# Patient Record
Sex: Female | Born: 1950 | Race: Black or African American | Hispanic: No | Smoking: Former smoker
Health system: Southern US, Community
[De-identification: ages and names within clinical notes are randomized; demographics above are authoritative.]

## PROBLEM LIST (undated history)

## (undated) DIAGNOSIS — E042 Nontoxic multinodular goiter: Secondary | ICD-10-CM

## (undated) DIAGNOSIS — M1990 Unspecified osteoarthritis, unspecified site: Secondary | ICD-10-CM

## (undated) DIAGNOSIS — K5790 Diverticulosis of intestine, part unspecified, without perforation or abscess without bleeding: Secondary | ICD-10-CM

## (undated) DIAGNOSIS — J449 Chronic obstructive pulmonary disease, unspecified: Secondary | ICD-10-CM

## (undated) DIAGNOSIS — F419 Anxiety disorder, unspecified: Secondary | ICD-10-CM

## (undated) DIAGNOSIS — F32A Depression, unspecified: Secondary | ICD-10-CM

## (undated) DIAGNOSIS — R600 Localized edema: Secondary | ICD-10-CM

## (undated) DIAGNOSIS — F329 Major depressive disorder, single episode, unspecified: Secondary | ICD-10-CM

## (undated) DIAGNOSIS — I10 Essential (primary) hypertension: Secondary | ICD-10-CM

## (undated) DIAGNOSIS — K635 Polyp of colon: Secondary | ICD-10-CM

## (undated) DIAGNOSIS — E785 Hyperlipidemia, unspecified: Secondary | ICD-10-CM

## (undated) HISTORY — DX: Polyp of colon: K63.5

## (undated) HISTORY — DX: Unspecified osteoarthritis, unspecified site: M19.90

## (undated) HISTORY — DX: Hyperlipidemia, unspecified: E78.5

## (undated) HISTORY — DX: Nontoxic multinodular goiter: E04.2

## (undated) HISTORY — DX: Chronic obstructive pulmonary disease, unspecified: J44.9

## (undated) HISTORY — PX: PARTIAL HYSTERECTOMY: SHX80

## (undated) HISTORY — DX: Depression, unspecified: F32.A

## (undated) HISTORY — PX: COLONOSCOPY: SHX174

## (undated) HISTORY — DX: Major depressive disorder, single episode, unspecified: F32.9

## (undated) HISTORY — DX: Anxiety disorder, unspecified: F41.9

## (undated) HISTORY — DX: Diverticulosis of intestine, part unspecified, without perforation or abscess without bleeding: K57.90

## (undated) HISTORY — DX: Localized edema: R60.0

---

## 1999-11-27 ENCOUNTER — Encounter: Payer: Self-pay | Admitting: Emergency Medicine

## 1999-11-27 ENCOUNTER — Emergency Department (HOSPITAL_COMMUNITY): Admission: EM | Admit: 1999-11-27 | Discharge: 1999-11-27 | Payer: Self-pay

## 2000-01-10 ENCOUNTER — Other Ambulatory Visit: Admission: RE | Admit: 2000-01-10 | Discharge: 2000-01-10 | Payer: Self-pay | Admitting: Obstetrics and Gynecology

## 2003-06-07 ENCOUNTER — Emergency Department (HOSPITAL_COMMUNITY): Admission: EM | Admit: 2003-06-07 | Discharge: 2003-06-07 | Payer: Self-pay | Admitting: Emergency Medicine

## 2003-06-18 ENCOUNTER — Emergency Department (HOSPITAL_COMMUNITY): Admission: EM | Admit: 2003-06-18 | Discharge: 2003-06-18 | Payer: Self-pay | Admitting: Emergency Medicine

## 2004-03-24 ENCOUNTER — Other Ambulatory Visit: Admission: RE | Admit: 2004-03-24 | Discharge: 2004-03-24 | Payer: Self-pay | Admitting: Gynecology

## 2004-04-06 ENCOUNTER — Ambulatory Visit (HOSPITAL_COMMUNITY): Admission: RE | Admit: 2004-04-06 | Discharge: 2004-04-06 | Payer: Self-pay | Admitting: Gynecology

## 2004-04-10 ENCOUNTER — Inpatient Hospital Stay (HOSPITAL_COMMUNITY): Admission: EM | Admit: 2004-04-10 | Discharge: 2004-04-11 | Payer: Self-pay | Admitting: Psychiatry

## 2004-04-11 ENCOUNTER — Ambulatory Visit: Payer: Self-pay | Admitting: Psychiatry

## 2005-01-10 ENCOUNTER — Ambulatory Visit: Payer: Self-pay | Admitting: Internal Medicine

## 2005-01-19 ENCOUNTER — Ambulatory Visit: Payer: Self-pay | Admitting: *Deleted

## 2005-03-28 ENCOUNTER — Ambulatory Visit: Payer: Self-pay | Admitting: Internal Medicine

## 2005-04-09 ENCOUNTER — Ambulatory Visit (HOSPITAL_COMMUNITY): Admission: RE | Admit: 2005-04-09 | Discharge: 2005-04-09 | Payer: Self-pay | Admitting: Internal Medicine

## 2005-04-24 ENCOUNTER — Ambulatory Visit: Payer: Self-pay | Admitting: Internal Medicine

## 2005-05-31 ENCOUNTER — Encounter: Admission: RE | Admit: 2005-05-31 | Discharge: 2005-05-31 | Payer: Self-pay | Admitting: Internal Medicine

## 2005-07-19 ENCOUNTER — Ambulatory Visit: Payer: Self-pay | Admitting: Family Medicine

## 2005-09-13 ENCOUNTER — Ambulatory Visit: Payer: Self-pay | Admitting: Family Medicine

## 2005-09-13 ENCOUNTER — Encounter: Payer: Self-pay | Admitting: Family Medicine

## 2005-09-20 ENCOUNTER — Ambulatory Visit: Payer: Self-pay | Admitting: Family Medicine

## 2005-12-10 ENCOUNTER — Ambulatory Visit: Payer: Self-pay | Admitting: Family Medicine

## 2005-12-11 ENCOUNTER — Ambulatory Visit (HOSPITAL_COMMUNITY): Admission: RE | Admit: 2005-12-11 | Discharge: 2005-12-11 | Payer: Self-pay | Admitting: Family Medicine

## 2005-12-20 ENCOUNTER — Ambulatory Visit: Payer: Self-pay | Admitting: Internal Medicine

## 2006-02-25 ENCOUNTER — Ambulatory Visit: Payer: Self-pay | Admitting: Internal Medicine

## 2006-04-22 ENCOUNTER — Ambulatory Visit: Payer: Self-pay | Admitting: Family Medicine

## 2006-09-25 ENCOUNTER — Encounter (INDEPENDENT_AMBULATORY_CARE_PROVIDER_SITE_OTHER): Payer: Self-pay | Admitting: *Deleted

## 2006-09-27 ENCOUNTER — Emergency Department (HOSPITAL_COMMUNITY): Admission: EM | Admit: 2006-09-27 | Discharge: 2006-09-27 | Payer: Self-pay | Admitting: Emergency Medicine

## 2006-09-30 ENCOUNTER — Telehealth (INDEPENDENT_AMBULATORY_CARE_PROVIDER_SITE_OTHER): Payer: Self-pay | Admitting: *Deleted

## 2006-10-27 ENCOUNTER — Inpatient Hospital Stay (HOSPITAL_COMMUNITY): Admission: EM | Admit: 2006-10-27 | Discharge: 2006-10-31 | Payer: Self-pay | Admitting: Emergency Medicine

## 2006-11-01 ENCOUNTER — Telehealth (INDEPENDENT_AMBULATORY_CARE_PROVIDER_SITE_OTHER): Payer: Self-pay | Admitting: *Deleted

## 2006-11-04 ENCOUNTER — Encounter (INDEPENDENT_AMBULATORY_CARE_PROVIDER_SITE_OTHER): Payer: Self-pay | Admitting: Nurse Practitioner

## 2006-11-07 DIAGNOSIS — K0520 Aggressive periodontitis, unspecified: Secondary | ICD-10-CM | POA: Insufficient documentation

## 2006-11-07 DIAGNOSIS — E785 Hyperlipidemia, unspecified: Secondary | ICD-10-CM | POA: Insufficient documentation

## 2006-11-08 ENCOUNTER — Ambulatory Visit: Payer: Self-pay | Admitting: Nurse Practitioner

## 2006-11-08 DIAGNOSIS — J40 Bronchitis, not specified as acute or chronic: Secondary | ICD-10-CM | POA: Insufficient documentation

## 2006-11-08 DIAGNOSIS — J45909 Unspecified asthma, uncomplicated: Secondary | ICD-10-CM | POA: Insufficient documentation

## 2007-03-06 ENCOUNTER — Ambulatory Visit: Payer: Self-pay | Admitting: Internal Medicine

## 2007-03-06 DIAGNOSIS — J441 Chronic obstructive pulmonary disease with (acute) exacerbation: Secondary | ICD-10-CM | POA: Insufficient documentation

## 2007-03-11 ENCOUNTER — Ambulatory Visit: Payer: Self-pay | Admitting: Internal Medicine

## 2007-04-04 ENCOUNTER — Encounter (INDEPENDENT_AMBULATORY_CARE_PROVIDER_SITE_OTHER): Payer: Self-pay | Admitting: Family Medicine

## 2007-07-04 ENCOUNTER — Other Ambulatory Visit: Admission: RE | Admit: 2007-07-04 | Discharge: 2007-07-04 | Payer: Self-pay | Admitting: Family Medicine

## 2007-07-18 ENCOUNTER — Ambulatory Visit (HOSPITAL_COMMUNITY): Admission: RE | Admit: 2007-07-18 | Discharge: 2007-07-18 | Payer: Self-pay | Admitting: Family Medicine

## 2008-10-29 ENCOUNTER — Encounter: Admission: RE | Admit: 2008-10-29 | Discharge: 2008-10-29 | Payer: Self-pay | Admitting: Family Medicine

## 2009-05-19 ENCOUNTER — Other Ambulatory Visit: Admission: RE | Admit: 2009-05-19 | Discharge: 2009-05-19 | Payer: Self-pay | Admitting: Family Medicine

## 2009-10-13 LAB — HM COLONOSCOPY

## 2010-01-29 ENCOUNTER — Encounter: Payer: Self-pay | Admitting: Internal Medicine

## 2010-05-23 NOTE — H&P (Signed)
NAMEGAYNELLE, PASTRANA            ACCOUNT NO.:  [id number]   MEDICAL RECORD NO.:  [id number]          PATIENT TYPE:  INP   LOCATION:  1224                         FACILITY:  [facility]   PHYSICIAN:  Beckey Rutter, MD  DATE OF BIRTH:  12/13/1950   DATE OF ADMISSION:  10/27/2006  DATE OF DISCHARGE:                              HISTORY & PHYSICAL   CHIEF COMPLAINT ON PRESENTATION:  Shortness of breath.   PRIMARY CARE PHYSICIAN:  [facility].   HISTORY OF PRESENT ILLNESS:  This is a 60 year old smoker who presented  today with shortness of breath started since morning associated with  cough, slightly productive.  The patient was in her usual state of  health yesterday but since morning, the shortness of breath started and  progressively worsened.  The patient denied fever, headache, nausea,  vomiting, or similar condition.  The patient stated that she has been a  smoker for the most of her adult life.   PAST MEDICAL HISTORY:  As per history, not known to have significant  medical problems.   FAMILY HISTORY:  Noncontributory.   SOCIAL HISTORY:  The patient is a current smoker, and she has been a  smoker for more than 25 years.  She denied drug abuse or ethanol abuse.   MEDICATION:  She is not taking any medications.   ALLERGIES:  NO KNOWN DRUG ALLERGIES.   REVIEW OF SYSTEMS:  Admitted to have mild headache after she started  coughing.  Denied abdominal pain, nausea, or significant loss of weight.   PHYSICAL EXAMINATION:  VITAL SIGNS:  Blood pressure 133/105, heart rate  is 111, respiratory rate 28, temperature is 97.1.  HEENT:  Atraumatic, normocephalic.  Eyes:  PERRL.  Mouth moist.  No  ulcer.  NECK:  Supple.  No JVD.  LUNGS:  Bilateral expiratory and inspiratory wheezes and some  crepitation.  Precordium:  First second heart sounds audible.  No added  sounds.  ABDOMEN:  Soft, nontender.  Bowel sounds present.  EXTREMITIES:  No lower extremity edema.  NEUROLOGIC:  The  patient is alert, oriented, and giving history, moving  all of her extremities spontaneously.   LABORATORY DATA:  Urinalysis showing clear urine, negative nitrates or  leukocyte esterase.  CBG and BMET pending.   Chest x-ray done today, preliminary report reading COPD, no active  cardiopulmonary disease.   ASSESSMENT AND PLAN:  This is 60 year old smoker who comes in today with  severe shortness of breath with evidence of wheezes and crepitation on  examination, likely chronic obstructive pulmonary disease picture.   PLAN:  1. The patient will be admitted for further assessment and management.  2. Will start the patient on Solu-Medrol IV.  3. Will continue the nebulizer treatment.  4. We will start the patient on Avelox.  5. Will consider Lovenox for DVT prophylaxis.  6. Smoking cessation.  The patient is already counseled.  Will      consider in-hospital and a smoke cessation package.      Beckey Rutter, MD  Electronically Signed     EME/MEDQ  D:  10/27/2006  T:  10/28/2006  Job:  [id number] 
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
## 2010-05-26 NOTE — Discharge Summary (Signed)
NAMEOCEAN, KEARLEY            ACCOUNT NO.:  [id number]   MEDICAL RECORD NO.:  [id number]          PATIENT TYPE:  INP   LOCATION:  1343                         FACILITY:  [facility]   PHYSICIAN:  Lonia Blood, M.D.      DATE OF BIRTH:  [birth year]   DATE OF ADMISSION:  10/27/2006  DATE OF DISCHARGE:  10/31/2006                               DISCHARGE SUMMARY   PRIMARY CARE PHYSICIAN:  [facility].   DISCHARGE DIAGNOSES:  1. Acute chronic obstructive pulmonary disease exacerbation.  2. Tobacco abuse.  3. Anxiety disorder.   DISCHARGE MEDICATIONS:  1. Steroid taper with prednisone.  2. Spiriva inhaler one capsule daily.  3. Albuterol MDI as needed.  4. Levaquin 250 mg daily x3 days.   FOLLOW UP:  The patient was to follow up at [facility]  within a week after discharge.  [facility]   PROCEDURE:  Chest x-ray on September 19, that showed no acute  cardiopulmonary disease.   CONSULTATIONS:  None.   HISTORY OF PRESENT ILLNESS:  Please refer to dictated history and  physical on admission by Dr. Tamsen Roers.  In short, however, this is a 60-  year-old female with known tobacco history that presented with severe  shortness of breath.  The patient was using extra muscles for  respiration on admission.  She was found to be slightly hypoxic.  Chest  x-ray was negative for any pneumonia, but the patient was wheezing.  She  was subsequently admitted with a diagnosis of COPD exacerbation.   HOSPITAL COURSE:  Problem 1.  ACUTE CHRONIC OBSTRUCTIVE PULMONARY  DISEASE EXACERBATION:  The patient was admitted, placed on nebulizers,  IV steroids and IV antibiotics.  She seemed to have responded to the  treatment adequately.  By the second day, her wheezing was gone.  She  was counseled also due to some anxiety.  The patient was sent home on  nebulizers steroids and antibiotics.  She was follow up at [facility]  for further management.   Problem 2.  TOBACCO ABUSE:  The patient was counseled given some  nicotine patch in the hospital and she understands she needs to quit for  the sake of her lungs.   Problem 3.  ANXIETY DISORDER:  Again, she was counseled.  She is not on  any specific medications and she seemed to be doing fine.  Further  treatment will be at [facility].      Lonia Blood, M.D.  Electronically Signed     LG/MEDQ  D:  12/01/2006  T:  12/02/2006  Job:  [id number]
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
## 2010-10-18 LAB — LIPID PANEL
Cholesterol: 167
Total CHOL/HDL Ratio: 2.9
VLDL: 5

## 2010-10-18 LAB — URINALYSIS, ROUTINE W REFLEX MICROSCOPIC
Bilirubin Urine: NEGATIVE
Ketones, ur: NEGATIVE
Protein, ur: NEGATIVE
Urobilinogen, UA: 0.2
pH: 5

## 2010-10-18 LAB — BLOOD GAS, ARTERIAL
Acid-base deficit: 4.7 — ABNORMAL HIGH
Delivery systems: POSITIVE
O2 Saturation: 97.4
Patient temperature: 97.6
Patient temperature: 98.6
TCO2: 19.6
TCO2: 23.5
pH, Arterial: 7.288 — ABNORMAL LOW
pO2, Arterial: 97.7

## 2010-10-18 LAB — PROTIME-INR
INR: 1
INR: 1
Prothrombin Time: 13.7
Prothrombin Time: 13.8

## 2010-10-18 LAB — BASIC METABOLIC PANEL
BUN: 9
CO2: 28
Calcium: 9.2
Calcium: 9.4
Creatinine, Ser: 1.02
GFR calc Af Amer: >60
GFR calc non Af Amer: 56 — ABNORMAL LOW
Glucose, Bld: 111 — ABNORMAL HIGH
Potassium: 3.3 — ABNORMAL LOW
Potassium: 4.4

## 2010-10-18 LAB — CBC
Hemoglobin: 11.9 — ABNORMAL LOW
Hemoglobin: 12
Hemoglobin: 13.4
MCHC: 33.8
MCV: 92.5
Platelets: 190
Platelets: 208
Platelets: 214
RBC: 4.29
RDW: 15 — ABNORMAL HIGH

## 2010-10-18 LAB — CULTURE, BLOOD (ROUTINE X 2): Culture: NO GROWTH

## 2010-10-18 LAB — MAGNESIUM: Magnesium: 1.8

## 2014-04-22 ENCOUNTER — Other Ambulatory Visit: Payer: Self-pay | Admitting: Family Medicine

## 2014-04-22 DIAGNOSIS — E049 Nontoxic goiter, unspecified: Secondary | ICD-10-CM | POA: Insufficient documentation

## 2014-04-22 DIAGNOSIS — E010 Iodine-deficiency related diffuse (endemic) goiter: Secondary | ICD-10-CM

## 2014-04-30 ENCOUNTER — Ambulatory Visit
Admission: RE | Admit: 2014-04-30 | Discharge: 2014-04-30 | Disposition: A | Discharge status: Home / Self Care | Payer: Self-pay | Source: Ambulatory Visit | Attending: Family Medicine | Admitting: Family Medicine

## 2014-04-30 DIAGNOSIS — E010 Iodine-deficiency related diffuse (endemic) goiter: Secondary | ICD-10-CM

## 2014-05-21 ENCOUNTER — Ambulatory Visit (INDEPENDENT_AMBULATORY_CARE_PROVIDER_SITE_OTHER): Payer: BLUE CROSS/BLUE SHIELD | Admitting: Internal Medicine

## 2014-05-21 ENCOUNTER — Encounter: Payer: Self-pay | Admitting: Internal Medicine

## 2014-05-21 VITALS — BP 108/68 | HR 72 | Temp 98.2°F | Resp 12 | Ht 67.0 in | Wt 169.2 lb

## 2014-05-21 DIAGNOSIS — E042 Nontoxic multinodular goiter: Secondary | ICD-10-CM
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
## 2014-05-21 NOTE — Patient Instructions (Signed)
Please schedule the thyroid biopsies in [facility].  Please return in 1 year.  Thyroid Biopsy The thyroid gland is a butterfly-shaped gland situated in the front of the neck. It produces hormones which affect metabolism, growth and development, and body temperature. A thyroid biopsy is a procedure in which small samples of tissue or fluid are removed from the thyroid gland or mass and examined under a microscope. This test is done to determine the cause of thyroid problems, such as infection, cancer, or other thyroid problems. There are 2 ways to obtain samples: 1. Fine needle biopsy. Samples are removed using a thin needle inserted through the skin and into the thyroid gland or mass. 2. Open biopsy. Samples are removed after a cut (incision) is made through the skin. LET YOUR CAREGIVER KNOW ABOUT:   Allergies.  Medications taken including herbs, eye drops, over-the-counter medications, and creams.  Use of steroids (by mouth or creams).  Previous problems with anesthetics or numbing medicine.  Possibility of pregnancy, if this applies.  History of blood clots (thrombophlebitis).  History of bleeding or blood problems.  Previous surgery.  Other health problems. RISKS AND COMPLICATIONS  Bleeding from the site. The risk of bleeding is higher if you have a bleeding disorder or are taking any blood thinning medications (anticoagulants).  Infection.  Injury to structures near the thyroid gland. BEFORE THE PROCEDURE  This is a procedure that can be done as an outpatient. Confirm the time that you need to arrive for your procedure. Confirm whether there is a need to fast or withhold any medications. A blood sample may be done to determine your blood clotting time. Medicine may be given to help you relax (sedative). PROCEDURE Fine needle biopsy. You will be awake during the procedure. You may be asked to lie on your back with your head tipped backward to extend your neck. Let  your caregiver know if you cannot tolerate the positioning. An area on your neck will be cleansed. A needle is inserted through the skin of your neck. You may feel a mild discomfort during this procedure. You may be asked to avoid coughing, talking, swallowing, or making sounds during some portions of the procedure. The needle is withdrawn once tissue or fluid samples have been removed. Pressure may be applied to the neck to reduce swelling and ensure that bleeding has stopped. The samples will be sent for examination.  Open biopsy. You will be given general anesthesia. You will be asleep during the procedure. An incision is made in your neck. A sample of thyroid tissue or the mass is removed. The tissue sample or mass will be sent for examination. The sample or mass may be examined during the biopsy. If the sample or mass contains cancer cells, some or all of the thyroid gland may be removed. The incision is closed with stitches. AFTER THE PROCEDURE  Your recovery will be assessed and monitored. If there are no problems, as an outpatient, you should be able to go home shortly after the procedure. If you had a fine needle biopsy:  You may have soreness at the biopsy site for 1 to 2 days. If you had an open biopsy:   You may have soreness at the biopsy site for 3 to 4 days.  You may have a hoarse voice or sore throat for 1 to 2 days. Obtaining the Test Results It is your responsibility to obtain your test results. Do not assume everything is normal if you have not heard  from your caregiver or the medical facility. It is important for you to follow up on all of your test results. HOME CARE INSTRUCTIONS   Keeping your head raised on a pillow when you are lying down may ease biopsy site discomfort.  Supporting the back of your head and neck with both hands as you sit up from a lying position may ease biopsy site discomfort.  Only take over-the-counter or prescription medicines for pain, discomfort,  or fever as directed by your caregiver.  Throat lozenges or gargling with warm salt water may help to soothe a sore throat. SEEK IMMEDIATE MEDICAL CARE IF:   You have severe bleeding from the biopsy site.  You have difficulty swallowing.  You have a fever.  You have increased pain, swelling, redness, or warmth at the biopsy site.  You notice pus coming from the biopsy site.  You have swollen glands (lymph nodes) in your neck. Document Released: 10/22/2006 Document Revised: 04/21/2012 Document Reviewed: 03/19/2013 [facility] Patient Information 2015 [place], [place]. This information is not intended to replace advice given to you by your health care provider. Make sure you discuss any questions you have with your health care provider.

## 2014-05-24 ENCOUNTER — Telehealth: Payer: Self-pay | Admitting: Internal Medicine

## 2014-05-24 NOTE — Telephone Encounter (Signed)
Please read message below and advise.  

## 2014-05-24 NOTE — Telephone Encounter (Signed)
She has thyroid Bxs at the end of the mo >> I will d/w her after results are back

## 2014-05-24 NOTE — Telephone Encounter (Signed)
Called pt and advised her per Dr Gherghe's message below. Pt voiced understanding.  

## 2014-05-24 NOTE — Telephone Encounter (Signed)
Pt is questioning enlarged thyroid biopsy in one year what is she supposed to do about the enlarged thyroid. What are the next steps over the next year?

## 2014-06-08 ENCOUNTER — Other Ambulatory Visit (HOSPITAL_COMMUNITY)
Admission: RE | Admit: 2014-06-08 | Discharge: 2014-06-08 | Disposition: A | Discharge status: Home / Self Care | Payer: BLUE CROSS/BLUE SHIELD | Source: Ambulatory Visit | Attending: Interventional Radiology | Admitting: Interventional Radiology

## 2014-06-08 ENCOUNTER — Ambulatory Visit
Admission: RE | Admit: 2014-06-08 | Discharge: 2014-06-08 | Disposition: A | Discharge status: Home / Self Care | Payer: BLUE CROSS/BLUE SHIELD | Source: Ambulatory Visit | Attending: Internal Medicine | Admitting: Internal Medicine

## 2014-06-08 DIAGNOSIS — E041 Nontoxic single thyroid nodule: Secondary | ICD-10-CM | POA: Insufficient documentation

## 2014-06-08 DIAGNOSIS — E042 Nontoxic multinodular goiter: Secondary | ICD-10-CM

## 2014-06-09 NOTE — Addendum Note (Signed)
Addended by: Philemon Kingdom on: 06/09/2014 07:26 PM   Modules accepted: Level of Service

## 2014-06-10 ENCOUNTER — Telehealth: Payer: Self-pay | Admitting: *Deleted

## 2014-06-10 NOTE — Telephone Encounter (Signed)
Pt called back after hearing her voice message about her bx results (benign). Pt stated that her thyroid nodule is enlarging, she questioned when should she be concerned and what should she do at this point? Please advise.

## 2014-06-10 NOTE — Telephone Encounter (Signed)
For a thyroid nodule that is not cancerous, we can consider surgery, but this is a subjective decision, as there is no medical indication to take it out now. If she believes that the nodule is impairing her swallowing or breathing, we can definitely refer her to surgery.

## 2014-06-10 NOTE — Telephone Encounter (Signed)
Called pt and advised her per Dr Arman Filter message. Pt voiced that she would wait until she felt that it was an issue. Be advised.

## 2015-04-25 DIAGNOSIS — J309 Allergic rhinitis, unspecified: Secondary | ICD-10-CM | POA: Diagnosis not present

## 2015-04-25 DIAGNOSIS — E7800 Pure hypercholesterolemia, unspecified: Secondary | ICD-10-CM | POA: Diagnosis not present

## 2015-04-25 DIAGNOSIS — J449 Chronic obstructive pulmonary disease, unspecified: Secondary | ICD-10-CM | POA: Diagnosis not present

## 2015-04-25 DIAGNOSIS — Z23 Encounter for immunization: Secondary | ICD-10-CM | POA: Diagnosis not present

## 2015-04-25 DIAGNOSIS — Z0000 Encounter for general adult medical examination without abnormal findings: Secondary | ICD-10-CM | POA: Diagnosis not present

## 2015-04-25 DIAGNOSIS — E042 Nontoxic multinodular goiter: Secondary | ICD-10-CM | POA: Diagnosis not present

## 2015-05-09 ENCOUNTER — Other Ambulatory Visit: Payer: Self-pay

## 2015-05-10 DIAGNOSIS — Z1231 Encounter for screening mammogram for malignant neoplasm of breast: Secondary | ICD-10-CM | POA: Diagnosis not present

## 2015-05-23 ENCOUNTER — Ambulatory Visit: Payer: Self-pay | Admitting: Internal Medicine

## 2015-05-23 DIAGNOSIS — Z0289 Encounter for other administrative examinations: Secondary | ICD-10-CM

## 2015-08-08 DIAGNOSIS — H5711 Ocular pain, right eye: Secondary | ICD-10-CM | POA: Diagnosis not present

## 2015-08-08 DIAGNOSIS — M79629 Pain in unspecified upper arm: Secondary | ICD-10-CM | POA: Diagnosis not present

## 2015-09-26 DIAGNOSIS — H524 Presbyopia: Secondary | ICD-10-CM | POA: Diagnosis not present

## 2015-09-26 DIAGNOSIS — H2513 Age-related nuclear cataract, bilateral: Secondary | ICD-10-CM | POA: Diagnosis not present

## 2015-10-14 DIAGNOSIS — Z23 Encounter for immunization: Secondary | ICD-10-CM | POA: Diagnosis not present

## 2016-02-06 ENCOUNTER — Ambulatory Visit
Admission: RE | Admit: 2016-02-06 | Discharge: 2016-02-06 | Disposition: A | Discharge status: Home / Self Care | Payer: Medicare Other | Source: Ambulatory Visit | Attending: Family Medicine | Admitting: Family Medicine

## 2016-02-06 ENCOUNTER — Other Ambulatory Visit: Payer: Self-pay | Admitting: Family Medicine

## 2016-02-06 DIAGNOSIS — R7611 Nonspecific reaction to tuberculin skin test without active tuberculosis: Secondary | ICD-10-CM

## 2016-05-22 DIAGNOSIS — H1131 Conjunctival hemorrhage, right eye: Secondary | ICD-10-CM | POA: Diagnosis not present

## 2016-05-22 DIAGNOSIS — J3089 Other allergic rhinitis: Secondary | ICD-10-CM | POA: Diagnosis not present

## 2016-08-07 DIAGNOSIS — E7800 Pure hypercholesterolemia, unspecified: Secondary | ICD-10-CM | POA: Diagnosis not present

## 2016-08-07 DIAGNOSIS — E042 Nontoxic multinodular goiter: Secondary | ICD-10-CM | POA: Diagnosis not present

## 2016-08-07 DIAGNOSIS — Z0000 Encounter for general adult medical examination without abnormal findings: Secondary | ICD-10-CM | POA: Diagnosis not present

## 2016-08-07 DIAGNOSIS — J449 Chronic obstructive pulmonary disease, unspecified: Secondary | ICD-10-CM | POA: Diagnosis not present

## 2016-08-07 DIAGNOSIS — Z1159 Encounter for screening for other viral diseases: Secondary | ICD-10-CM | POA: Diagnosis not present

## 2016-08-07 DIAGNOSIS — J309 Allergic rhinitis, unspecified: Secondary | ICD-10-CM | POA: Diagnosis not present

## 2016-08-07 DIAGNOSIS — Z1389 Encounter for screening for other disorder: Secondary | ICD-10-CM | POA: Diagnosis not present

## 2016-10-16 DIAGNOSIS — Z23 Encounter for immunization: Secondary | ICD-10-CM | POA: Diagnosis not present

## 2017-01-19 DIAGNOSIS — F331 Major depressive disorder, recurrent, moderate: Secondary | ICD-10-CM | POA: Diagnosis not present

## 2017-01-19 DIAGNOSIS — F4312 Post-traumatic stress disorder, chronic: Secondary | ICD-10-CM | POA: Diagnosis not present

## 2017-01-24 DIAGNOSIS — F331 Major depressive disorder, recurrent, moderate: Secondary | ICD-10-CM | POA: Diagnosis not present

## 2017-01-24 DIAGNOSIS — F4312 Post-traumatic stress disorder, chronic: Secondary | ICD-10-CM | POA: Diagnosis not present

## 2017-01-26 DIAGNOSIS — F331 Major depressive disorder, recurrent, moderate: Secondary | ICD-10-CM | POA: Diagnosis not present

## 2017-01-26 DIAGNOSIS — F4312 Post-traumatic stress disorder, chronic: Secondary | ICD-10-CM | POA: Diagnosis not present

## 2017-01-31 DIAGNOSIS — F4312 Post-traumatic stress disorder, chronic: Secondary | ICD-10-CM | POA: Diagnosis not present

## 2017-01-31 DIAGNOSIS — F331 Major depressive disorder, recurrent, moderate: Secondary | ICD-10-CM | POA: Diagnosis not present

## 2017-02-02 DIAGNOSIS — F4312 Post-traumatic stress disorder, chronic: Secondary | ICD-10-CM | POA: Diagnosis not present

## 2017-02-02 DIAGNOSIS — F331 Major depressive disorder, recurrent, moderate: Secondary | ICD-10-CM | POA: Diagnosis not present

## 2017-02-07 DIAGNOSIS — F331 Major depressive disorder, recurrent, moderate: Secondary | ICD-10-CM | POA: Diagnosis not present

## 2017-02-07 DIAGNOSIS — F4312 Post-traumatic stress disorder, chronic: Secondary | ICD-10-CM | POA: Diagnosis not present

## 2017-02-09 DIAGNOSIS — F4312 Post-traumatic stress disorder, chronic: Secondary | ICD-10-CM | POA: Diagnosis not present

## 2017-02-09 DIAGNOSIS — F331 Major depressive disorder, recurrent, moderate: Secondary | ICD-10-CM | POA: Diagnosis not present

## 2017-02-13 DIAGNOSIS — R413 Other amnesia: Secondary | ICD-10-CM | POA: Diagnosis not present

## 2017-02-13 DIAGNOSIS — Z131 Encounter for screening for diabetes mellitus: Secondary | ICD-10-CM | POA: Diagnosis not present

## 2017-02-14 DIAGNOSIS — F4312 Post-traumatic stress disorder, chronic: Secondary | ICD-10-CM | POA: Diagnosis not present

## 2017-02-14 DIAGNOSIS — F331 Major depressive disorder, recurrent, moderate: Secondary | ICD-10-CM | POA: Diagnosis not present

## 2017-02-19 DIAGNOSIS — F4312 Post-traumatic stress disorder, chronic: Secondary | ICD-10-CM | POA: Diagnosis not present

## 2017-02-19 DIAGNOSIS — F331 Major depressive disorder, recurrent, moderate: Secondary | ICD-10-CM | POA: Diagnosis not present

## 2017-02-20 DIAGNOSIS — J069 Acute upper respiratory infection, unspecified: Secondary | ICD-10-CM | POA: Diagnosis not present

## 2017-02-20 DIAGNOSIS — R040 Epistaxis: Secondary | ICD-10-CM | POA: Diagnosis not present

## 2017-02-23 DIAGNOSIS — F331 Major depressive disorder, recurrent, moderate: Secondary | ICD-10-CM | POA: Diagnosis not present

## 2017-02-23 DIAGNOSIS — F4312 Post-traumatic stress disorder, chronic: Secondary | ICD-10-CM | POA: Diagnosis not present

## 2017-02-28 DIAGNOSIS — F331 Major depressive disorder, recurrent, moderate: Secondary | ICD-10-CM | POA: Diagnosis not present

## 2017-02-28 DIAGNOSIS — F4312 Post-traumatic stress disorder, chronic: Secondary | ICD-10-CM | POA: Diagnosis not present

## 2017-03-02 DIAGNOSIS — F4312 Post-traumatic stress disorder, chronic: Secondary | ICD-10-CM | POA: Diagnosis not present

## 2017-03-02 DIAGNOSIS — F331 Major depressive disorder, recurrent, moderate: Secondary | ICD-10-CM | POA: Diagnosis not present

## 2017-03-08 DIAGNOSIS — F331 Major depressive disorder, recurrent, moderate: Secondary | ICD-10-CM | POA: Diagnosis not present

## 2017-03-08 DIAGNOSIS — F4312 Post-traumatic stress disorder, chronic: Secondary | ICD-10-CM | POA: Diagnosis not present

## 2017-03-09 DIAGNOSIS — F331 Major depressive disorder, recurrent, moderate: Secondary | ICD-10-CM | POA: Diagnosis not present

## 2017-03-09 DIAGNOSIS — F4312 Post-traumatic stress disorder, chronic: Secondary | ICD-10-CM | POA: Diagnosis not present

## 2017-03-16 DIAGNOSIS — F4312 Post-traumatic stress disorder, chronic: Secondary | ICD-10-CM | POA: Diagnosis not present

## 2017-03-16 DIAGNOSIS — F331 Major depressive disorder, recurrent, moderate: Secondary | ICD-10-CM | POA: Diagnosis not present

## 2017-03-22 DIAGNOSIS — F331 Major depressive disorder, recurrent, moderate: Secondary | ICD-10-CM | POA: Diagnosis not present

## 2017-03-22 DIAGNOSIS — F4312 Post-traumatic stress disorder, chronic: Secondary | ICD-10-CM | POA: Diagnosis not present

## 2017-03-23 DIAGNOSIS — F331 Major depressive disorder, recurrent, moderate: Secondary | ICD-10-CM | POA: Diagnosis not present

## 2017-03-23 DIAGNOSIS — F4312 Post-traumatic stress disorder, chronic: Secondary | ICD-10-CM | POA: Diagnosis not present

## 2017-03-26 DIAGNOSIS — F331 Major depressive disorder, recurrent, moderate: Secondary | ICD-10-CM | POA: Diagnosis not present

## 2017-03-26 DIAGNOSIS — F4312 Post-traumatic stress disorder, chronic: Secondary | ICD-10-CM | POA: Diagnosis not present

## 2017-03-30 DIAGNOSIS — F4312 Post-traumatic stress disorder, chronic: Secondary | ICD-10-CM | POA: Diagnosis not present

## 2017-03-30 DIAGNOSIS — F331 Major depressive disorder, recurrent, moderate: Secondary | ICD-10-CM | POA: Diagnosis not present

## 2017-04-04 DIAGNOSIS — F4312 Post-traumatic stress disorder, chronic: Secondary | ICD-10-CM | POA: Diagnosis not present

## 2017-04-04 DIAGNOSIS — F331 Major depressive disorder, recurrent, moderate: Secondary | ICD-10-CM | POA: Diagnosis not present

## 2017-04-10 ENCOUNTER — Encounter: Payer: Self-pay | Admitting: *Deleted

## 2017-04-10 ENCOUNTER — Encounter: Payer: Self-pay | Admitting: Neurology

## 2017-04-10 ENCOUNTER — Telehealth: Payer: Self-pay | Admitting: Neurology

## 2017-04-10 ENCOUNTER — Encounter (INDEPENDENT_AMBULATORY_CARE_PROVIDER_SITE_OTHER): Payer: Self-pay

## 2017-04-10 ENCOUNTER — Ambulatory Visit (INDEPENDENT_AMBULATORY_CARE_PROVIDER_SITE_OTHER): Payer: Medicare Other | Admitting: Neurology

## 2017-04-10 VITALS — BP 121/78 | HR 69 | Ht 66.75 in | Wt 174.0 lb

## 2017-04-10 DIAGNOSIS — R413 Other amnesia: Secondary | ICD-10-CM

## 2017-04-10 NOTE — Patient Instructions (Signed)
MRI brain

## 2017-04-10 NOTE — Progress Notes (Signed)
[facility]    Provider:  Dr Jaynee Eagles Referring Provider: Aretta Nip, MD Primary Care Physician:  Kelton Pillar, MD  CC:  Memory loss  HPI:  Pamela Roth is a 67 y.o. female here as a referral from Dr. Radene Ou for memory loss.  Past medical history depression, anxiety, high cholesterol, COPD, hyperlipidemia, multinodular goiter. She feels she has normal forgetfulness, walks into room and forgets what she need to do. 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 Difficult job. She wasn't able to grasp it quickly and was let go after 6 weeks. She didn't have good training at her job. She lives alone, she is widowed. She doesn't have family in the area, she has a church family. No significant other. She feels bored in her home, she has some depression and is seeing a therapist started a month ago just to talk out some of her issues. Friends and family haven't noticed anything. She pays her bills on time never miss, takes medication on time no loss of medication, once 9-10 months ago she left a pot of boiling water on the stove but she remembered and ran back home and turned it off in 40 minutes no damage, no falls. She can cook, no los of cooking skills, driving is fine no accidents, she got her appointment mixed up last week thought is was friday but it was Thursday this is the first time it happened. She graduated HS and has some college. Her mom has paranoid schizophrenia and dementia at 33, she didn't know her biological father. No other FHx known of dementia.  She doesn't snore, no significant fatigue during the day. She doesn't sleep well. No other focal neurologic deficits, associated symptoms, inciting events or modifiable factors.   Reviewed notes, labs and imaging from outside physicians, which showed:  Reviewed referring physician notes.  Patient complains of difficulties with her  memory.  She recently took a new job and was unable to remember the intricacies of the job and would like a within a month.  She has been noticing her focus and concentration are off, she makes a lot of notes and lists sometimes cannot remember where she put the lists.  She had one episode where she left a kettle on the stove and had to go home and get it off when she remembered.  She lives alone and does not have family nearby has friends at church.  None of her friends have noticed any problems, daughter has not noticed it.  No headache, visual changes, neurologic deficits.  Symptoms ongoing for about 6 months.  Vitamin U54, TSH and folic acid were checked B12 598, glucose normal, TSH normal 0.45.  Review of Systems: Patient complains of symptoms per HPI as well as the following symptoms: Eyelid fluttering, memory loss, insomnia, depression, anxiety, not enough sleep, decreased energy. Pertinent negatives and positives per HPI. All others negative.   Social History   Socioeconomic History  . Marital status: Widowed    Spouse name: Not on file  . Number of children: 1  . Years of education: Not on file  . Highest education level: Some college, no degree  Occupational History  . Not on file  Social Needs  . Financial resource strain: Not on file  . Food insecurity:    Worry: Not on file    Inability: Not on file  . Transportation needs:  Medical: Not on file    Non-medical: Not on file  Tobacco Use  . Smoking status: Former Smoker    Last attempt to quit: 01/09/2008    Years since quitting: 9.2  . Smokeless tobacco: Never Used  Substance and Sexual Activity  . Alcohol use: No    Alcohol/week: 0.0 oz  . Drug use: No  . Sexual activity: Not on file  Lifestyle  . Physical activity:    Days per week: Not on file    Minutes per session: Not on file  . Stress: Not on file  Relationships  . Social connections:    Talks on phone: Not on file    Gets together: Not on file    Attends  religious service: Not on file    Active member of club or organization: Not on file    Attends meetings of clubs or organizations: Not on file    Relationship status: Not on file  . Intimate partner violence:    Fear of current or ex partner: Not on file    Emotionally abused: Not on file    Physically abused: Not on file    Forced sexual activity: Not on file  Other Topics Concern  . Not on file  Social History Narrative   Lives at home alone   Right handed   Caffeine: drinks 3 cups daily    Family History  Problem Relation Age of Onset  . Schizophrenia Mother     Past Medical History:  Diagnosis Date  . Anxiety   . COPD (chronic obstructive pulmonary disease) ([place])   . Depression   . Hyperlipidemia   . Multinodular goiter     Past Surgical History:  Procedure Laterality Date  . PARTIAL HYSTERECTOMY  1980s    Current Outpatient Medications  Medication Sig Dispense Refill  . SYMBICORT 80-4.5 MCG/ACT inhaler Inhale 2 puffs into the lungs as needed.   12  . simvastatin (ZOCOR) 20 MG tablet Take 20 mg by mouth daily at 6 PM.   5   No current facility-administered medications for this visit.     Allergies as of 04/10/2017  . (No Known Allergies)    Vitals: BP 121/78 (BP Location: Right Arm, Patient Position: Sitting)   Pulse 69   Ht 5' 6.75" (1.695 m)   Wt 174 lb (78.9 kg)   BMI 27.46 kg/m  Last Weight:  Wt Readings from Last 1 Encounters:  04/10/17 174 lb (78.9 kg)   Last Height:   Ht Readings from Last 1 Encounters:  04/10/17 5' 6.75" (1.695 m)   Physical exam: Exam: Gen: NAD, conversant, well nourised, well groomed                     CV: RRR, no MRG. No Carotid Bruits. No peripheral edema, warm, nontender Eyes: Conjunctivae clear without exudates or hemorrhage  Neuro: Detailed Neurologic Exam  Speech:    Speech is normal; fluent and spontaneous with normal comprehension.  Cognition:  MMSE - Mini Mental State Exam 04/10/2017  Orientation to  time 5  Orientation to Place 5  Registration 3  Attention/ Calculation 5  Recall 2  Language- name 2 objects 2  Language- repeat 1  Language- follow 3 step command 3  Language- read & follow direction 1  Write a sentence 1  Copy design 0  Total score 28      The patient is oriented to person, place, and time;     recent and  remote memory intact;     language fluent;     normal attention, concentration,     fund of knowledge Cranial Nerves:    The pupils are equal, round, and reactive to light. The fundi are normal and spontaneous venous pulsations are present. Visual fields are full to finger confrontation. Extraocular movements are intact. Trigeminal sensation is intact and the muscles of mastication are normal. The face is symmetric. The palate elevates in the midline. Hearing intact. Voice is normal. Shoulder shrug is normal. The tongue has normal motion without fasciculations.   Coordination:    Normal finger to nose and heel to shin. Normal rapid alternating movements.   Gait:    Heel-toe and tandem gait are normal.   Motor Observation:    No asymmetry, no atrophy, and no involuntary movements noted. Tone:    Normal muscle tone.    Posture:    Posture is normal. normal erect    Strength:    Strength is V/V in the upper and lower limbs.      Sensation: intact to LT     Reflex Exam:  DTR's:    Deep tendon reflexes in the upper and lower extremities are normal bilaterally.   Toes:    The toes are downgoing bilaterally.   Clonus:    Clonus is absent.       Assessment/Plan:  67 year old here for concerns over memory. This is likely normal cognitive aging complicated by poor sleeping and depression. She was let go from her job after 6 weeks but it was a very difficult and complicated role, medical coding and billing, and appears they were unreasonable in expectations and poor training.   - However will continue to follow patient and if she progresses will order  formal neurocognitive testing.  - MRI brain due to cognitive deficits, eval for dementia or reversible causes - If she continues to progress will order Formal neurocognitive testing and consider FDG PET Scan - follow up 6 months  Orders Placed This Encounter  Procedures  . MR BRAIN WO CONTRAST   Cc: Dr. Jenell Milliner, MD  [facility] [address] [place], [place] [address]  Phone [phone] Fax [phone]

## 2017-04-10 NOTE — Telephone Encounter (Signed)
Medicare/[employer] auth: NPR Ref # Pamela Roth order sent to [facility] they will contact the pt to schedule.

## 2017-04-11 DIAGNOSIS — F4312 Post-traumatic stress disorder, chronic: Secondary | ICD-10-CM | POA: Diagnosis not present

## 2017-04-11 DIAGNOSIS — F331 Major depressive disorder, recurrent, moderate: Secondary | ICD-10-CM | POA: Diagnosis not present

## 2017-04-13 ENCOUNTER — Other Ambulatory Visit: Payer: Self-pay

## 2017-04-13 DIAGNOSIS — F4312 Post-traumatic stress disorder, chronic: Secondary | ICD-10-CM | POA: Diagnosis not present

## 2017-04-13 DIAGNOSIS — F331 Major depressive disorder, recurrent, moderate: Secondary | ICD-10-CM | POA: Diagnosis not present

## 2017-04-14 IMAGING — US US SOFT TISSUE HEAD/NECK
1 series · 14 of 25 positions shown · non-contrast
Comparison: None.

CLINICAL DATA: Thyromegaly.

EXAM:
THYROID ULTRASOUND
TECHNIQUE: Ultrasound examination of the thyroid gland and adjacent soft
tissues was performed.

[Series 1: us soft tissue head/neck · 0.08mm/px · 14 of 87 slices shown]
[im 1/87]
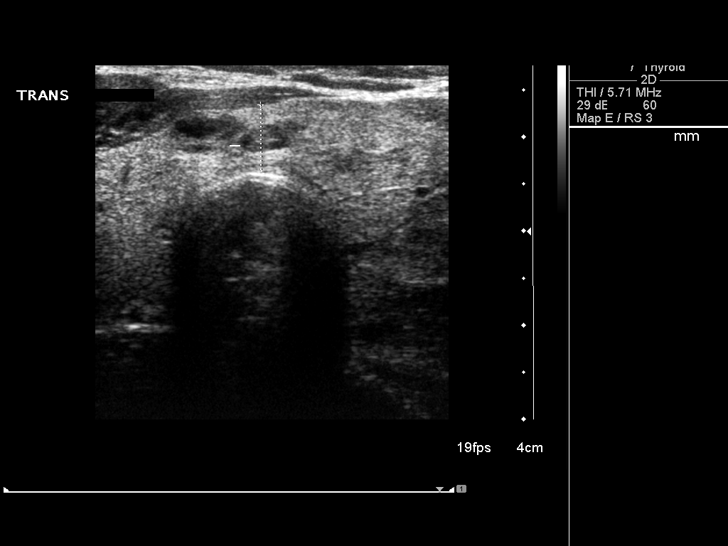
[im 8/87]
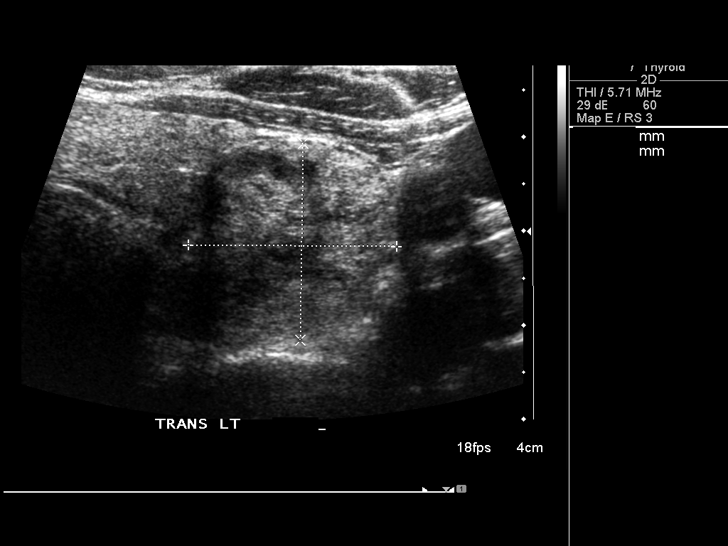
[im 15/87]
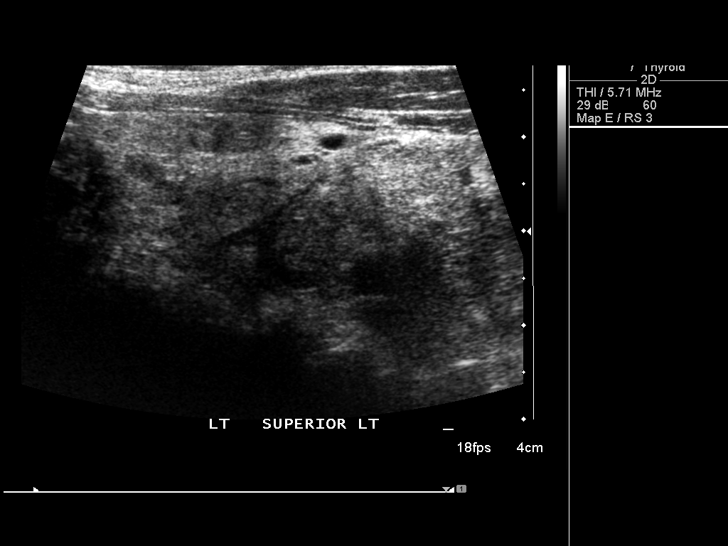
[im 22/87]
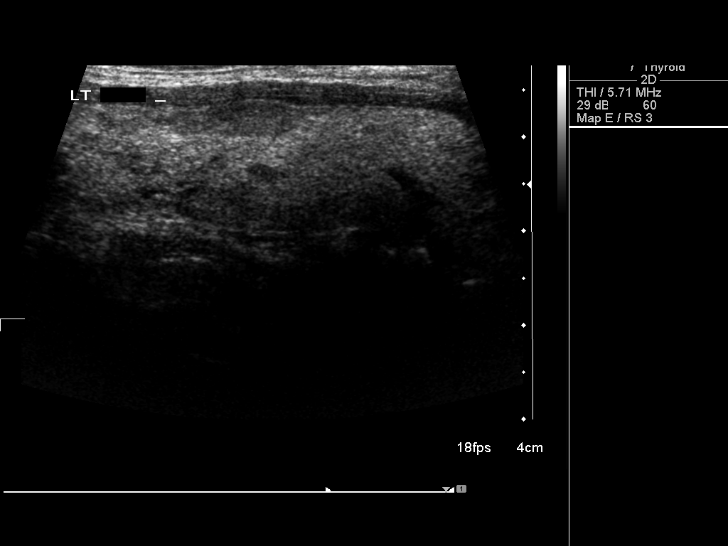
[im 29/87]
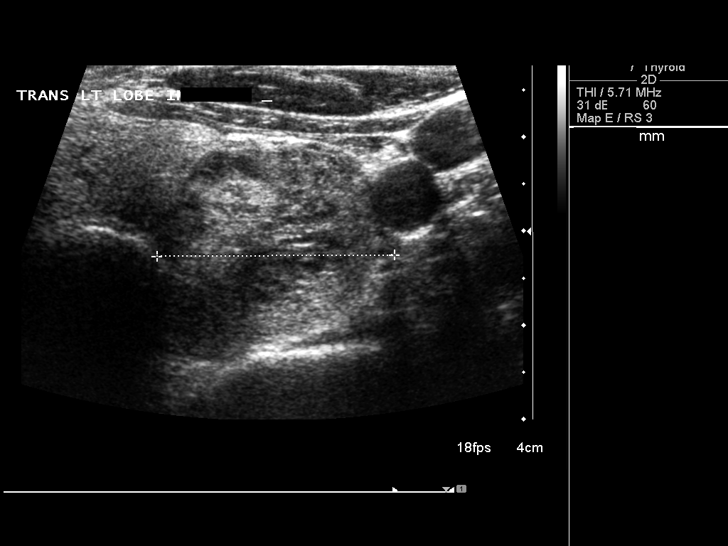
[im 33/87]
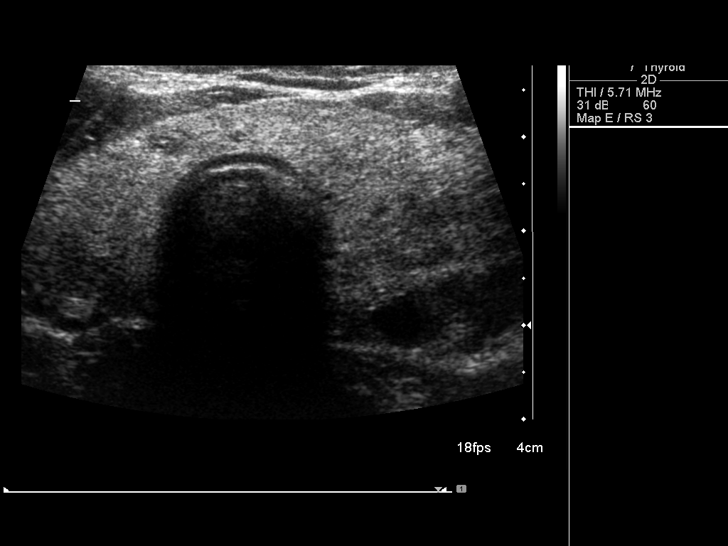
[im 40/87]
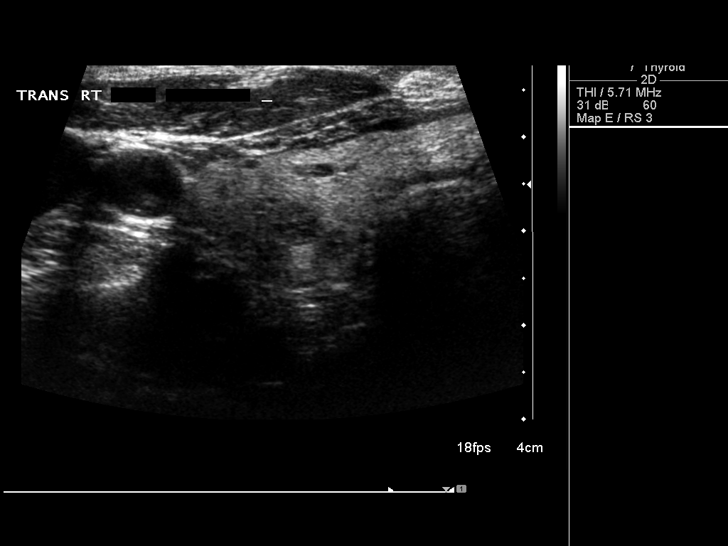
[im 47/87]
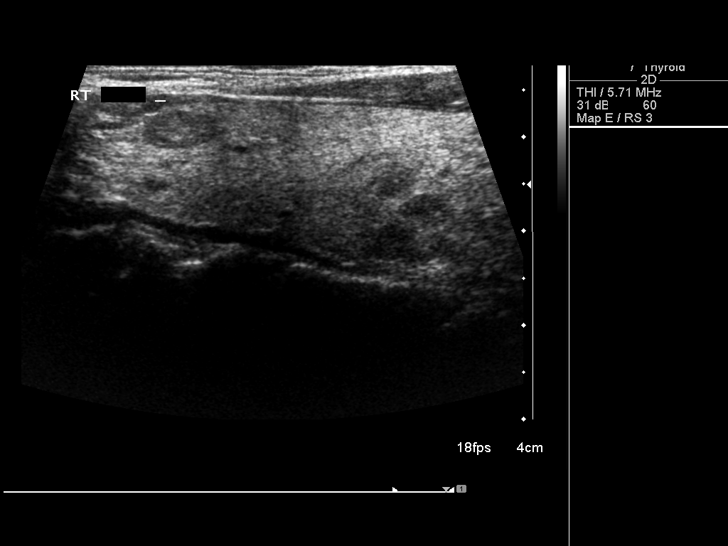
[im 54/87]
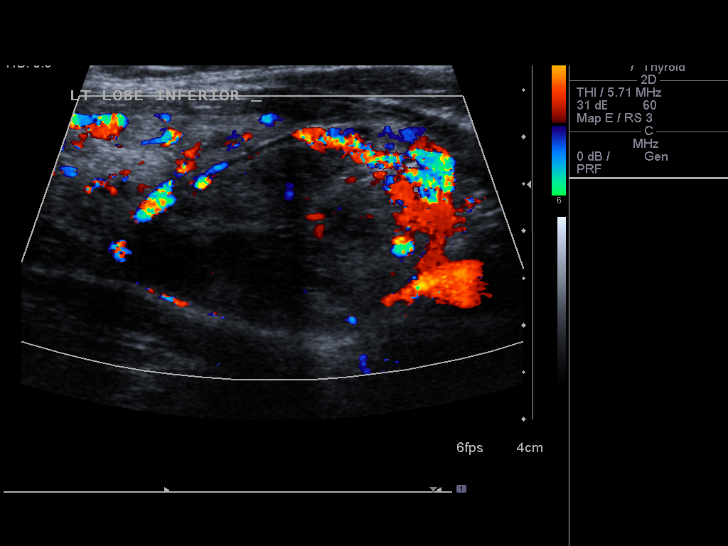
[im 58/87]
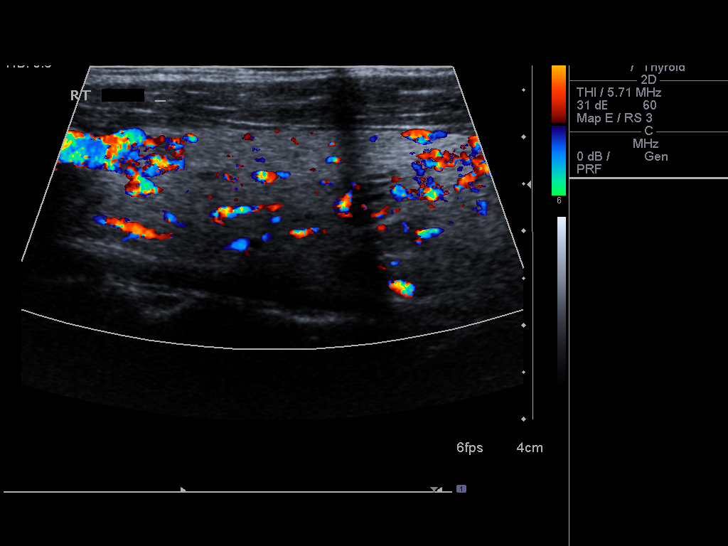
[im 65/87]
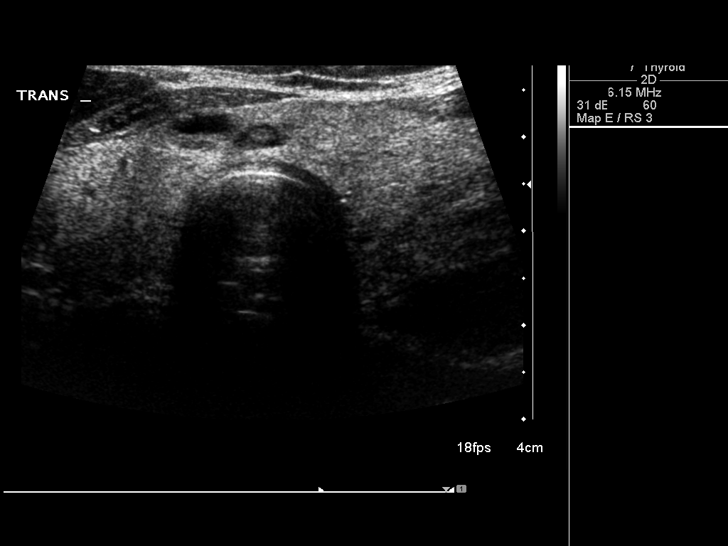
[im 72/87]
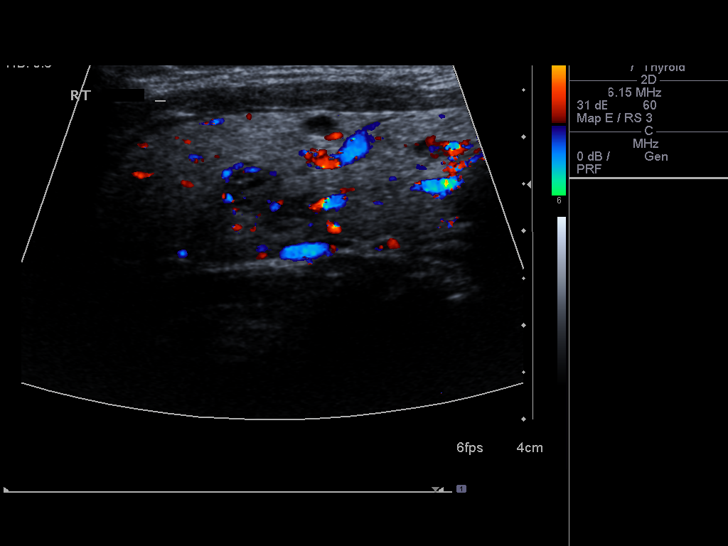
[im 79/87]
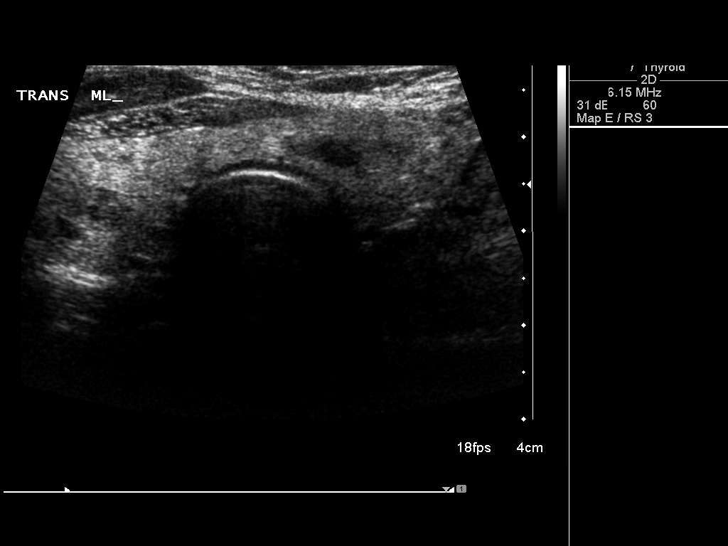
[im 87/87]
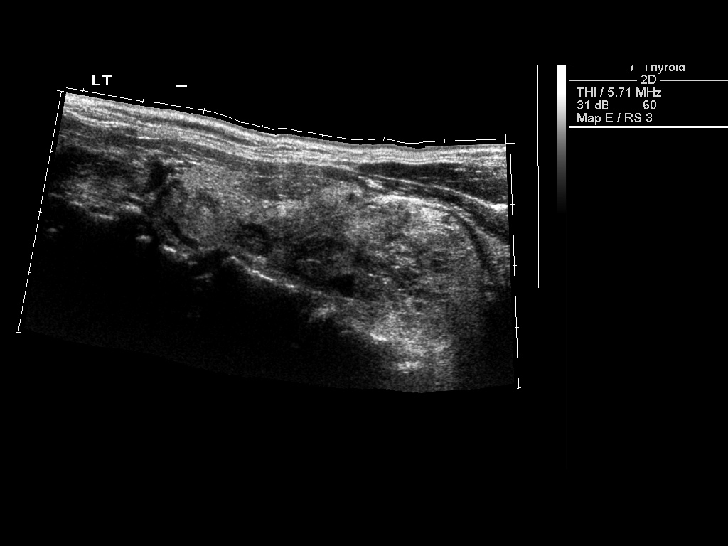

[14 of 25 positions shown; findings below may reference images not displayed]

FINDINGS: Right thyroid lobe

Measurements: 6.3 x 1.9 x 2.1 cm. Scattered hypoechoic foci in the
right thyroid lobe consistent with small nodules and heterogeneity.
There is a discrete nodule in the mid right thyroid lobe measuring
1.7 x 1.4 x 1.2 cm.

Left thyroid lobe

Measurements: 6.2 x 3.1 x 2.6 cm. Solid heterogeneous nodule along
the inferior left thyroid lobe. This nodule measures 3.3 x 2.3 x
cm. Additional small hypoechoic foci throughout the left thyroid
lobe.

Isthmus

Thickness: 0.7 cm. Small hypoechoic nodules in the isthmus. Largest
isthmus nodule measures up to 0.7 cm.

Lymphadenopathy

None visualized.
IMPRESSION: Multi nodular goiter. There is a dominant nodule on both sides of
the thyroid. Both of these dominant nodules meet criteria for
ultrasound-guided biopsy. This recommendation follows the consensus
statement: Management of Thyroid Nodules Detected at US: Society of
Radiologists in Ultrasound Consensus Conference Statement. Radiology

## 2017-04-18 ENCOUNTER — Ambulatory Visit
Admission: RE | Admit: 2017-04-18 | Discharge: 2017-04-18 | Disposition: A | Discharge status: Home / Self Care | Payer: Medicare Other | Source: Ambulatory Visit | Attending: Neurology | Admitting: Neurology

## 2017-04-18 DIAGNOSIS — R413 Other amnesia: Secondary | ICD-10-CM | POA: Diagnosis not present

## 2017-04-18 DIAGNOSIS — F331 Major depressive disorder, recurrent, moderate: Secondary | ICD-10-CM | POA: Diagnosis not present

## 2017-04-18 DIAGNOSIS — F4312 Post-traumatic stress disorder, chronic: Secondary | ICD-10-CM | POA: Diagnosis not present

## 2017-04-20 DIAGNOSIS — F331 Major depressive disorder, recurrent, moderate: Secondary | ICD-10-CM | POA: Diagnosis not present

## 2017-04-20 DIAGNOSIS — F4312 Post-traumatic stress disorder, chronic: Secondary | ICD-10-CM | POA: Diagnosis not present

## 2017-04-23 ENCOUNTER — Telehealth: Payer: Self-pay | Admitting: *Deleted

## 2017-04-23 NOTE — Telephone Encounter (Addendum)
Spoke with patient. Discussed that her MRI of the brain is unremarkable and that she does have some white matter changes. However that can be seen with normal aging but also diabetes, cholesterol, HTN, and smoking. RN encouraged pt to maintain healthy lifestyle. Pt verbalized understanding and appreciation.   ----- Message from Melvenia Beam, MD sent at 04/23/2017  8:39 AM EDT ----- MRi of the brain unremarkable. She has white matter changes that can be seen with normal aging but also diabetes, cholesterol, HTN, smoking.

## 2017-04-25 DIAGNOSIS — F331 Major depressive disorder, recurrent, moderate: Secondary | ICD-10-CM | POA: Diagnosis not present

## 2017-04-25 DIAGNOSIS — F4312 Post-traumatic stress disorder, chronic: Secondary | ICD-10-CM | POA: Diagnosis not present

## 2017-04-27 DIAGNOSIS — F331 Major depressive disorder, recurrent, moderate: Secondary | ICD-10-CM | POA: Diagnosis not present

## 2017-04-27 DIAGNOSIS — F4312 Post-traumatic stress disorder, chronic: Secondary | ICD-10-CM | POA: Diagnosis not present

## 2017-05-02 DIAGNOSIS — F331 Major depressive disorder, recurrent, moderate: Secondary | ICD-10-CM | POA: Diagnosis not present

## 2017-05-02 DIAGNOSIS — F4312 Post-traumatic stress disorder, chronic: Secondary | ICD-10-CM | POA: Diagnosis not present

## 2017-05-04 DIAGNOSIS — F331 Major depressive disorder, recurrent, moderate: Secondary | ICD-10-CM | POA: Diagnosis not present

## 2017-05-04 DIAGNOSIS — F4312 Post-traumatic stress disorder, chronic: Secondary | ICD-10-CM | POA: Diagnosis not present

## 2017-05-09 DIAGNOSIS — F4312 Post-traumatic stress disorder, chronic: Secondary | ICD-10-CM | POA: Diagnosis not present

## 2017-05-09 DIAGNOSIS — F331 Major depressive disorder, recurrent, moderate: Secondary | ICD-10-CM | POA: Diagnosis not present

## 2017-05-11 DIAGNOSIS — F4312 Post-traumatic stress disorder, chronic: Secondary | ICD-10-CM | POA: Diagnosis not present

## 2017-05-11 DIAGNOSIS — F331 Major depressive disorder, recurrent, moderate: Secondary | ICD-10-CM | POA: Diagnosis not present

## 2017-05-16 DIAGNOSIS — F4312 Post-traumatic stress disorder, chronic: Secondary | ICD-10-CM | POA: Diagnosis not present

## 2017-05-16 DIAGNOSIS — F331 Major depressive disorder, recurrent, moderate: Secondary | ICD-10-CM | POA: Diagnosis not present

## 2017-05-18 DIAGNOSIS — F331 Major depressive disorder, recurrent, moderate: Secondary | ICD-10-CM | POA: Diagnosis not present

## 2017-05-18 DIAGNOSIS — F4312 Post-traumatic stress disorder, chronic: Secondary | ICD-10-CM | POA: Diagnosis not present

## 2017-05-23 DIAGNOSIS — F331 Major depressive disorder, recurrent, moderate: Secondary | ICD-10-CM | POA: Diagnosis not present

## 2017-05-23 DIAGNOSIS — F4312 Post-traumatic stress disorder, chronic: Secondary | ICD-10-CM | POA: Diagnosis not present

## 2017-05-30 DIAGNOSIS — F331 Major depressive disorder, recurrent, moderate: Secondary | ICD-10-CM | POA: Diagnosis not present

## 2017-05-30 DIAGNOSIS — F4312 Post-traumatic stress disorder, chronic: Secondary | ICD-10-CM | POA: Diagnosis not present

## 2017-06-01 DIAGNOSIS — F4312 Post-traumatic stress disorder, chronic: Secondary | ICD-10-CM | POA: Diagnosis not present

## 2017-06-01 DIAGNOSIS — F331 Major depressive disorder, recurrent, moderate: Secondary | ICD-10-CM | POA: Diagnosis not present

## 2017-06-06 DIAGNOSIS — F331 Major depressive disorder, recurrent, moderate: Secondary | ICD-10-CM | POA: Diagnosis not present

## 2017-06-06 DIAGNOSIS — F4312 Post-traumatic stress disorder, chronic: Secondary | ICD-10-CM | POA: Diagnosis not present

## 2017-06-08 DIAGNOSIS — F331 Major depressive disorder, recurrent, moderate: Secondary | ICD-10-CM | POA: Diagnosis not present

## 2017-06-08 DIAGNOSIS — F4312 Post-traumatic stress disorder, chronic: Secondary | ICD-10-CM | POA: Diagnosis not present

## 2017-06-15 DIAGNOSIS — F4312 Post-traumatic stress disorder, chronic: Secondary | ICD-10-CM | POA: Diagnosis not present

## 2017-06-15 DIAGNOSIS — F331 Major depressive disorder, recurrent, moderate: Secondary | ICD-10-CM | POA: Diagnosis not present

## 2017-06-20 DIAGNOSIS — F4312 Post-traumatic stress disorder, chronic: Secondary | ICD-10-CM | POA: Diagnosis not present

## 2017-06-20 DIAGNOSIS — F331 Major depressive disorder, recurrent, moderate: Secondary | ICD-10-CM | POA: Diagnosis not present

## 2017-06-22 DIAGNOSIS — F4312 Post-traumatic stress disorder, chronic: Secondary | ICD-10-CM | POA: Diagnosis not present

## 2017-06-22 DIAGNOSIS — F331 Major depressive disorder, recurrent, moderate: Secondary | ICD-10-CM | POA: Diagnosis not present

## 2017-06-29 DIAGNOSIS — F331 Major depressive disorder, recurrent, moderate: Secondary | ICD-10-CM | POA: Diagnosis not present

## 2017-06-29 DIAGNOSIS — F4312 Post-traumatic stress disorder, chronic: Secondary | ICD-10-CM | POA: Diagnosis not present

## 2017-07-04 DIAGNOSIS — F4312 Post-traumatic stress disorder, chronic: Secondary | ICD-10-CM | POA: Diagnosis not present

## 2017-07-04 DIAGNOSIS — F331 Major depressive disorder, recurrent, moderate: Secondary | ICD-10-CM | POA: Diagnosis not present

## 2017-07-06 DIAGNOSIS — F4312 Post-traumatic stress disorder, chronic: Secondary | ICD-10-CM | POA: Diagnosis not present

## 2017-07-06 DIAGNOSIS — F331 Major depressive disorder, recurrent, moderate: Secondary | ICD-10-CM | POA: Diagnosis not present

## 2017-07-20 DIAGNOSIS — F332 Major depressive disorder, recurrent severe without psychotic features: Secondary | ICD-10-CM | POA: Diagnosis not present

## 2017-07-25 DIAGNOSIS — F332 Major depressive disorder, recurrent severe without psychotic features: Secondary | ICD-10-CM | POA: Diagnosis not present

## 2017-08-01 DIAGNOSIS — F332 Major depressive disorder, recurrent severe without psychotic features: Secondary | ICD-10-CM | POA: Diagnosis not present

## 2017-08-03 DIAGNOSIS — F332 Major depressive disorder, recurrent severe without psychotic features: Secondary | ICD-10-CM | POA: Diagnosis not present

## 2017-08-15 DIAGNOSIS — F332 Major depressive disorder, recurrent severe without psychotic features: Secondary | ICD-10-CM | POA: Diagnosis not present

## 2017-08-17 DIAGNOSIS — F332 Major depressive disorder, recurrent severe without psychotic features: Secondary | ICD-10-CM | POA: Diagnosis not present

## 2017-08-22 DIAGNOSIS — F332 Major depressive disorder, recurrent severe without psychotic features: Secondary | ICD-10-CM | POA: Diagnosis not present

## 2017-08-24 DIAGNOSIS — F332 Major depressive disorder, recurrent severe without psychotic features: Secondary | ICD-10-CM | POA: Diagnosis not present

## 2017-08-29 DIAGNOSIS — R159 Full incontinence of feces: Secondary | ICD-10-CM | POA: Diagnosis not present

## 2017-08-29 DIAGNOSIS — J449 Chronic obstructive pulmonary disease, unspecified: Secondary | ICD-10-CM | POA: Diagnosis not present

## 2017-08-29 DIAGNOSIS — F332 Major depressive disorder, recurrent severe without psychotic features: Secondary | ICD-10-CM | POA: Diagnosis not present

## 2017-08-29 DIAGNOSIS — E042 Nontoxic multinodular goiter: Secondary | ICD-10-CM | POA: Diagnosis not present

## 2017-08-29 DIAGNOSIS — E7800 Pure hypercholesterolemia, unspecified: Secondary | ICD-10-CM | POA: Diagnosis not present

## 2017-08-29 DIAGNOSIS — F329 Major depressive disorder, single episode, unspecified: Secondary | ICD-10-CM | POA: Diagnosis not present

## 2017-08-29 DIAGNOSIS — Z0000 Encounter for general adult medical examination without abnormal findings: Secondary | ICD-10-CM | POA: Diagnosis not present

## 2017-08-29 DIAGNOSIS — J309 Allergic rhinitis, unspecified: Secondary | ICD-10-CM | POA: Diagnosis not present

## 2017-08-31 DIAGNOSIS — F332 Major depressive disorder, recurrent severe without psychotic features: Secondary | ICD-10-CM | POA: Diagnosis not present

## 2017-09-05 DIAGNOSIS — F332 Major depressive disorder, recurrent severe without psychotic features: Secondary | ICD-10-CM | POA: Diagnosis not present

## 2017-09-07 DIAGNOSIS — F332 Major depressive disorder, recurrent severe without psychotic features: Secondary | ICD-10-CM | POA: Diagnosis not present

## 2017-09-21 DIAGNOSIS — F332 Major depressive disorder, recurrent severe without psychotic features: Secondary | ICD-10-CM | POA: Diagnosis not present

## 2017-09-26 DIAGNOSIS — K5790 Diverticulosis of intestine, part unspecified, without perforation or abscess without bleeding: Secondary | ICD-10-CM | POA: Diagnosis not present

## 2017-09-26 DIAGNOSIS — F332 Major depressive disorder, recurrent severe without psychotic features: Secondary | ICD-10-CM | POA: Diagnosis not present

## 2017-09-26 DIAGNOSIS — R195 Other fecal abnormalities: Secondary | ICD-10-CM | POA: Diagnosis not present

## 2017-09-28 DIAGNOSIS — F332 Major depressive disorder, recurrent severe without psychotic features: Secondary | ICD-10-CM | POA: Diagnosis not present

## 2017-10-03 DIAGNOSIS — F332 Major depressive disorder, recurrent severe without psychotic features: Secondary | ICD-10-CM | POA: Diagnosis not present

## 2017-10-09 DIAGNOSIS — F329 Major depressive disorder, single episode, unspecified: Secondary | ICD-10-CM | POA: Diagnosis not present

## 2017-10-09 DIAGNOSIS — Z23 Encounter for immunization: Secondary | ICD-10-CM | POA: Diagnosis not present

## 2017-10-10 DIAGNOSIS — F332 Major depressive disorder, recurrent severe without psychotic features: Secondary | ICD-10-CM | POA: Diagnosis not present

## 2017-10-11 DIAGNOSIS — Z1231 Encounter for screening mammogram for malignant neoplasm of breast: Secondary | ICD-10-CM | POA: Diagnosis not present

## 2017-10-12 DIAGNOSIS — F332 Major depressive disorder, recurrent severe without psychotic features: Secondary | ICD-10-CM | POA: Diagnosis not present

## 2017-10-15 ENCOUNTER — Ambulatory Visit (INDEPENDENT_AMBULATORY_CARE_PROVIDER_SITE_OTHER): Payer: Medicare Other | Admitting: Adult Health

## 2017-10-15 ENCOUNTER — Encounter: Payer: Self-pay | Admitting: Adult Health

## 2017-10-15 ENCOUNTER — Ambulatory Visit: Payer: Medicare Other | Admitting: Adult Health

## 2017-10-15 ENCOUNTER — Telehealth: Payer: Self-pay | Admitting: Adult Health

## 2017-10-15 VITALS — BP 118/79 | HR 71 | Ht 66.75 in | Wt 171.0 lb

## 2017-10-15 DIAGNOSIS — R413 Other amnesia: Secondary | ICD-10-CM | POA: Diagnosis not present

## 2017-10-15 NOTE — Progress Notes (Signed)
Made any corrections needed, and agree with history, physical, neuro exam,assessment and plan as stated above.     Quanta Robertshaw, MD [facility] 

## 2017-10-15 NOTE — Progress Notes (Signed)
PATIENT: Pamela Roth DOB: Nov 24, 1950  REASON FOR VISIT: follow up HISTORY FROM: patient  HISTORY OF PRESENT ILLNESS: Today 10/15/17:  Pamela Roth is a 67 year old female with a history of memory disturbance.  She returns today for follow-up.  She continues to live at home alone.  She is able to complete all ADLs independently.  She denies any trouble with her finances.  Denies any trouble preparing meals.  No change in mood or behavior.  He states that she does not sleep well.  She is currently on Zoloft but her PCP is weaning her off of this.  She states that overall she feels that things have remained stable.  She states that she has had 2 incidents where she got out of her car and left it running while she went into a store but did not realize that she did this.  She denies any significant amount of stress or anxiety.  She returns today for evaluation.  HISTORY Pamela Roth is a 68 y.o. female here as a referral from Dr. Radene Ou for memory loss.  Past medical history depression, anxiety, high cholesterol, COPD, hyperlipidemia, multinodular goiter. She feels she has normal forgetfulness, walks into room and forgets what she need to do. 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 Difficult job. She wasn't able to grasp it quickly and was let go after 6 weeks. She didn't have good training at her job. She lives alone, she is widowed. She doesn't have family in the area, she has a church family. No significant other. She feels bored in her home, she has some depression and is seeing a therapist started a month ago just to talk out some of her issues. Friends and family haven't noticed anything. She pays her bills on time never miss, takes medication on time no loss of medication, once 9-10 months ago she left a pot of boiling water on the stove but she remembered and ran back home and turned it off in 40  minutes no damage, no falls. She can cook, no los of cooking skills, driving is fine no accidents, she got her appointment mixed up last week thought is was friday but it was Thursday this is the first time it happened. She graduated HS and has some college. Her mom has paranoid schizophrenia and dementia at 41, she didn't know her biological father. No other FHx known of dementia.  She doesn't snore, no significant fatigue during the day. She doesn't sleep well. No other focal neurologic deficits, associated symptoms, inciting events or modifiable factors.   REVIEW OF SYSTEMS: Out of a complete 14 system review of symptoms, the patient complains only of the following symptoms, and all other reviewed systems are negative.  Insomnia  ALLERGIES: No Known Allergies  HOME MEDICATIONS: Outpatient Medications Prior to Visit  Medication Sig Dispense Refill  . sertraline (ZOLOFT) 50 MG tablet Take 50 mg by mouth daily. 10/15/17 Weaning off, taking 1/2 tablet  12  . simvastatin (ZOCOR) 20 MG tablet Take 20 mg by mouth daily at 6 PM.   5  . SYMBICORT 80-4.5 MCG/ACT inhaler Inhale 2 puffs into the lungs as needed.   12   No facility-administered medications prior to visit.     PAST MEDICAL HISTORY: Past Medical History:  Diagnosis Date  . Anxiety   . COPD (chronic obstructive pulmonary disease) ([place])   . Depression   .  Hyperlipidemia   . Multinodular goiter     PAST SURGICAL HISTORY: Past Surgical History:  Procedure Laterality Date  . PARTIAL HYSTERECTOMY  1980s    FAMILY HISTORY: Family History  Problem Relation Age of Onset  . Schizophrenia Mother   . Dementia Mother     SOCIAL HISTORY: Social History   Socioeconomic History  . Marital status: Widowed    Spouse name: Not on file  . Number of children: 1  . Years of education: some college  . Highest education level: Some college, no degree  Occupational History  . Not on file  Social Needs  . Financial resource strain:  Not on file  . Food insecurity:    Worry: Not on file    Inability: Not on file  . Transportation needs:    Medical: Not on file    Non-medical: Not on file  Tobacco Use  . Smoking status: Former Smoker    Last attempt to quit: 01/09/2008    Years since quitting: 9.7  . Smokeless tobacco: Never Used  Substance and Sexual Activity  . Alcohol use: No    Alcohol/week: 0.0 standard drinks  . Drug use: No  . Sexual activity: Not on file  Lifestyle  . Physical activity:    Days per week: Not on file    Minutes per session: Not on file  . Stress: Not on file  Relationships  . Social connections:    Talks on phone: Not on file    Gets together: Not on file    Attends religious service: Not on file    Active member of club or organization: Not on file    Attends meetings of clubs or organizations: Not on file    Relationship status: Not on file  . Intimate partner violence:    Fear of current or ex partner: Not on file    Emotionally abused: Not on file    Physically abused: Not on file    Forced sexual activity: Not on file  Other Topics Concern  . Not on file  Social History Narrative   Lives at home alone   Right handed   Caffeine: drinks 3 cups daily      PHYSICAL EXAM  Vitals:   10/15/17 0711  BP: 118/79  Pulse: 71  Weight: 171 lb (77.6 kg)  Height: 5' 6.75" (1.695 m)   Body mass index is 26.98 kg/m.   MMSE - Mini Mental State Exam 10/15/2017 04/10/2017  Orientation to time 5 5  Orientation to Place 5 5  Registration 3 3  Attention/ Calculation 3 5  Recall 3 2  Language- name 2 objects 2 2  Language- repeat 1 1  Language- follow 3 step command 3 3  Language- read & follow direction 1 1  Write a sentence 1 1  Copy design 1 0  Total score 28 28     Generalized: Well developed, in no acute distress   Neurological examination  Mentation: Alert oriented to time, place, history taking. Follows all commands speech and language fluent Cranial nerve II-XII:  Pupils were equal round reactive to light. Extraocular movements were full, visual field were full on confrontational test. Facial sensation and strength were normal. Uvula tongue midline. Head turning and shoulder shrug  were normal and symmetric. Motor: The motor testing reveals 5 over 5 strength of all 4 extremities. Good symmetric motor tone is noted throughout.  Sensory: Sensory testing is intact to soft touch on all 4 extremities. No  evidence of extinction is noted.  Coordination: Cerebellar testing reveals good finger-nose-finger and heel-to-shin bilaterally.  Gait and station: Gait is normal.  Reflexes: Deep tendon reflexes are symmetric and normal bilaterally.   DIAGNOSTIC DATA (LABS, IMAGING, TESTING) - I reviewed patient records, labs, notes, testing and imaging myself where available.  Lab Results  Component Value Date   WBC 11.4 (H) 10/31/2006   HGB 11.9 (L) 10/31/2006   HCT 35.1 (L) 10/31/2006   MCV 93.0 10/31/2006   PLT 214 10/31/2006      Component Value Date/Time   NA 139 10/29/2006 0422   K 4.4 DELTA CHECK NOTED 10/29/2006 0422   CL 108 10/29/2006 0422   CO2 28 10/29/2006 0422   GLUCOSE 111 (H) 10/29/2006 0422   BUN 18 10/29/2006 0422   CREATININE 0.88 10/29/2006 0422   CALCIUM 9.2 10/29/2006 0422   GFRNONAA >60 10/29/2006 0422   GFRAA  10/29/2006 0422    >60        The eGFR has been calculated using the MDRD equation. This calculation has not been validated in all clinical   Lab Results  Component Value Date   CHOL  10/28/2006    167        ATP III CLASSIFICATION:  <200     mg/dL   Desirable  200-239  mg/dL   Borderline High  >=240    mg/dL   High   HDL 57 10/28/2006   LDLCALC (H) 10/28/2006    105        Total Cholesterol/HDL:CHD Risk Coronary Heart Disease Risk Table                     Men   Women  1/2 Average Risk   3.4   3.3   TRIG 25 10/28/2006   CHOLHDL 2.9 10/28/2006       ASSESSMENT AND PLAN 67 y.o. year old female  has a past  medical history of Anxiety, COPD (chronic obstructive pulmonary disease) ([place]), Depression, Hyperlipidemia, and Multinodular goiter. here with :  1.  Memory disturbance  The patient's memory score has remained stable.  We discussed doing neuropsychological testing and the patient is amenable to this plan.  I will put a referral in.  We will continue to monitor the memory.  If her symptoms worsen or she develops new symptoms she should let us know.  She will follow-up in 6 months or sooner as needed.   I spent 15 minutes with the patient. 50% of this time was spent discussing her memory score   Ward Givens, MSN, NP-C 10/15/2017, 7:41 AM [facility] [address], [place], [place] [address] [phone]

## 2017-10-15 NOTE — Patient Instructions (Signed)
Your Plan:  Continue to monitor memory Memory score is stable Referral for neuropsychological testing for memory If your symptoms worsen or you develop new symptoms please let us know.    Thank you for coming to see [place] at [facility]. I hope we have been able to provide you high quality care today.  You may receive a patient satisfaction survey over the next few weeks. We would appreciate your feedback and comments so that we may continue to improve ourselves and the health of our patients.

## 2017-10-15 NOTE — Telephone Encounter (Signed)
Called patient and left her a message stating that [facility] will be calling her to schedule an apt . Relayed her apt most likely  will be booking at lease 5 months out to not be alarmed buy that . Please call me with any questions. Thanks Hinton Dyer. Neuropsychology

## 2017-10-17 ENCOUNTER — Encounter: Payer: Self-pay | Admitting: Psychology

## 2017-10-17 DIAGNOSIS — F332 Major depressive disorder, recurrent severe without psychotic features: Secondary | ICD-10-CM | POA: Diagnosis not present

## 2017-10-19 DIAGNOSIS — F332 Major depressive disorder, recurrent severe without psychotic features: Secondary | ICD-10-CM | POA: Diagnosis not present

## 2017-10-26 DIAGNOSIS — F332 Major depressive disorder, recurrent severe without psychotic features: Secondary | ICD-10-CM | POA: Diagnosis not present

## 2017-11-08 DIAGNOSIS — F332 Major depressive disorder, recurrent severe without psychotic features: Secondary | ICD-10-CM | POA: Diagnosis not present

## 2017-11-16 DIAGNOSIS — F332 Major depressive disorder, recurrent severe without psychotic features: Secondary | ICD-10-CM | POA: Diagnosis not present

## 2017-11-23 DIAGNOSIS — F332 Major depressive disorder, recurrent severe without psychotic features: Secondary | ICD-10-CM | POA: Diagnosis not present

## 2017-12-04 DIAGNOSIS — F332 Major depressive disorder, recurrent severe without psychotic features: Secondary | ICD-10-CM | POA: Diagnosis not present

## 2017-12-12 DIAGNOSIS — F332 Major depressive disorder, recurrent severe without psychotic features: Secondary | ICD-10-CM | POA: Diagnosis not present

## 2017-12-19 DIAGNOSIS — F332 Major depressive disorder, recurrent severe without psychotic features: Secondary | ICD-10-CM | POA: Diagnosis not present

## 2017-12-26 DIAGNOSIS — R1310 Dysphagia, unspecified: Secondary | ICD-10-CM | POA: Diagnosis not present

## 2017-12-28 DIAGNOSIS — F332 Major depressive disorder, recurrent severe without psychotic features: Secondary | ICD-10-CM | POA: Diagnosis not present

## 2018-01-04 DIAGNOSIS — F332 Major depressive disorder, recurrent severe without psychotic features: Secondary | ICD-10-CM | POA: Diagnosis not present

## 2018-01-07 DIAGNOSIS — I10 Essential (primary) hypertension: Secondary | ICD-10-CM | POA: Diagnosis not present

## 2018-01-15 DIAGNOSIS — F331 Major depressive disorder, recurrent, moderate: Secondary | ICD-10-CM | POA: Diagnosis not present

## 2018-01-28 DIAGNOSIS — K5790 Diverticulosis of intestine, part unspecified, without perforation or abscess without bleeding: Secondary | ICD-10-CM | POA: Diagnosis not present

## 2018-01-29 DIAGNOSIS — F331 Major depressive disorder, recurrent, moderate: Secondary | ICD-10-CM | POA: Diagnosis not present

## 2018-02-05 DIAGNOSIS — I10 Essential (primary) hypertension: Secondary | ICD-10-CM | POA: Diagnosis not present

## 2018-02-06 DIAGNOSIS — F331 Major depressive disorder, recurrent, moderate: Secondary | ICD-10-CM | POA: Diagnosis not present

## 2018-02-11 DIAGNOSIS — F331 Major depressive disorder, recurrent, moderate: Secondary | ICD-10-CM | POA: Diagnosis not present

## 2018-02-19 DIAGNOSIS — F331 Major depressive disorder, recurrent, moderate: Secondary | ICD-10-CM | POA: Diagnosis not present

## 2018-02-26 DIAGNOSIS — F331 Major depressive disorder, recurrent, moderate: Secondary | ICD-10-CM | POA: Diagnosis not present

## 2018-03-19 DIAGNOSIS — F331 Major depressive disorder, recurrent, moderate: Secondary | ICD-10-CM | POA: Diagnosis not present

## 2018-04-16 ENCOUNTER — Ambulatory Visit: Payer: Medicare Other | Admitting: Neurology

## 2018-04-16 DIAGNOSIS — F331 Major depressive disorder, recurrent, moderate: Secondary | ICD-10-CM | POA: Diagnosis not present

## 2018-04-21 ENCOUNTER — Encounter: Payer: Medicare Other | Admitting: Psychology

## 2018-04-29 ENCOUNTER — Telehealth: Payer: Self-pay | Admitting: Neurology

## 2018-04-29 NOTE — Telephone Encounter (Signed)
I called the patient several times and left VM's asking for a return call. Today I called for the 4th time and left a VM (ok per DPR) notifying her that we were cancelling her apt and asking her to call back if she'd like to rs. DW

## 2018-04-29 NOTE — Telephone Encounter (Signed)
Noted thank you

## 2018-04-30 ENCOUNTER — Ambulatory Visit: Payer: Medicare Other | Admitting: Neurology

## 2018-04-30 DIAGNOSIS — F331 Major depressive disorder, recurrent, moderate: Secondary | ICD-10-CM | POA: Diagnosis not present

## 2018-05-29 DIAGNOSIS — F331 Major depressive disorder, recurrent, moderate: Secondary | ICD-10-CM | POA: Diagnosis not present

## 2018-06-12 DIAGNOSIS — F331 Major depressive disorder, recurrent, moderate: Secondary | ICD-10-CM | POA: Diagnosis not present

## 2018-07-09 DIAGNOSIS — F331 Major depressive disorder, recurrent, moderate: Secondary | ICD-10-CM | POA: Diagnosis not present

## 2018-07-24 DIAGNOSIS — F331 Major depressive disorder, recurrent, moderate: Secondary | ICD-10-CM | POA: Diagnosis not present

## 2018-07-25 DIAGNOSIS — J449 Chronic obstructive pulmonary disease, unspecified: Secondary | ICD-10-CM | POA: Diagnosis not present

## 2018-07-25 DIAGNOSIS — M25551 Pain in right hip: Secondary | ICD-10-CM | POA: Diagnosis not present

## 2018-08-05 DIAGNOSIS — M1611 Unilateral primary osteoarthritis, right hip: Secondary | ICD-10-CM | POA: Diagnosis not present

## 2018-08-18 DIAGNOSIS — M1611 Unilateral primary osteoarthritis, right hip: Secondary | ICD-10-CM | POA: Diagnosis not present

## 2018-08-21 DIAGNOSIS — F331 Major depressive disorder, recurrent, moderate: Secondary | ICD-10-CM | POA: Diagnosis not present

## 2018-09-04 DIAGNOSIS — M1611 Unilateral primary osteoarthritis, right hip: Secondary | ICD-10-CM | POA: Diagnosis not present

## 2018-09-04 DIAGNOSIS — F331 Major depressive disorder, recurrent, moderate: Secondary | ICD-10-CM | POA: Diagnosis not present

## 2018-09-18 DIAGNOSIS — F331 Major depressive disorder, recurrent, moderate: Secondary | ICD-10-CM | POA: Diagnosis not present

## 2018-09-30 DIAGNOSIS — I10 Essential (primary) hypertension: Secondary | ICD-10-CM | POA: Diagnosis not present

## 2018-09-30 DIAGNOSIS — Z23 Encounter for immunization: Secondary | ICD-10-CM | POA: Diagnosis not present

## 2018-09-30 DIAGNOSIS — J449 Chronic obstructive pulmonary disease, unspecified: Secondary | ICD-10-CM | POA: Diagnosis not present

## 2018-09-30 DIAGNOSIS — J309 Allergic rhinitis, unspecified: Secondary | ICD-10-CM | POA: Diagnosis not present

## 2018-09-30 DIAGNOSIS — E042 Nontoxic multinodular goiter: Secondary | ICD-10-CM | POA: Diagnosis not present

## 2018-09-30 DIAGNOSIS — Z1389 Encounter for screening for other disorder: Secondary | ICD-10-CM | POA: Diagnosis not present

## 2018-09-30 DIAGNOSIS — F329 Major depressive disorder, single episode, unspecified: Secondary | ICD-10-CM | POA: Diagnosis not present

## 2018-09-30 DIAGNOSIS — E7800 Pure hypercholesterolemia, unspecified: Secondary | ICD-10-CM | POA: Diagnosis not present

## 2018-09-30 DIAGNOSIS — R413 Other amnesia: Secondary | ICD-10-CM | POA: Diagnosis not present

## 2018-09-30 DIAGNOSIS — Z0000 Encounter for general adult medical examination without abnormal findings: Secondary | ICD-10-CM | POA: Diagnosis not present

## 2018-10-02 DIAGNOSIS — F331 Major depressive disorder, recurrent, moderate: Secondary | ICD-10-CM | POA: Diagnosis not present

## 2018-10-14 DIAGNOSIS — M1611 Unilateral primary osteoarthritis, right hip: Secondary | ICD-10-CM | POA: Diagnosis not present

## 2018-10-17 DIAGNOSIS — F331 Major depressive disorder, recurrent, moderate: Secondary | ICD-10-CM | POA: Diagnosis not present

## 2018-10-24 DIAGNOSIS — Z1231 Encounter for screening mammogram for malignant neoplasm of breast: Secondary | ICD-10-CM | POA: Diagnosis not present

## 2018-10-28 DIAGNOSIS — M1611 Unilateral primary osteoarthritis, right hip: Secondary | ICD-10-CM | POA: Diagnosis not present

## 2018-10-29 DIAGNOSIS — F329 Major depressive disorder, single episode, unspecified: Secondary | ICD-10-CM | POA: Diagnosis not present

## 2018-11-13 DIAGNOSIS — F331 Major depressive disorder, recurrent, moderate: Secondary | ICD-10-CM | POA: Diagnosis not present

## 2018-12-11 DIAGNOSIS — F331 Major depressive disorder, recurrent, moderate: Secondary | ICD-10-CM | POA: Diagnosis not present

## 2018-12-26 ENCOUNTER — Emergency Department (HOSPITAL_COMMUNITY)
Admission: EM | Admit: 2018-12-26 | Discharge: 2018-12-26 | Disposition: A | Discharge status: Home / Self Care | Payer: Medicare Other | Attending: Emergency Medicine | Admitting: Emergency Medicine

## 2018-12-26 ENCOUNTER — Emergency Department (HOSPITAL_COMMUNITY): Payer: Medicare Other

## 2018-12-26 ENCOUNTER — Encounter (HOSPITAL_COMMUNITY): Payer: Self-pay | Admitting: Emergency Medicine

## 2018-12-26 DIAGNOSIS — Z79899 Other long term (current) drug therapy: Secondary | ICD-10-CM | POA: Insufficient documentation

## 2018-12-26 DIAGNOSIS — Y999 Unspecified external cause status: Secondary | ICD-10-CM | POA: Diagnosis not present

## 2018-12-26 DIAGNOSIS — Y92410 Unspecified street and highway as the place of occurrence of the external cause: Secondary | ICD-10-CM | POA: Diagnosis not present

## 2018-12-26 DIAGNOSIS — Z87891 Personal history of nicotine dependence: Secondary | ICD-10-CM | POA: Diagnosis not present

## 2018-12-26 DIAGNOSIS — M542 Cervicalgia: Secondary | ICD-10-CM | POA: Diagnosis not present

## 2018-12-26 DIAGNOSIS — M25531 Pain in right wrist: Secondary | ICD-10-CM | POA: Insufficient documentation

## 2018-12-26 DIAGNOSIS — R0789 Other chest pain: Secondary | ICD-10-CM | POA: Insufficient documentation

## 2018-12-26 DIAGNOSIS — J449 Chronic obstructive pulmonary disease, unspecified: Secondary | ICD-10-CM | POA: Insufficient documentation

## 2018-12-26 DIAGNOSIS — Y93I9 Activity, other involving external motion: Secondary | ICD-10-CM | POA: Diagnosis not present

## 2018-12-26 MED ORDER — METHOCARBAMOL 500 MG PO TABS: 500.0000 mg | ORAL_TABLET | Freq: Two times a day (BID) | ORAL | 0 refills | Status: AC

## 2018-12-26 NOTE — ED Notes (Signed)
Patient transported to CT 

## 2018-12-26 NOTE — Discharge Instructions (Addendum)
I have prescribed muscle relaxers for your pain, please do not drink or drive while taking this medications as they can make you drowsy.  Please follow-up with PCP in 1 week for reevaluation of your symptoms.  You experience any bowel or bladder incontinence, fever, worsening in your symptoms please return to the ED. ° °
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
## 2018-12-26 NOTE — ED Notes (Signed)
Patient refused d/c vital signs.
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
## 2019-02-05 DIAGNOSIS — F331 Major depressive disorder, recurrent, moderate: Secondary | ICD-10-CM | POA: Diagnosis not present

## 2019-02-09 DIAGNOSIS — M545 Low back pain: Secondary | ICD-10-CM | POA: Diagnosis not present

## 2019-04-02 ENCOUNTER — Other Ambulatory Visit: Payer: Self-pay | Admitting: Orthopedic Surgery

## 2019-04-02 DIAGNOSIS — M545 Low back pain: Secondary | ICD-10-CM | POA: Diagnosis not present

## 2019-04-02 DIAGNOSIS — M1611 Unilateral primary osteoarthritis, right hip: Secondary | ICD-10-CM | POA: Diagnosis not present

## 2019-04-03 DIAGNOSIS — E042 Nontoxic multinodular goiter: Secondary | ICD-10-CM | POA: Diagnosis not present

## 2019-04-03 DIAGNOSIS — J449 Chronic obstructive pulmonary disease, unspecified: Secondary | ICD-10-CM | POA: Diagnosis not present

## 2019-04-03 DIAGNOSIS — I10 Essential (primary) hypertension: Secondary | ICD-10-CM | POA: Diagnosis not present

## 2019-04-03 DIAGNOSIS — E7800 Pure hypercholesterolemia, unspecified: Secondary | ICD-10-CM | POA: Diagnosis not present

## 2019-04-03 DIAGNOSIS — Z7189 Other specified counseling: Secondary | ICD-10-CM | POA: Diagnosis not present

## 2019-04-03 DIAGNOSIS — M1990 Unspecified osteoarthritis, unspecified site: Secondary | ICD-10-CM | POA: Diagnosis not present

## 2019-04-13 DIAGNOSIS — S335XXD Sprain of ligaments of lumbar spine, subsequent encounter: Secondary | ICD-10-CM | POA: Diagnosis not present

## 2019-04-15 DIAGNOSIS — S335XXD Sprain of ligaments of lumbar spine, subsequent encounter: Secondary | ICD-10-CM | POA: Diagnosis not present

## 2019-04-20 DIAGNOSIS — S335XXD Sprain of ligaments of lumbar spine, subsequent encounter: Secondary | ICD-10-CM | POA: Diagnosis not present

## 2019-04-22 DIAGNOSIS — S335XXD Sprain of ligaments of lumbar spine, subsequent encounter: Secondary | ICD-10-CM | POA: Diagnosis not present

## 2019-04-24 NOTE — Patient Instructions (Addendum)
DUE TO COVID-19 ONLY ONE VISITOR IS ALLOWED TO COME WITH YOU AND STAY IN THE WAITING ROOM ONLY DURING PRE OP AND PROCEDURE DAY OF SURGERY. THE 1 VISITOR MAY VISIT WITH YOU AFTER SURGERY IN YOUR PRIVATE ROOM DURING VISITING HOURS ONLY!  YOU NEED TO HAVE A COVID 19 TEST ON: 04/30/19 @ 8:30 am, THIS TEST MUST BE DONE BEFORE SURGERY, COME  [address], [address] [place] , [id number].  ([place]) ONCE YOUR COVID TEST IS COMPLETED, PLEASE BEGIN THE QUARANTINE INSTRUCTIONS AS OUTLINED IN YOUR HANDOUT.                Pamela Roth     Your procedure is scheduled on: 05/04/19   Report to [facility] Main  Entrance   Report to admitting at: 7:30 AM     Call this number if you have problems the morning of surgery [phone]    Remember:    [place], NO [place].     Take these medicines the morning of surgery with A SIP OF WATER: N/A.    Use Inhalers and eye drops as usual.                                 You may not have any metal on your body including hair pins and              piercings  Do not wear jewelry, make-up, lotions, powders or perfumes, deodorant             Do not wear nail polish on your fingernails.  Do not shave  48 hours prior to surgery.                Do not bring valuables to the hospital. [place].  Contacts, dentures or bridgework may not be worn into surgery.  Leave suitcase in the car. After surgery it may be brought to your room.     Patients discharged the day of surgery will not be allowed to drive home. IF YOU ARE HAVING SURGERY AND GOING HOME THE SAME DAY, YOU MUST HAVE AN ADULT TO DRIVE YOU HOME AND BE WITH YOU FOR 24 HOURS. YOU MAY GO HOME BY TAXI OR UBER OR ORTHERWISE, BUT AN ADULT MUST ACCOMPANY YOU HOME AND STAY WITH YOU FOR 24 HOURS.  Name and phone number of your driver:  Special Instructions: N/A            Please read over the following fact sheets you were given: _____________________________________________________________________             NO SOLID FOOD AFTER MIDNIGHT THE NIGHT PRIOR TO SURGERY. NOTHING BY MOUTH EXCEPT CLEAR LIQUIDS UNTIL: 7:00 am . PLEASE FINISH ENSURE DRINK PER SURGEON ORDER  WHICH NEEDS TO BE COMPLETED AT: 7:00 am .   CLEAR LIQUID DIET   Foods Allowed  Foods Excluded  Coffee and tea, regular and decaf                             liquids that you cannot  Plain Jell-O any favor except red or purple                                           see through such as: Fruit ices (not with fruit pulp)                                     milk, soups, orange juice  Iced Popsicles                                    All solid food Carbonated beverages, regular and diet                                    Cranberry, grape and apple juices Sports drinks like Gatorade Lightly seasoned clear broth or consume(fat free) Sugar, honey syrup  Sample Menu Breakfast                                Lunch                                     Supper Cranberry juice                    Beef broth                            Chicken broth Jell-O                                     Grape juice                           Apple juice Coffee or tea                        Jell-O                                      Popsicle                                                Coffee or tea                        Coffee or tea  _____________________________________________________________________  [facility] - Preparing for Surgery Before surgery, you can play an important role.  Because skin is not sterile, your skin  needs to be as free of germs as possible.  You can reduce the number of germs on your skin by washing with CHG (chlorahexidine gluconate) soap before surgery.  CHG is an antiseptic cleaner which kills germs and bonds  with the skin to continue killing germs even after washing. Please DO NOT use if you have an allergy to CHG or antibacterial soaps.  If your skin becomes reddened/irritated stop using the CHG and inform your nurse when you arrive at Short Stay. Do not shave (including legs and underarms) for at least 48 hours prior to the first CHG shower.  You may shave your face/neck. Please follow these instructions carefully:  1.  Shower with CHG Soap the night before surgery and the  morning of Surgery.  2.  If you choose to wash your hair, wash your hair first as usual with your  normal  shampoo.  3.  After you shampoo, rinse your hair and body thoroughly to remove the  shampoo.                           4.  Use CHG as you would any other liquid soap.  You can apply chg directly  to the skin and wash                       Gently with a scrungie or clean washcloth.  5.  Apply the CHG Soap to your body ONLY FROM THE NECK DOWN.   Do not use on face/ open                           Wound or open sores. Avoid contact with eyes, ears mouth and genitals (private parts).                       Wash face,  Genitals (private parts) with your normal soap.             6.  Wash thoroughly, paying special attention to the area where your surgery  will be performed.  7.  Thoroughly rinse your body with warm water from the neck down.  8.  DO NOT shower/wash with your normal soap after using and rinsing off  the CHG Soap.                9.  Pat yourself dry with a clean towel.            10.  Wear clean pajamas.            11.  Place clean sheets on your bed the night of your first shower and do not  sleep with pets. Day of Surgery : Do not apply any lotions/deodorants the morning of surgery.  Please wear clean clothes to the hospital/surgery center.  FAILURE TO FOLLOW THESE INSTRUCTIONS MAY RESULT IN THE CANCELLATION OF YOUR SURGERY PATIENT SIGNATURE_________________________________  NURSE  SIGNATURE__________________________________  ________________________________________________________________________   Pamela Roth  An incentive spirometer is a tool that can help keep your lungs clear and active. This tool measures how well you are filling your lungs with each breath. Taking long deep breaths may help reverse or decrease the chance of developing breathing (pulmonary) problems (especially infection) following:  A long period of time when you are unable to move or be active. BEFORE THE PROCEDURE   If the spirometer includes an indicator to  show your best effort, your nurse or respiratory therapist will set it to a desired goal.  If possible, sit up straight or lean slightly forward. Try not to slouch.  Hold the incentive spirometer in an upright position. INSTRUCTIONS FOR USE  1. Sit on the edge of your bed if possible, or sit up as far as you can in bed or on a chair. 2. Hold the incentive spirometer in an upright position. 3. Breathe out normally. 4. Place the mouthpiece in your mouth and seal your lips tightly around it. 5. Breathe in slowly and as deeply as possible, raising the piston or the ball toward the top of the column. 6. Hold your breath for 3-5 seconds or for as long as possible. Allow the piston or ball to fall to the bottom of the column. 7. Remove the mouthpiece from your mouth and breathe out normally. 8. Rest for a few seconds and repeat Steps 1 through 7 at least 10 times every 1-2 hours when you are awake. Take your time and take a few normal breaths between deep breaths. 9. The spirometer may include an indicator to show your best effort. Use the indicator as a goal to work toward during each repetition. 10. After each set of 10 deep breaths, practice coughing to be sure your lungs are clear. If you have an incision (the cut made at the time of surgery), support your incision when coughing by placing a pillow or rolled up towels firmly  against it. Once you are able to get out of bed, walk around indoors and cough well. You may stop using the incentive spirometer when instructed by your caregiver.  RISKS AND COMPLICATIONS  Take your time so you do not get dizzy or light-headed.  If you are in pain, you may need to take or ask for pain medication before doing incentive spirometry. It is harder to take a deep breath if you are having pain. AFTER USE  Rest and breathe slowly and easily.  It can be helpful to keep track of a log of your progress. Your caregiver can provide you with a simple table to help with this. If you are using the spirometer at home, follow these instructions: [place] IF:   You are having difficultly using the spirometer.  You have trouble using the spirometer as often as instructed.  Your pain medication is not giving enough relief while using the spirometer.  You develop fever of 100.5 F (38.1 C) or higher. SEEK IMMEDIATE MEDICAL CARE IF:   You cough up bloody sputum that had not been present before.  You develop fever of 102 F (38.9 C) or greater.  You develop worsening pain at or near the incision site. MAKE SURE YOU:   Understand these instructions.  Will watch your condition.  Will get help right away if you are not doing well or get worse. Document Released: 05/07/2006 Document Revised: 03/19/2011 Document Reviewed: 07/08/2006 [facility] Patient Information 2014 [place], [place].   ________________________________________________________________________

## 2019-04-27 ENCOUNTER — Encounter (HOSPITAL_COMMUNITY): Payer: Self-pay

## 2019-04-27 ENCOUNTER — Ambulatory Visit (HOSPITAL_COMMUNITY)
Admission: RE | Admit: 2019-04-27 | Discharge: 2019-04-27 | Disposition: A | Discharge status: Home / Self Care | Payer: Medicare Other | Source: Ambulatory Visit | Attending: Orthopedic Surgery | Admitting: Orthopedic Surgery

## 2019-04-27 ENCOUNTER — Other Ambulatory Visit: Payer: Self-pay

## 2019-04-27 ENCOUNTER — Encounter (HOSPITAL_COMMUNITY)
Admission: RE | Admit: 2019-04-27 | Discharge: 2019-04-27 | Disposition: A | Discharge status: Home / Self Care | Payer: Medicare Other | Source: Ambulatory Visit | Attending: Orthopedic Surgery | Admitting: Orthopedic Surgery

## 2019-04-27 DIAGNOSIS — S335XXD Sprain of ligaments of lumbar spine, subsequent encounter: Secondary | ICD-10-CM | POA: Diagnosis not present

## 2019-04-27 DIAGNOSIS — Z01818 Encounter for other preprocedural examination: Secondary | ICD-10-CM | POA: Insufficient documentation

## 2019-04-27 DIAGNOSIS — I10 Essential (primary) hypertension: Secondary | ICD-10-CM | POA: Diagnosis not present

## 2019-04-27 HISTORY — DX: Essential (primary) hypertension: I10

## 2019-04-27 LAB — CBC WITH DIFFERENTIAL/PLATELET
Abs Immature Granulocytes: 0.01 10*3/uL (ref 0.00–0.07)
Basophils Absolute: 0 10*3/uL (ref 0.0–0.1)
Basophils Relative: 1 %
Eosinophils Absolute: 0.3 10*3/uL (ref 0.0–0.5)
Eosinophils Relative: 6 %
HCT: 43.2 % (ref 36.0–46.0)
Hemoglobin: 14 g/dL (ref 12.0–15.0)
Immature Granulocytes: 0 %
Lymphocytes Relative: 45 %
Lymphs Abs: 2.2 10*3/uL (ref 0.7–4.0)
MCH: 30 pg (ref 26.0–34.0)
MCHC: 32.4 g/dL (ref 30.0–36.0)
MCV: 92.7 fL (ref 80.0–100.0)
Monocytes Absolute: 0.3 10*3/uL (ref 0.1–1.0)
Monocytes Relative: 6 %
Neutro Abs: 2 10*3/uL (ref 1.7–7.7)
Neutrophils Relative %: 42 %
Platelets: 214 10*3/uL (ref 150–400)
RBC: 4.66 MIL/uL (ref 3.87–5.11)
RDW: 14.9 % (ref 11.5–15.5)
WBC: 4.9 10*3/uL (ref 4.0–10.5)
nRBC: 0 % (ref 0.0–0.2)

## 2019-04-27 LAB — URINALYSIS, ROUTINE W REFLEX MICROSCOPIC
Bacteria, UA: NONE SEEN
Bilirubin Urine: NEGATIVE
Glucose, UA: NEGATIVE mg/dL
Hgb urine dipstick: NEGATIVE
Ketones, ur: NEGATIVE mg/dL
Nitrite: NEGATIVE
Protein, ur: NEGATIVE mg/dL
Specific Gravity, Urine: 1.02 (ref 1.005–1.030)
pH: 5 (ref 5.0–8.0)

## 2019-04-27 LAB — APTT: aPTT: 33 seconds (ref 24–36)

## 2019-04-27 LAB — PROTIME-INR
INR: 1.1 (ref 0.8–1.2)
Prothrombin Time: 13.8 seconds (ref 11.4–15.2)

## 2019-04-27 LAB — BASIC METABOLIC PANEL
Anion gap: 8 (ref 5–15)
BUN: 16 mg/dL (ref 8–23)
CO2: 27 mmol/L (ref 22–32)
Calcium: 9.4 mg/dL (ref 8.9–10.3)
Chloride: 104 mmol/L (ref 98–111)
Creatinine, Ser: 1.04 mg/dL — ABNORMAL HIGH (ref 0.44–1.00)
GFR calc Af Amer: >60 mL/min (ref >60)
GFR calc non Af Amer: 54 mL/min — ABNORMAL LOW (ref >60)
Glucose, Bld: 101 mg/dL — ABNORMAL HIGH (ref 70–99)
Potassium: 4.3 mmol/L (ref 3.5–5.1)
Sodium: 139 mmol/L (ref 135–145)

## 2019-04-27 LAB — ABO/RH: ABO/RH(D): O POS

## 2019-04-27 LAB — SURGICAL PCR SCREEN
MRSA, PCR: NEGATIVE
Staphylococcus aureus: NEGATIVE

## 2019-04-27 NOTE — Care Plan (Signed)
Ortho Bundle Case Management Note  Patient Details  Name: Pamela Roth MRN: [id number] Date of Birth: 06/03/1950    Spoke with patient prior to surgery. She lives alone and plans to discharge to home with a friend. [place], [place], [address] [address]. Patient can be reached on her cell phone. She has equipment. HHPT referral to [facility]. OPPT set up at [facility].  Patient and MD in agreement with plan. Choice offered.                  DME Arranged:    DME Agency:     HH Arranged:  PT HH Agency:  [facility] ([facility])  Additional Comments: Please contact me with any questions of if this plan should need to change.  Ladell Heads,  [place] [facility]ist  [phone] 04/27/2019, 5:06 PM

## 2019-04-27 NOTE — Progress Notes (Signed)
PCP - Dr. Lady Deutscher. LOV: 02/09/19 Cardiologist -   Chest x-ray -  EKG -  Stress Test -  ECHO -  Cardiac Cath -   Sleep Study -  CPAP -   Fasting Blood Sugar -  Checks Blood Sugar _____ times a day  Blood Thinner Instructions: Aspirin Instructions: Last Dose:  Anesthesia review:   Patient denies shortness of breath, fever, cough and chest pain at PAT appointment   Patient verbalized understanding of instructions that were given to them at the PAT appointment. Patient was also instructed that they will need to review over the PAT instructions again at home before surgery.

## 2019-04-28 DIAGNOSIS — S335XXD Sprain of ligaments of lumbar spine, subsequent encounter: Secondary | ICD-10-CM | POA: Diagnosis not present

## 2019-04-29 DIAGNOSIS — S335XXD Sprain of ligaments of lumbar spine, subsequent encounter: Secondary | ICD-10-CM | POA: Diagnosis not present

## 2019-04-30 ENCOUNTER — Inpatient Hospital Stay (HOSPITAL_COMMUNITY): Admission: RE | Admit: 2019-04-30 | Payer: PRIVATE HEALTH INSURANCE | Source: Ambulatory Visit

## 2019-05-01 ENCOUNTER — Other Ambulatory Visit (HOSPITAL_COMMUNITY)
Admission: RE | Admit: 2019-05-01 | Discharge: 2019-05-01 | Disposition: A | Discharge status: Home / Self Care | Payer: Medicare Other | Source: Ambulatory Visit | Attending: Orthopedic Surgery | Admitting: Orthopedic Surgery

## 2019-05-01 DIAGNOSIS — Z20822 Contact with and (suspected) exposure to covid-19: Secondary | ICD-10-CM | POA: Insufficient documentation

## 2019-05-01 DIAGNOSIS — M1611 Unilateral primary osteoarthritis, right hip: Secondary | ICD-10-CM | POA: Diagnosis present

## 2019-05-01 DIAGNOSIS — Z01812 Encounter for preprocedural laboratory examination: Secondary | ICD-10-CM | POA: Insufficient documentation

## 2019-05-01 LAB — SARS CORONAVIRUS 2 (TAT 6-24 HRS): SARS Coronavirus 2: NEGATIVE

## 2019-05-01 NOTE — H&P (Signed)
TOTAL HIP ADMISSION H&P  Patient is admitted for right total hip arthroplasty.  Subjective:  Chief Complaint: right hip pain  HPI: Pamela Roth, 69 y.o. female, has a history of pain and functional disability in the right hip(s) due to arthritis and patient has failed non-surgical conservative treatments for greater than 12 weeks to include NSAID's and/or analgesics, corticosteriod injections and activity modification.  Onset of symptoms was gradual starting 2 years ago with gradually worsening course since that time.The patient noted no past surgery on the right hip(s).  Patient currently rates pain in the right hip at 10 out of 10 with activity. Patient has night pain, worsening of pain with activity and weight bearing, trendelenberg gait, pain that interfers with activities of daily living and pain with passive range of motion. Patient has evidence of joint space narrowing by imaging studies. This condition presents safety issues increasing the risk of falls.  There is no current active infection.  Patient Active Problem List   Diagnosis Date Noted  . CHRONIC OBSTRUCTIVE PULMONARY DISEASE, ACUTE EXACERBATION 03/06/2007  . BRONCHITIS 11/08/2006  . REACTIVE AIRWAY DISEASE 11/08/2006  . DYSLIPIDEMIA 11/07/2006  . PERIODONTITIS, ACUTE 11/07/2006  . POSITIVE PPD 11/07/2006   Past Medical History:  Diagnosis Date  . Anxiety   . COPD (chronic obstructive pulmonary disease) ([place])   . Depression   . Hyperlipidemia   . Hypertension   . Multinodular goiter     Past Surgical History:  Procedure Laterality Date  . PARTIAL HYSTERECTOMY  1980s    No current facility-administered medications for this encounter.   Current Outpatient Medications  Medication Sig Dispense Refill Last Dose  . Camphor-Menthol-Methyl Sal (SALONPAS EX) Place 1 patch onto the skin daily as needed (hip pain.).     Marland Kitchen gabapentin (NEURONTIN) 100 MG capsule Take 100 mg by mouth at bedtime.     Marland Kitchen losartan (COZAAR)  100 MG tablet Take 100 mg by mouth at bedtime.     . meloxicam (MOBIC) 15 MG tablet Take 15 mg by mouth daily.     . naphazoline-glycerin (CLEAR EYES REDNESS) 0.012-0.2 % SOLN Place 1 drop into both eyes 4 (four) times daily as needed for eye irritation.     . simvastatin (ZOCOR) 20 MG tablet Take 20 mg by mouth at bedtime.   5   . SYMBICORT 80-4.5 MCG/ACT inhaler Inhale 2 puffs into the lungs in the morning and at bedtime.   12   . VENTOLIN HFA 108 (90 Base) MCG/ACT inhaler Inhale 1 puff into the lungs every 4 (four) hours as needed for wheezing or shortness of breath.      No Known Allergies  Social History   Tobacco Use  . Smoking status: Former Smoker    Quit date: 01/09/2008    Years since quitting: 11.3  . Smokeless tobacco: Never Used  Substance Use Topics  . Alcohol use: No    Alcohol/week: 0.0 standard drinks    Family History  Problem Relation Age of Onset  . Schizophrenia Mother   . Dementia Mother      Review of Systems  Constitutional: Negative.   HENT: Negative.   Eyes: Negative.   Respiratory: Positive for shortness of breath.   Cardiovascular:       HTN  Gastrointestinal: Negative.   Endocrine: Negative.   Genitourinary: Positive for frequency.  Musculoskeletal: Positive for arthralgias.  Skin: Negative.   Allergic/Immunologic: Negative.   Neurological: Negative.   Hematological: Negative.   Psychiatric/Behavioral: Negative.  Objective:  Physical Exam  Constitutional: She is oriented to person, place, and time. She appears well-developed and well-nourished.  HENT:  Head: Normocephalic and atraumatic.  Eyes: Pupils are equal, round, and reactive to light.  Cardiovascular: Intact distal pulses.  Respiratory: Effort normal.  Musculoskeletal:        General: Tenderness present.     Cervical back: Normal range of motion and neck supple.     Comments: Patient is right hip is irritable to internal rotation at 5 external rotation at 30 foot tap is  negative she does walk with a moderate limp.  Skin is intact neurovascular intact.    Neurological: She is alert and oriented to person, place, and time.  Skin: Skin is warm and dry.  Psychiatric: She has a normal mood and affect. Her behavior is normal. Judgment and thought content normal.    Vital signs in last 24 hours:    Labs:   Estimated body mass index is 27.14 kg/m as calculated from the following:   Height as of 04/27/19: 5\' 6"  (1.676 m).   Weight as of 04/27/19: 76.3 kg.   Imaging Review Plain radiographs demonstrate near bone-on-bone arthritic changes from loss of cartilage in the right hip.       Assessment/Plan:  End stage arthritis, right hip(s)  The patient history, physical examination, clinical judgement of the provider and imaging studies are consistent with end stage degenerative joint disease of the right hip(s) and total hip arthroplasty is deemed medically necessary. The treatment options including medical management, injection therapy, arthroscopy and arthroplasty were discussed at length. The risks and benefits of total hip arthroplasty were presented and reviewed. The risks due to aseptic loosening, infection, stiffness, dislocation/subluxation,  thromboembolic complications and other imponderables were discussed.  The patient acknowledged the explanation, agreed to proceed with the plan and consent was signed. Patient is being admitted for inpatient treatment for surgery, pain control, PT, OT, prophylactic antibiotics, VTE prophylaxis, progressive ambulation and ADL's and discharge planning.The patient is planning to be discharged home with home health services    Patient's anticipated LOS is less than 2 midnights, meeting these requirements: - Younger than 28 - Lives within 1 hour of care - Has a competent adult at home to recover with post-op recover - NO history of  - Chronic pain requiring opiods  - Diabetes  - Coronary Artery Disease  - Heart  failure  - Heart attack  - Stroke  - DVT/VTE  - Cardiac arrhythmia  - Respiratory Failure/COPD  - Renal failure  - Anemia  - Advanced Liver disease

## 2019-05-03 MED ORDER — BUPIVACAINE LIPOSOME 1.3 % IJ SUSP
10.0000 mL | Freq: Once | INTRAMUSCULAR | Status: DC
  Filled 2019-05-03: qty 10

## 2019-05-03 MED ORDER — TRANEXAMIC ACID 1000 MG/10ML IV SOLN
2000.0000 mg | INTRAVENOUS | Status: DC
  Filled 2019-05-03: qty 20

## 2019-05-04 ENCOUNTER — Ambulatory Visit (HOSPITAL_COMMUNITY): Payer: Medicare Other | Admitting: Certified Registered Nurse Anesthetist

## 2019-05-04 ENCOUNTER — Inpatient Hospital Stay (HOSPITAL_COMMUNITY)
Admission: RE | Admit: 2019-05-04 | Discharge: 2019-05-06 | DRG: 470 | Disposition: A | Discharge status: Home Health | Payer: Medicare Other | Attending: Orthopedic Surgery | Admitting: Orthopedic Surgery

## 2019-05-04 ENCOUNTER — Encounter (HOSPITAL_COMMUNITY): Payer: Self-pay | Admitting: Orthopedic Surgery

## 2019-05-04 ENCOUNTER — Ambulatory Visit (HOSPITAL_COMMUNITY): Payer: Medicare Other

## 2019-05-04 ENCOUNTER — Encounter (HOSPITAL_COMMUNITY)
Admission: RE | Disposition: A | Discharge status: Home Health | Payer: Self-pay | Source: Home / Self Care | Attending: Orthopedic Surgery

## 2019-05-04 DIAGNOSIS — Z791 Long term (current) use of non-steroidal anti-inflammatories (NSAID): Secondary | ICD-10-CM | POA: Diagnosis not present

## 2019-05-04 DIAGNOSIS — I10 Essential (primary) hypertension: Secondary | ICD-10-CM | POA: Diagnosis present

## 2019-05-04 DIAGNOSIS — Z96641 Presence of right artificial hip joint: Secondary | ICD-10-CM

## 2019-05-04 DIAGNOSIS — R7611 Nonspecific reaction to tuberculin skin test without active tuberculosis: Secondary | ICD-10-CM | POA: Diagnosis not present

## 2019-05-04 DIAGNOSIS — J449 Chronic obstructive pulmonary disease, unspecified: Secondary | ICD-10-CM | POA: Diagnosis present

## 2019-05-04 DIAGNOSIS — M1611 Unilateral primary osteoarthritis, right hip: Secondary | ICD-10-CM | POA: Diagnosis not present

## 2019-05-04 DIAGNOSIS — Z87891 Personal history of nicotine dependence: Secondary | ICD-10-CM

## 2019-05-04 DIAGNOSIS — F418 Other specified anxiety disorders: Secondary | ICD-10-CM | POA: Diagnosis not present

## 2019-05-04 DIAGNOSIS — D62 Acute posthemorrhagic anemia: Secondary | ICD-10-CM | POA: Diagnosis not present

## 2019-05-04 DIAGNOSIS — Z419 Encounter for procedure for purposes other than remedying health state, unspecified: Secondary | ICD-10-CM

## 2019-05-04 DIAGNOSIS — Z79899 Other long term (current) drug therapy: Secondary | ICD-10-CM | POA: Diagnosis not present

## 2019-05-04 DIAGNOSIS — Z9181 History of falling: Secondary | ICD-10-CM | POA: Diagnosis not present

## 2019-05-04 DIAGNOSIS — Z7951 Long term (current) use of inhaled steroids: Secondary | ICD-10-CM

## 2019-05-04 DIAGNOSIS — Z471 Aftercare following joint replacement surgery: Secondary | ICD-10-CM | POA: Diagnosis not present

## 2019-05-04 DIAGNOSIS — Z972 Presence of dental prosthetic device (complete) (partial): Secondary | ICD-10-CM | POA: Diagnosis not present

## 2019-05-04 HISTORY — PX: TOTAL HIP ARTHROPLASTY: SHX124

## 2019-05-04 LAB — TYPE AND SCREEN
ABO/RH(D): O POS
Antibody Screen: NEGATIVE

## 2019-05-04 SURGERY — ARTHROPLASTY, HIP, TOTAL, ANTERIOR APPROACH
Anesthesia: Spinal | Site: Hip | Laterality: Right

## 2019-05-04 MED ORDER — TRANEXAMIC ACID 1000 MG/10ML IV SOLN
INTRAVENOUS | Status: DC | PRN
  Administered 2019-05-04: 2000 mg via TOPICAL

## 2019-05-04 MED ORDER — PANTOPRAZOLE SODIUM 40 MG PO TBEC
40.0000 mg | DELAYED_RELEASE_TABLET | Freq: Every day | ORAL | Status: DC
  Administered 2019-05-04 – 2019-05-06 (×3): 40 mg via ORAL
  Filled 2019-05-04 (×2): qty 1

## 2019-05-04 MED ORDER — TRANEXAMIC ACID-NACL 1000-0.7 MG/100ML-% IV SOLN
1000.0000 mg | Freq: Once | INTRAVENOUS | Status: AC
  Administered 2019-05-04: 1000 mg via INTRAVENOUS
  Filled 2019-05-04: qty 100

## 2019-05-04 MED ORDER — PROPOFOL 500 MG/50ML IV EMUL
INTRAVENOUS | Status: AC
  Filled 2019-05-04: qty 50

## 2019-05-04 MED ORDER — FLEET ENEMA 7-19 GM/118ML RE ENEM: 1.0000 | ENEMA | Freq: Once | RECTAL | Status: DC | PRN

## 2019-05-04 MED ORDER — TRANEXAMIC ACID-NACL 1000-0.7 MG/100ML-% IV SOLN
1000.0000 mg | INTRAVENOUS | Status: AC
  Administered 2019-05-04: 1000 mg via INTRAVENOUS
  Filled 2019-05-04: qty 100

## 2019-05-04 MED ORDER — BUPIVACAINE IN DEXTROSE 0.75-8.25 % IT SOLN
INTRATHECAL | Status: DC | PRN
  Administered 2019-05-04: 1.9 mL via INTRATHECAL

## 2019-05-04 MED ORDER — OXYCODONE HCL 5 MG PO TABS
5.0000 mg | ORAL_TABLET | ORAL | Status: DC | PRN
  Administered 2019-05-04 – 2019-05-05 (×4): 10 mg via ORAL
  Administered 2019-05-05 – 2019-05-06 (×2): 5 mg via ORAL
  Filled 2019-05-04: qty 2
  Filled 2019-05-04 (×2): qty 1
  Filled 2019-05-04 (×3): qty 2

## 2019-05-04 MED ORDER — CELECOXIB 200 MG PO CAPS
200.0000 mg | ORAL_CAPSULE | Freq: Two times a day (BID) | ORAL | Status: DC
  Administered 2019-05-04 – 2019-05-06 (×4): 200 mg via ORAL
  Filled 2019-05-04 (×4): qty 1

## 2019-05-04 MED ORDER — PHENOL 1.4 % MT LIQD: 1.0000 | OROMUCOSAL | Status: DC | PRN

## 2019-05-04 MED ORDER — GABAPENTIN 100 MG PO CAPS
100.0000 mg | ORAL_CAPSULE | Freq: Three times a day (TID) | ORAL | Status: DC
  Administered 2019-05-04 – 2019-05-06 (×5): 100 mg via ORAL
  Filled 2019-05-04 (×5): qty 1

## 2019-05-04 MED ORDER — OXYCODONE-ACETAMINOPHEN 5-325 MG PO TABS: 1.0000 | ORAL_TABLET | ORAL | 0 refills | Status: DC | PRN

## 2019-05-04 MED ORDER — DEXAMETHASONE SODIUM PHOSPHATE 4 MG/ML IJ SOLN
INTRAMUSCULAR | Status: DC | PRN
  Administered 2019-05-04: 4 mg via INTRAVENOUS

## 2019-05-04 MED ORDER — STERILE WATER FOR IRRIGATION IR SOLN
Status: DC | PRN
  Administered 2019-05-04: 2000 mL

## 2019-05-04 MED ORDER — ALBUTEROL SULFATE (2.5 MG/3ML) 0.083% IN NEBU: 3.0000 mL | INHALATION_SOLUTION | RESPIRATORY_TRACT | Status: DC | PRN

## 2019-05-04 MED ORDER — PROPOFOL 10 MG/ML IV BOLUS
INTRAVENOUS | Status: DC | PRN
Start: 2019-05-04 — End: 2019-05-04
  Administered 2019-05-04: 20 mg via INTRAVENOUS

## 2019-05-04 MED ORDER — SIMVASTATIN 20 MG PO TABS
20.0000 mg | ORAL_TABLET | Freq: Every day | ORAL | Status: DC
  Administered 2019-05-04 – 2019-05-05 (×2): 20 mg via ORAL
  Filled 2019-05-04 (×2): qty 1

## 2019-05-04 MED ORDER — PHENYLEPHRINE HCL (PRESSORS) 10 MG/ML IV SOLN
INTRAVENOUS | Status: DC | PRN
Start: 2019-05-04 — End: 2019-05-04
  Administered 2019-05-04: 40 ug via INTRAVENOUS
  Administered 2019-05-04: 100 ug via INTRAVENOUS
  Administered 2019-05-04: 120 ug via INTRAVENOUS
  Administered 2019-05-04: 80 ug via INTRAVENOUS

## 2019-05-04 MED ORDER — ONDANSETRON HCL 4 MG/2ML IJ SOLN
INTRAMUSCULAR | Status: DC | PRN
  Administered 2019-05-04: 4 mg via INTRAVENOUS

## 2019-05-04 MED ORDER — ONDANSETRON HCL 4 MG PO TABS: 4.0000 mg | ORAL_TABLET | Freq: Four times a day (QID) | ORAL | Status: DC | PRN

## 2019-05-04 MED ORDER — BISACODYL 5 MG PO TBEC: 5.0000 mg | DELAYED_RELEASE_TABLET | Freq: Every day | ORAL | Status: DC | PRN

## 2019-05-04 MED ORDER — MIDAZOLAM HCL 5 MG/5ML IJ SOLN
INTRAMUSCULAR | Status: DC | PRN
  Administered 2019-05-04 (×2): .5 mg via INTRAVENOUS
  Administered 2019-05-04: 1 mg via INTRAVENOUS

## 2019-05-04 MED ORDER — ALUM & MAG HYDROXIDE-SIMETH 200-200-20 MG/5ML PO SUSP: 30.0000 mL | ORAL | Status: DC | PRN

## 2019-05-04 MED ORDER — LOSARTAN POTASSIUM 50 MG PO TABS
100.0000 mg | ORAL_TABLET | Freq: Every day | ORAL | Status: DC
  Administered 2019-05-04 – 2019-05-05 (×2): 100 mg via ORAL
  Filled 2019-05-04 (×2): qty 2

## 2019-05-04 MED ORDER — FENTANYL CITRATE (PF) 100 MCG/2ML IJ SOLN
INTRAMUSCULAR | Status: AC
  Filled 2019-05-04: qty 2

## 2019-05-04 MED ORDER — TIZANIDINE HCL 2 MG PO TABS
2.0000 mg | ORAL_TABLET | Freq: Four times a day (QID) | ORAL | 0 refills | Status: DC | PRN
Start: 2019-05-04 — End: 2019-11-02

## 2019-05-04 MED ORDER — LIDOCAINE HCL (CARDIAC) PF 100 MG/5ML IV SOSY
PREFILLED_SYRINGE | INTRAVENOUS | Status: DC | PRN
  Administered 2019-05-04: 50 mg via INTRAVENOUS

## 2019-05-04 MED ORDER — CEFAZOLIN SODIUM-DEXTROSE 2-4 GM/100ML-% IV SOLN
2.0000 g | INTRAVENOUS | Status: AC
  Administered 2019-05-04: 2 g via INTRAVENOUS
  Filled 2019-05-04: qty 100

## 2019-05-04 MED ORDER — KCL IN DEXTROSE-NACL 20-5-0.45 MEQ/L-%-% IV SOLN
INTRAVENOUS | Status: DC
  Filled 2019-05-04 (×3): qty 1000

## 2019-05-04 MED ORDER — BUPIVACAINE LIPOSOME 1.3 % IJ SUSP
INTRAMUSCULAR | Status: DC | PRN
  Administered 2019-05-04: 10 mL

## 2019-05-04 MED ORDER — FENTANYL CITRATE (PF) 100 MCG/2ML IJ SOLN
25.0000 ug | INTRAMUSCULAR | Status: DC | PRN
  Administered 2019-05-04 (×2): 50 ug via INTRAVENOUS

## 2019-05-04 MED ORDER — METOCLOPRAMIDE HCL 5 MG PO TABS: 5.0000 mg | ORAL_TABLET | Freq: Three times a day (TID) | ORAL | Status: DC | PRN

## 2019-05-04 MED ORDER — METOCLOPRAMIDE HCL 5 MG/ML IJ SOLN: 5.0000 mg | Freq: Three times a day (TID) | INTRAMUSCULAR | Status: DC | PRN

## 2019-05-04 MED ORDER — ACETAMINOPHEN 325 MG PO TABS: 325.0000 mg | ORAL_TABLET | Freq: Four times a day (QID) | ORAL | Status: DC | PRN

## 2019-05-04 MED ORDER — BUPIVACAINE-EPINEPHRINE (PF) 0.5% -1:200000 IJ SOLN
INTRAMUSCULAR | Status: AC
  Filled 2019-05-04: qty 30

## 2019-05-04 MED ORDER — ACETAMINOPHEN 500 MG PO TABS
1000.0000 mg | ORAL_TABLET | Freq: Once | ORAL | Status: AC
  Administered 2019-05-04: 08:00:00 1000 mg via ORAL
  Filled 2019-05-04: qty 2

## 2019-05-04 MED ORDER — ONDANSETRON HCL 4 MG/2ML IJ SOLN
INTRAMUSCULAR | Status: AC
  Filled 2019-05-04: qty 2

## 2019-05-04 MED ORDER — NAPHAZOLINE-GLYCERIN 0.012-0.2 % OP SOLN
1.0000 [drp] | Freq: Four times a day (QID) | OPHTHALMIC | Status: DC | PRN
  Filled 2019-05-04: qty 15

## 2019-05-04 MED ORDER — POLYETHYLENE GLYCOL 3350 17 G PO PACK: 17.0000 g | PACK | Freq: Every day | ORAL | Status: DC | PRN

## 2019-05-04 MED ORDER — METHOCARBAMOL 500 MG IVPB - SIMPLE MED
500.0000 mg | Freq: Four times a day (QID) | INTRAVENOUS | Status: DC | PRN
  Administered 2019-05-04: 500 mg via INTRAVENOUS
  Filled 2019-05-04: qty 50
  Filled 2019-05-04: qty 500

## 2019-05-04 MED ORDER — ASPIRIN EC 81 MG PO TBEC
81.0000 mg | DELAYED_RELEASE_TABLET | Freq: Two times a day (BID) | ORAL | 0 refills | Status: DC
Start: 2019-05-04 — End: 2019-11-02

## 2019-05-04 MED ORDER — ASPIRIN 81 MG PO CHEW
81.0000 mg | CHEWABLE_TABLET | Freq: Two times a day (BID) | ORAL | Status: DC
  Administered 2019-05-04 – 2019-05-06 (×4): 81 mg via ORAL
  Filled 2019-05-04 (×4): qty 1

## 2019-05-04 MED ORDER — MIDAZOLAM HCL 2 MG/2ML IJ SOLN
INTRAMUSCULAR | Status: AC
  Filled 2019-05-04: qty 2

## 2019-05-04 MED ORDER — ACETAMINOPHEN 500 MG PO TABS
1000.0000 mg | ORAL_TABLET | Freq: Four times a day (QID) | ORAL | Status: AC
  Administered 2019-05-04 – 2019-05-05 (×4): 1000 mg via ORAL
  Filled 2019-05-04 (×4): qty 2

## 2019-05-04 MED ORDER — BUPIVACAINE-EPINEPHRINE 0.5% -1:200000 IJ SOLN
INTRAMUSCULAR | Status: DC | PRN
  Administered 2019-05-04: 30 mL

## 2019-05-04 MED ORDER — PHENYLEPHRINE 40 MCG/ML (10ML) SYRINGE FOR IV PUSH (FOR BLOOD PRESSURE SUPPORT)
PREFILLED_SYRINGE | INTRAVENOUS | Status: AC
  Filled 2019-05-04: qty 10

## 2019-05-04 MED ORDER — MOMETASONE FURO-FORMOTEROL FUM 100-5 MCG/ACT IN AERO
2.0000 | INHALATION_SPRAY | Freq: Two times a day (BID) | RESPIRATORY_TRACT | Status: DC
  Administered 2019-05-04 – 2019-05-06 (×4): 2 via RESPIRATORY_TRACT
  Filled 2019-05-04: qty 8.8

## 2019-05-04 MED ORDER — SODIUM CHLORIDE 0.9% FLUSH
INTRAVENOUS | Status: DC | PRN
  Administered 2019-05-04: 50 mL via INTRAVENOUS

## 2019-05-04 MED ORDER — SODIUM CHLORIDE (PF) 0.9 % IJ SOLN
INTRAMUSCULAR | Status: AC
  Filled 2019-05-04: qty 50

## 2019-05-04 MED ORDER — HYDROMORPHONE HCL 1 MG/ML IJ SOLN
0.5000 mg | INTRAMUSCULAR | Status: DC | PRN
  Administered 2019-05-04: 1 mg via INTRAVENOUS
  Filled 2019-05-04: qty 1

## 2019-05-04 MED ORDER — ONDANSETRON HCL 4 MG/2ML IJ SOLN
4.0000 mg | Freq: Four times a day (QID) | INTRAMUSCULAR | Status: DC | PRN
  Administered 2019-05-04 – 2019-05-05 (×4): 4 mg via INTRAVENOUS
  Filled 2019-05-04 (×4): qty 2

## 2019-05-04 MED ORDER — DEXAMETHASONE SODIUM PHOSPHATE 10 MG/ML IJ SOLN
INTRAMUSCULAR | Status: AC
  Filled 2019-05-04: qty 1

## 2019-05-04 MED ORDER — 0.9 % SODIUM CHLORIDE (POUR BTL) OPTIME
TOPICAL | Status: DC | PRN
  Administered 2019-05-04: 1000 mL

## 2019-05-04 MED ORDER — ONDANSETRON HCL 4 MG/2ML IJ SOLN: 4.0000 mg | Freq: Once | INTRAMUSCULAR | Status: DC | PRN

## 2019-05-04 MED ORDER — DIPHENHYDRAMINE HCL 12.5 MG/5ML PO ELIX
12.5000 mg | ORAL_SOLUTION | ORAL | Status: DC | PRN
  Administered 2019-05-04: 22:00:00 12.5 mg via ORAL
  Filled 2019-05-04: qty 5

## 2019-05-04 MED ORDER — LACTATED RINGERS IV SOLN: INTRAVENOUS | Status: DC

## 2019-05-04 MED ORDER — DEXAMETHASONE SODIUM PHOSPHATE 10 MG/ML IJ SOLN
10.0000 mg | Freq: Once | INTRAMUSCULAR | Status: AC
  Administered 2019-05-05: 10 mg via INTRAVENOUS
  Filled 2019-05-04: qty 1

## 2019-05-04 MED ORDER — FENTANYL CITRATE (PF) 100 MCG/2ML IJ SOLN
INTRAMUSCULAR | Status: DC | PRN
  Administered 2019-05-04: 25 ug via INTRAVENOUS
  Administered 2019-05-04: 15 ug via INTRAVENOUS
  Administered 2019-05-04 (×2): 25 ug via INTRAVENOUS

## 2019-05-04 MED ORDER — LIDOCAINE 2% (20 MG/ML) 5 ML SYRINGE
INTRAMUSCULAR | Status: AC
  Filled 2019-05-04: qty 5

## 2019-05-04 MED ORDER — POVIDONE-IODINE 10 % EX SWAB
2.0000 "application " | Freq: Once | CUTANEOUS | Status: AC
  Administered 2019-05-04: 2 via TOPICAL

## 2019-05-04 MED ORDER — MENTHOL 3 MG MT LOZG: 1.0000 | LOZENGE | OROMUCOSAL | Status: DC | PRN

## 2019-05-04 MED ORDER — DOCUSATE SODIUM 100 MG PO CAPS
100.0000 mg | ORAL_CAPSULE | Freq: Two times a day (BID) | ORAL | Status: DC
  Administered 2019-05-04 – 2019-05-06 (×4): 100 mg via ORAL
  Filled 2019-05-04 (×4): qty 1

## 2019-05-04 MED ORDER — PROPOFOL 500 MG/50ML IV EMUL
INTRAVENOUS | Status: DC | PRN
  Administered 2019-05-04: 25 ug/kg/min via INTRAVENOUS

## 2019-05-04 MED ORDER — METHOCARBAMOL 500 MG PO TABS
500.0000 mg | ORAL_TABLET | Freq: Four times a day (QID) | ORAL | Status: DC | PRN
  Administered 2019-05-05 (×2): 500 mg via ORAL
  Filled 2019-05-04 (×2): qty 1

## 2019-05-04 SURGICAL SUPPLY — 48 items
BAG DECANTER FOR FLEXI CONT (MISCELLANEOUS) ×2 IMPLANT
BLADE SAW SGTL 18X1.27X75 (BLADE) ×2 IMPLANT
BLADE SURG SZ10 CARB STEEL (BLADE) ×4 IMPLANT
CNTNR URN SCR LID CUP LEK RST (MISCELLANEOUS) ×1 IMPLANT
CONT SPEC 4OZ STRL OR WHT (MISCELLANEOUS) ×2
COVER PERINEAL POST (MISCELLANEOUS) ×2 IMPLANT
COVER SURGICAL LIGHT HANDLE (MISCELLANEOUS) ×2 IMPLANT
COVER WAND RF STERILE (DRAPES) ×2 IMPLANT
CUP ACETBLR 52 OD 100 SERIES (Hips) ×1 IMPLANT
DECANTER SPIKE VIAL GLASS SM (MISCELLANEOUS) ×4 IMPLANT
DRAPE STERI IOBAN 125X83 (DRAPES) ×2 IMPLANT
DRAPE U-SHAPE 47X51 STRL (DRAPES) ×4 IMPLANT
DRSG AQUACEL AG ADV 3.5X 6 (GAUZE/BANDAGES/DRESSINGS) ×1 IMPLANT
DRSG AQUACEL AG ADV 3.5X10 (GAUZE/BANDAGES/DRESSINGS) ×2 IMPLANT
DURAPREP 26ML APPLICATOR (WOUND CARE) ×2 IMPLANT
ELECT BLADE TIP CTD 4 INCH (ELECTRODE) ×2 IMPLANT
ELECT REM PT RETURN 15FT ADLT (MISCELLANEOUS) ×2 IMPLANT
ELIMINATOR HOLE APEX DEPUY (Hips) ×1 IMPLANT
GLOVE BIO SURGEON STRL SZ7.5 (GLOVE) ×2 IMPLANT
GLOVE BIO SURGEON STRL SZ8.5 (GLOVE) ×2 IMPLANT
GLOVE BIOGEL PI IND STRL 8 (GLOVE) ×1 IMPLANT
GLOVE BIOGEL PI IND STRL 9 (GLOVE) ×1 IMPLANT
GLOVE BIOGEL PI INDICATOR 8 (GLOVE) ×1
GLOVE BIOGEL PI INDICATOR 9 (GLOVE) ×1
GOWN STRL REUS W/TWL XL LVL3 (GOWN DISPOSABLE) ×4 IMPLANT
HEAD CERAMIC DELTA 36 PLUS 1.5 (Hips) ×1 IMPLANT
HOLDER FOLEY CATH W/STRAP (MISCELLANEOUS) ×2 IMPLANT
KIT TURNOVER KIT A (KITS) IMPLANT
LINER NEUTRAL 52X36MM PLUS 4 (Liner) ×1 IMPLANT
MANIFOLD NEPTUNE II (INSTRUMENTS) ×2 IMPLANT
NDL HYPO 21X1.5 SAFETY (NEEDLE) ×2 IMPLANT
NEEDLE HYPO 21X1.5 SAFETY (NEEDLE) ×4 IMPLANT
NS IRRIG 1000ML POUR BTL (IV SOLUTION) ×2 IMPLANT
PACK ANTERIOR HIP CUSTOM (KITS) ×2 IMPLANT
PENCIL SMOKE EVACUATOR (MISCELLANEOUS) IMPLANT
STEM FEMORAL SZ 5MM STD ACTIS (Stem) ×1 IMPLANT
SUT ETHIBOND NAB CT1 #1 30IN (SUTURE) ×2 IMPLANT
SUT VIC AB 0 CT1 27 (SUTURE) ×2
SUT VIC AB 0 CT1 27XBRD ANBCTR (SUTURE) ×1 IMPLANT
SUT VIC AB 1 CTX 36 (SUTURE) ×2
SUT VIC AB 1 CTX36XBRD ANBCTR (SUTURE) ×1 IMPLANT
SUT VIC AB 2-0 CT1 27 (SUTURE)
SUT VIC AB 2-0 CT1 TAPERPNT 27 (SUTURE) IMPLANT
SUT VIC AB 3-0 CT1 27 (SUTURE) ×2
SUT VIC AB 3-0 CT1 TAPERPNT 27 (SUTURE) ×1 IMPLANT
SYR CONTROL 10ML LL (SYRINGE) ×6 IMPLANT
TRAY FOLEY MTR SLVR 16FR STAT (SET/KITS/TRAYS/PACK) IMPLANT
YANKAUER SUCT BULB TIP 10FT TU (MISCELLANEOUS) ×2 IMPLANT

## 2019-05-04 NOTE — Anesthesia Postprocedure Evaluation (Signed)
Anesthesia Post Note  Patient: Pamela Roth  Procedure(s) Performed: RIGHT TOTAL HIP ARTHROPLASTY ANTERIOR APPROACH (Right Hip)     Patient location during evaluation: PACU Anesthesia Type: Spinal Level of consciousness: oriented and awake and alert Pain management: pain level controlled Vital Signs Assessment: post-procedure vital signs reviewed and stable Respiratory status: spontaneous breathing, respiratory function stable and patient connected to nasal cannula oxygen Cardiovascular status: blood pressure returned to baseline and stable Postop Assessment: no headache, no backache, no apparent nausea or vomiting and spinal receding Anesthetic complications: no    Last Vitals:  Vitals:   05/04/19 1330 05/04/19 1349  BP:  (!) 142/81  Pulse: (!) 55 (!) 56  Resp: 12   Temp: (!) 36.4 C (!) 36.4 C  SpO2: 100% 100%    Last Pain:  Vitals:   05/04/19 1349  TempSrc: Oral  PainSc:                  [name]

## 2019-05-04 NOTE — Anesthesia Procedure Notes (Signed)
Spinal  Patient location during procedure: OR Start time: 05/04/2019 9:40 AM End time: 05/04/2019 9:50 AM Staffing Performed: anesthesiologist  Anesthesiologist: Murvin Natal, MD Preanesthetic Checklist Completed: patient identified, IV checked, risks and benefits discussed, surgical consent, monitors and equipment checked, pre-op evaluation and timeout performed Spinal Block Patient position: sitting Prep: DuraPrep Patient monitoring: cardiac monitor, continuous pulse ox and blood pressure Approach: midline Location: L4-5 Injection technique: single-shot Needle Needle type: Pencan  Needle gauge: 24 G Needle length: 9 cm Assessment Sensory level: T10 Additional Notes Functioning IV was confirmed and monitors were applied. Sterile prep and drape, including hand hygiene and sterile gloves were used. The patient was positioned and the spine was prepped. The skin was anesthetized with lidocaine.  Free flow of clear CSF was obtained prior to injecting local anesthetic into the CSF.  The spinal needle aspirated freely following injection.  The needle was carefully withdrawn.  The patient tolerated the procedure well.

## 2019-05-04 NOTE — Interval H&P Note (Signed)
History and Physical Interval Note:  05/04/2019 8:40 AM  Pamela Roth  has presented today for surgery, with the diagnosis of RIGHT HIP [place].  The various methods of treatment have been discussed with the patient and family. After consideration of risks, benefits and other options for treatment, the patient has consented to  Procedure(s): RIGHT TOTAL HIP ARTHROPLASTY ANTERIOR APPROACH (Right) as a surgical intervention.  The patient's history has been reviewed, patient examined, no change in status, stable for surgery.  I have reviewed the patient's chart and labs.  Questions were answered to the patient's satisfaction.     Kerin Salen

## 2019-05-04 NOTE — Op Note (Signed)
PATIENT ID:      Pamela Roth  MRN:     [id number] DOB/AGE:    [birth year] / 69 y.o.  OPERATIVE REPORT   DATE OF PROCEDURE:  05/04/2019      PREOPERATIVE DIAGNOSIS:  RIGHT HIP DEGENERTIVE JOINT DISEASE                                                         POSTOPERATIVE DIAGNOSIS:  Same                                                         PROCEDURE: Anterior R total hip arthroplasty using a 52 mm DePuy Pinnacle  Cup, [employer], 0-degree polyethylene liner, a +1.5 mm x 7mm ceramic head, a 5std Depuy Actis stem  SURGEON: Kerin Salen  ASSISTANT:   Kerry Hough. [employer]  (present throughout entire procedure and necessary for timely completion of the procedure)   ANESTHESIA: Spinal, Exparel 133mg  injection BLOOD LOSS: 300 cc FLUID REPLACEMENT: 1500 cc crystalloid TRANEXAMIC ACID: 1gm IV, 2gm Topical COMPLICATIONS: none    INDICATIONS FOR PROCEDURE: A 69 y.o. year-old With  [place]   for 2 years, x-rays show bone-on-bone arthritic changes, and osteophytes. Despite conservative measures with observation, anti-inflammatory medicine, narcotics, use of a cane, has severe unremitting pain and can ambulate only a few blocks before resting. Patient desires elective R total hip arthroplasty to decrease pain and increase function. The risks, benefits, and alternatives were discussed at length including but not limited to the risks of infection, bleeding, nerve injury, stiffness, blood clots, the need for revision surgery, cardiopulmonary complications, among others, and they were willing to proceed. Questions answered      PROCEDURE IN DETAIL: The patient was identified by armband,   received preoperative IV antibiotics in the holding area at [facility], taken to the operating room , appropriate anesthetic monitors   were attached and anesthesia was induced with the patient on the gurney. HANA boots were applied to the feet, and the patient  was  transferred to the HANA table with a peroneal post and support underneath the non-operative leg. Theoperative lower extremity was then prepped and draped in the usual sterile fashion from just above the iliac crest to the knee. And a timeout procedure was performed. Kerry Hough. Hardin Negus [facility] was present and scrubbed throughout the case, critical for assistance with, positioning, exposure, retraction, instrumentation, and closure.Skin along incision area was injected with 10 cc of Exparel solution. We then made a 13 cm incision along the interval at the leading edge of the tensor fascia lata of starting at 2 cm lateral to the ASIS. Small bleeders in the skin and subcutaneous tissue identified and cauterized we dissected down to the fascia and made an incision in the fascia allowing [place] to elevate the fascia of the tensor muscle and exploited the interval between the rectus and the tensor fascia lata. A Cobra retractor was then placed along the superior neck of the femur. A cerebellar retractor was used to expose the interval between the tensor fascia lata and the rectus femoris.  We identified and cauterized the ascending branch of the anterior circumflex artery. A second Cobra retractor along the inferior neck of the femur. A small Hohmann retractor was placed underneath the origin of the rectus femoris, giving [place] good medial exposure. Using Ronguers fatty tissue was removed from in front of the anterior capsule. The capsule was then incised, starting out at the superior anterior rim of the acetabulum going laterally along the anterior neck. The capsule was then teed along the neck superiorly and inferiorly. Electrocautery was used to release capsule from the anterior and medial neck of the femur to allow external rotation. Cobra retractors were then placed along the inferior and superior neck allowing [place] to perform a standard neck cut and removed the femoral head with a power corkscrew. We then placed a medium bent homan  retractor in the cotyloid notch and posteriorly along the acetabular rim a narrow Cobra retractor. Exposed labral tissue and osteophytes were then removed. We then sequentially reamed up to a 51 mm basket reamer obtaining good coverage in all quadrants, verified by C-arm imaging. Under C-arm control we then hammered into place a 52 mm Pinnacle cup in 45 of abduction and 15 of anteversion. The cup seated nicely and required no supplemental screws. We then placed a central hole Eliminator and a 0 polyethylene liner. The foot was then externally rotated to 130-140. The limb was extended and adducted to the floor, delivering the proximal femur up into the wound. A medium curved Hohmann retractor was placed over the greater trochanter and a long Homan retractor along the posterior femoral neck completing the exposure and lateralizing the femur. We then performed releases superiorly and and inferiorly of the capsule going back to the pirformis fossa superiorly and to the lesser trochanter inferiorly. We then entered the proximal femur with the box cutting offset chisel followed by, a canal sounder, the chili pepper and broaching up to a 5 broach. This seated nicely and we reamed the calcar. A trial reduction was performed with a 1 mm X 36 ceramic mm head.The limb lengths were excellent the hip was stable in 90 of external rotation. At this point the trial components removed and we hammered into place a # 5 std  Offset Actis stem with Gryption coating. A + 1.5 mm x 36 ceramic head was then hammered into place. The hip was reduced and final C-arm images obtained. The wound was thoroughly irrigated with normal saline solution. We repaired the ant capsule and the tensor fascia lot a with running 0 vicryl suture. the subcutaneous tissue was closed with 2-0 and 3-0 Vicryl suture followed by an Aquacil dressing. At this point the patient was awaken and transferred to hospital gurney without difficulty.   Kerin Salen 05/04/2019, 7:13 AM

## 2019-05-04 NOTE — Evaluation (Signed)
Physical Therapy Evaluation Patient Details Name: Pamela Roth MRN: [id number] DOB: 16-Jun-1950 Today's Date: 05/04/2019   History of Present Illness  s/p Right DA THA. PMH: COPD  Clinical Impression  Pt admitted with above diagnosis.  Pt sat EOB, lateral scooting along EOB with min assist; OOB deferred d/t bil numbness feet and also nausea associated with movement.  Pt currently with functional limitations due to the deficits listed below (see PT Problem List). Pt will benefit from skilled PT to increase their independence and safety with mobility to allow discharge to the venue listed below.       Follow Up Recommendations Follow surgeon's recommendation for DC plan and follow-up therapies    Equipment Recommendations  None recommended by PT    Recommendations for Other Services       Precautions / Restrictions Precautions Precautions: Fall Restrictions Weight Bearing Restrictions: No Other Position/Activity Restrictions: WBAT      Mobility  Bed Mobility Overal bed mobility: Needs Assistance Bed Mobility: Supine to Sit;Sit to Supine     Supine to sit: Min assist Sit to supine: Min assist   General bed mobility comments: assist with LEs on and off bed  Transfers                 General transfer comment: unable d/t nausea  Ambulation/Gait                Stairs            Wheelchair Mobility    Modified Rankin (Stroke Patients Only)       Balance Overall balance assessment: Needs assistance Sitting-balance support: No upper extremity supported;Feet supported Sitting balance-Leahy Scale: Fair                                       Pertinent Vitals/Pain Pain Assessment: 0-10 Pain Score: 1  Pain Location: right hip Pain Descriptors / Indicators: Sore Pain Intervention(s): Limited activity within patient's tolerance;Monitored during session;Premedicated before session;Repositioned    Home Living Family/patient  expects to be discharged to:: Private residence Living Arrangements: Alone Available Help at Discharge: Friend(s) Type of Home: House Home Access: Level entry Entrance Stairs-Rails: Right;Left;Can reach both Entrance Stairs-Number of Steps: back entrance no stairs Home Layout: Two level;Able to live on main level with bedroom/bathroom Home Equipment: Gilford Rile - 2 wheels;Cane - single point Additional Comments: borrowing RW from friend    Prior Function Level of Independence: Independent               Hand Dominance        Extremity/Trunk Assessment   Upper Extremity Assessment Upper Extremity Assessment: Overall WFL for tasks assessed    Lower Extremity Assessment Lower Extremity Assessment: RLE deficits/detail RLE Deficits / Details: ankle WFL. knee and hip AAROM WFL, strength grossly 2+/5 RLE Sensation: decreased light touch(foot) LLE Sensation: decreased light touch(foot)       Communication   Communication: No difficulties  Cognition Arousal/Alertness: Awake/alert Behavior During Therapy: WFL for tasks assessed/performed Overall Cognitive Status: Within Functional Limits for tasks assessed                                        General Comments      Exercises Total Joint Exercises Ankle Circles/Pumps: AROM;10 reps;Both   Assessment/Plan    PT  Assessment Patient needs continued PT services  PT Problem List Decreased strength;Decreased activity tolerance;Decreased knowledge of use of DME;Decreased mobility;Pain       PT Treatment Interventions DME instruction;Therapeutic exercise;Gait training;Functional mobility training;Therapeutic activities;Patient/family education    PT Goals (Current goals can be found in the Care Plan section)  Acute Rehab PT Goals Patient Stated Goal: feel better PT Goal Formulation: With patient Time For Goal Achievement: 05/11/19 Potential to Achieve Goals: Good    Frequency 7X/week   Barriers to  discharge        Co-evaluation               AM-PAC PT "6 Clicks" Mobility  Outcome Measure Help needed turning from your back to your side while in a flat bed without using bedrails?: A Little Help needed moving from lying on your back to sitting on the side of a flat bed without using bedrails?: A Little Help needed moving to and from a bed to a chair (including a wheelchair)?: A Lot Help needed standing up from a chair using your arms (e.g., wheelchair or bedside chair)?: A Lot Help needed to walk in hospital room?: A Lot Help needed climbing 3-5 steps with a railing? : A Lot 6 Click Score: 14    End of Session Equipment Utilized During Treatment: Gait belt Activity Tolerance: Patient tolerated treatment well Patient left: with call bell/phone within reach;in bed;with bed alarm set;with nursing/sitter in room   PT Visit Diagnosis: Difficulty in walking, not elsewhere classified (R26.2)    Time: [id number] PT Time Calculation (min) (ACUTE ONLY): 13 min   Charges:   PT Evaluation $PT Eval Low Complexity: [place], PT   Acute Rehab Dept [facility]): [id number]   05/04/2019   [facility] 05/04/2019, 4:11 PM

## 2019-05-04 NOTE — Anesthesia Preprocedure Evaluation (Addendum)
Anesthesia Evaluation  Patient identified by MRN, date of birth, ID band Patient awake    Reviewed: Allergy & Precautions, NPO status , Patient's Chart, lab work & pertinent test results  Airway Mallampati: III  TM Distance: >3 FB Neck ROM: Full    Dental  (+) Partial Upper   Pulmonary COPD,  COPD inhaler, former smoker,    Pulmonary exam normal breath sounds clear to auscultation       Cardiovascular hypertension, Pt. on medications Normal cardiovascular exam Rhythm:Regular Rate:Normal  ECG: NSR, rate 65   Neuro/Psych PSYCHIATRIC DISORDERS Anxiety Depression negative neurological ROS     GI/Hepatic negative GI ROS, Neg liver ROS,   Endo/Other  negative endocrine ROS  Renal/GU negative Renal ROS     Musculoskeletal  (+) Arthritis ,   Abdominal   Peds  Hematology HLD   Anesthesia Other Findings RIGHT HIP DEGENERTIVE JOINT DISEASE  Reproductive/Obstetrics                            Anesthesia Physical Anesthesia Plan  ASA: III  Anesthesia Plan: Spinal   Post-op Pain Management:    Induction:   PONV Risk Score and Plan: 2 and Ondansetron, Dexamethasone, Propofol infusion and Treatment may vary due to age or medical condition  Airway Management Planned: Simple Face Mask  Additional Equipment:   Intra-op Plan:   Post-operative Plan:   Informed Consent: I have reviewed the patients History and Physical, chart, labs and discussed the procedure including the risks, benefits and alternatives for the proposed anesthesia with the patient or authorized representative who has indicated his/her understanding and acceptance.     Dental advisory given  Plan Discussed with: CRNA  Anesthesia Plan Comments:        Anesthesia Quick Evaluation

## 2019-05-04 NOTE — Transfer of Care (Signed)
Immediate Anesthesia Transfer of Care Note  Patient: Pamela Roth  Procedure(s) Performed: RIGHT TOTAL HIP ARTHROPLASTY ANTERIOR APPROACH (Right Hip)  Patient Location: PACU  Anesthesia Type:MAC and Spinal  Level of Consciousness: drowsy, patient cooperative and responds to stimulation  Airway & Oxygen Therapy: Patient Spontanous Breathing and Patient connected to face mask oxygen  Post-op Assessment: Report given to RN and Post -op Vital signs reviewed and stable  Post vital signs: Reviewed and stable  Last Vitals:  Vitals Value Taken Time  BP 100/58 05/04/19 1137  Temp    Pulse 60 05/04/19 1139  Resp 12 05/04/19 1139  SpO2 100 % 05/04/19 1139  Vitals shown include unvalidated device data.  Last Pain:  Vitals:   05/04/19 0756  TempSrc:   PainSc: 5          Complications: No apparent anesthesia complications

## 2019-05-04 NOTE — Discharge Instructions (Signed)

## 2019-05-05 ENCOUNTER — Encounter: Payer: Self-pay | Admitting: *Deleted

## 2019-05-05 DIAGNOSIS — J309 Allergic rhinitis, unspecified: Secondary | ICD-10-CM | POA: Insufficient documentation

## 2019-05-05 DIAGNOSIS — J449 Chronic obstructive pulmonary disease, unspecified: Secondary | ICD-10-CM | POA: Insufficient documentation

## 2019-05-05 LAB — CBC
HCT: 37 % (ref 36.0–46.0)
Hemoglobin: 11.8 g/dL — ABNORMAL LOW (ref 12.0–15.0)
MCH: 30.4 pg (ref 26.0–34.0)
MCHC: 31.9 g/dL (ref 30.0–36.0)
MCV: 95.4 fL (ref 80.0–100.0)
Platelets: 187 10*3/uL (ref 150–400)
RBC: 3.88 MIL/uL (ref 3.87–5.11)
RDW: 14.7 % (ref 11.5–15.5)
WBC: 11.8 10*3/uL — ABNORMAL HIGH (ref 4.0–10.5)
nRBC: 0 % (ref 0.0–0.2)

## 2019-05-05 LAB — BASIC METABOLIC PANEL
Anion gap: 5 (ref 5–15)
BUN: 12 mg/dL (ref 8–23)
CO2: 25 mmol/L (ref 22–32)
Calcium: 8.6 mg/dL — ABNORMAL LOW (ref 8.9–10.3)
Chloride: 106 mmol/L (ref 98–111)
Creatinine, Ser: 0.95 mg/dL (ref 0.44–1.00)
GFR calc Af Amer: >60 mL/min (ref >60)
GFR calc non Af Amer: >60 mL/min (ref >60)
Glucose, Bld: 146 mg/dL — ABNORMAL HIGH (ref 70–99)
Potassium: 4.1 mmol/L (ref 3.5–5.1)
Sodium: 136 mmol/L (ref 135–145)

## 2019-05-05 NOTE — Progress Notes (Signed)
05/05/19 1600  PT Visit Information   Removed pt O2 at beginning of session, end of session SpO2= 93-94% on RA.  Pt continues to progress slowly although improved from am session. Able to amb short distance (15' x2). Continues to have issues with pain and mild nausea. Feel pt will benefit from another day of PT for safe d/c home. Discussed with RN.    Last PT Received On 05/05/19    Assistance Needed +1  History of Present Illness s/p Right DA THA. PMH: COPD  Subjective Data  Patient Stated Goal feel better  Precautions  Precautions Fall  Restrictions  Other Position/Activity Restrictions WBAT  Pain Assessment  Pain Assessment 0-10  Pain Score 6  Pain Location right hip  Pain Descriptors / Indicators Sore  Pain Intervention(s) Limited activity within patient's tolerance;Monitored during session;Premedicated before session;Repositioned  Cognition  Arousal/Alertness Awake/alert  Behavior During Therapy WFL for tasks assessed/performed  Overall Cognitive Status Within Functional Limits for tasks assessed  Bed Mobility  Overal bed mobility Needs Assistance  Bed Mobility Sit to Supine  Sit to supine Min assist  General bed mobility comments assist with RLE, instructed in use of gait belt as leg lifter  Transfers  Overall transfer level Needs assistance  Equipment used Rolling walker (2 wheeled)  Transfers Sit to/from Stand  Sit to Stand Min assist;Min guard  General transfer comment cues for hand placement   Ambulation/Gait  Ambulation/Gait assistance Min assist  Gait Distance (Feet) 15 Feet (x2)  Assistive device Rolling walker (2 wheeled)  Gait Pattern/deviations Step-to pattern;Decreased stance time - right  General Gait Details cues for sequence and RW position   Total Joint Exercises  Ankle Circles/Pumps AROM;10 reps;Both  Quad Sets AROM;Both;10 reps  Hip ABduction/ADduction AROM;AAROM;Right;10 reps  Heel Slides AROM;AAROM;Right;10 reps  PT - End of Session   Equipment Utilized During Treatment Gait belt  Activity Tolerance Patient tolerated treatment well  Patient left in bed;with call bell/phone within reach;with bed alarm set   PT - Assessment/Plan  PT Plan Current plan remains appropriate  PT Visit Diagnosis Difficulty in walking, not elsewhere classified (R26.2)  PT Frequency (ACUTE ONLY) 7X/week  Follow Up Recommendations Follow surgeon's recommendation for DC plan and follow-up therapies  PT equipment None recommended by PT  AM-PAC PT "6 Clicks" Mobility Outcome Measure (Version 2)  Help needed turning from your back to your side while in a flat bed without using bedrails? 3  Help needed moving from lying on your back to sitting on the side of a flat bed without using bedrails? 3  Help needed moving to and from a bed to a chair (including a wheelchair)? 3  Help needed standing up from a chair using your arms (e.g., wheelchair or bedside chair)? 3  Help needed to walk in hospital room? 3  Help needed climbing 3-5 steps with a railing?  2  6 Click Score 17  Consider Recommendation of Discharge To: Home with [facility]  PT Goal Progression  Progress towards PT goals Progressing toward goals  Acute Rehab PT Goals  PT Goal Formulation With patient  Time For Goal Achievement 05/11/19  Potential to Achieve Goals Good  PT Time Calculation  PT Start Time (ACUTE ONLY) 1525  PT Stop Time (ACUTE ONLY) 1549  PT Time Calculation (min) (ACUTE ONLY) 24 min  PT General Charges  $$ ACUTE PT VISIT 1 Visit  PT Treatments  $Gait Training 8-22 mins  $Therapeutic Exercise 8-22 mins  Baxter Flattery, PT   Acute  Rehab Dept [facility]): [phone]   05/05/2019

## 2019-05-05 NOTE — Progress Notes (Signed)
PATIENT ID: Pamela Roth  MRN: [id number]  DOB/AGE:  09/06/1950 / 69 y.o.  1 Day Post-Op Procedure(s) (LRB): RIGHT TOTAL HIP ARTHROPLASTY ANTERIOR APPROACH (Right)    PROGRESS NOTE Subjective: Patient is alert, oriented, no Nausea, no Vomiting, yes passing gas, . Taking PO well. Denies SOB, Chest or Calf Pain. Using Incentive Spirometer, PAS in place. Ambulate WBAT Patient reports pain as  2/10  .    Objective: Vital signs in last 24 hours: Vitals:   05/04/19 1941 05/04/19 2220 05/05/19 0155 05/05/19 0543  BP:  (Abnormal) 147/78 130/66 (Abnormal) 92/56  Pulse:  67 72 67  Resp:  18 18 17   Temp:  98.3 F (36.8 C) 98.7 F (37.1 C) 98.2 F (36.8 C)  TempSrc:  Oral Oral Oral  SpO2: 99% 100% 100% 99%  Weight:          Intake/Output from previous day: I/O last 3 completed shifts: In: 4087.1 [P.O.:300; I.V.:3437.1; IV Piggyback:350] Out: 940 [Urine:740; Blood:200]   Intake/Output this shift: No intake/output data recorded.   LABORATORY DATA: Recent Labs    05/05/19 0244  WBC 11.8*  HGB 11.8*  HCT 37.0  PLT 187  NA 136  K 4.1  CL 106  CO2 25  BUN 12  CREATININE 0.95  GLUCOSE 146*  CALCIUM 8.6*    Examination: Neurologically intact ABD soft Neurovascular intact Sensation intact distally Intact pulses distally Dorsiflexion/Plantar flexion intact Incision: dressing C/D/I No cellulitis present Compartment soft} XR AP&Lat of hip shows well placed\fixed THA  Assessment:   1 Day Post-Op Procedure(s) (LRB): RIGHT TOTAL HIP ARTHROPLASTY ANTERIOR APPROACH (Right) ADDITIONAL DIAGNOSIS:  Expected Acute Blood Loss Anemia, COPD  Patient's anticipated LOS is less than 2 midnights, meeting these requirements: - Younger than 41 - Lives within 1 hour of care - Has a competent adult at home to recover with post-op recover - NO history of  - Chronic pain requiring opiods  - Diabetes  - Coronary Artery Disease  - Heart failure  - Heart attack  - Stroke  -  DVT/VTE  - Cardiac arrhythmia  - Respiratory Failure/COPD  - Renal failure  - Anemia  - Advanced Liver disease       Plan: PT/OT WBAT, THA  DVT Prophylaxis: SCDx72 hrs, ASA 81 mg BID x 2 weeks  DISCHARGE PLAN: Home, today after PT  DISCHARGE NEEDS: HHPT, Walker and 3-in-1 comode seat

## 2019-05-05 NOTE — Progress Notes (Signed)
Physical Therapy Treatment Patient Details Name: Pamela Roth MRN: [id number] DOB: 01-Jul-1950 Today's Date: 05/05/2019    History of Present Illness s/p Right DA THA. PMH: COPD    PT Comments    Pt progressing slowly d/t incr pain and nausea (despite meds). Will see again this pm, unsure if she will be ready for d/c today  Follow Up Recommendations  Follow surgeon's recommendation for DC plan and follow-up therapies     Equipment Recommendations  None recommended by PT    Recommendations for Other Services       Precautions / Restrictions Precautions Precautions: Fall Restrictions Weight Bearing Restrictions: No Other Position/Activity Restrictions: WBAT    Mobility  Bed Mobility               General bed mobility comments: pt in recliner  Transfers Overall transfer level: Needs assistance Equipment used: Rolling walker (2 wheeled) Transfers: Sit to/from Stand Sit to Stand: Min assist         General transfer comment: cues for hand placement   Ambulation/Gait Ambulation/Gait assistance: Min assist Gait Distance (Feet): 12 Feet Assistive device: Rolling walker (2 wheeled) Gait Pattern/deviations: Step-to pattern;Decreased stance time - right     General Gait Details: cues for sequence and RW position, limited by nausea    Stairs             Wheelchair Mobility    Modified Rankin (Stroke Patients Only)       Balance                                            Cognition Arousal/Alertness: Awake/alert Behavior During Therapy: WFL for tasks assessed/performed Overall Cognitive Status: Within Functional Limits for tasks assessed                                        Exercises Total Joint Exercises Ankle Circles/Pumps: AROM;10 reps;Both Quad Sets: AROM;Both;5 reps    General Comments        Pertinent Vitals/Pain Pain Assessment: 0-10 Pain Score: 8  Pain Location: right hip Pain  Descriptors / Indicators: Sore Pain Intervention(s): Limited activity within patient's tolerance;Monitored during session;Premedicated before session    Home Living                      Prior Function            PT Goals (current goals can now be found in the care plan section) Acute Rehab PT Goals Patient Stated Goal: feel better PT Goal Formulation: With patient Time For Goal Achievement: 05/11/19 Potential to Achieve Goals: Good Progress towards PT goals: Progressing toward goals(slowly)    Frequency    7X/week      PT Plan Current plan remains appropriate    Co-evaluation              AM-PAC PT "6 Clicks" Mobility   Outcome Measure  Help needed turning from your back to your side while in a flat bed without using bedrails?: A Little Help needed moving from lying on your back to sitting on the side of a flat bed without using bedrails?: A Little Help needed moving to and from a bed to a chair (including a wheelchair)?: A Little Help needed standing up from  a chair using your arms (e.g., wheelchair or bedside chair)?: A Little Help needed to walk in hospital room?: A Little Help needed climbing 3-5 steps with a railing? : A Lot 6 Click Score: 17    End of Session Equipment Utilized During Treatment: Gait belt Activity Tolerance: Patient tolerated treatment well Patient left: in chair;with call bell/phone within reach;with chair alarm set   PT Visit Diagnosis: Difficulty in walking, not elsewhere classified (R26.2)     Time: [id number] PT Time Calculation (min) (ACUTE ONLY): 16 min  Charges:  $Gait Training: 8-22 mins                      Jaira Canady, PT   Acute Rehab Dept [facility]): [id number]   05/05/2019    [facility] 05/05/2019, 1:23 PM

## 2019-05-05 NOTE — Discharge Summary (Signed)
Patient ID: Pamela Roth MRN: [id number] DOB/AGE: 03/22/1950 69 y.o.  Admit date: 05/04/2019 Discharge date: 05/05/2019  Admission Diagnoses:  Principal Problem:   Osteoarthritis of right hip Active Problems:   Status post total hip replacement, right   Discharge Diagnoses:  Same  Past Medical History:  Diagnosis Date  . Anxiety   . COPD (chronic obstructive pulmonary disease) ([place])   . Depression   . Hyperlipidemia   . Hypertension   . Multinodular goiter     Surgeries: Procedure(s): RIGHT TOTAL HIP ARTHROPLASTY ANTERIOR APPROACH on 05/04/2019   Consultants:   Discharged Condition: Improved  Hospital Course: Pamela Roth is an 69 y.o. female who was admitted 05/04/2019 for operative treatment ofOsteoarthritis of right hip. Patient has severe unremitting pain that affects sleep, daily activities, and work/hobbies. After pre-op clearance the patient was taken to the operating room on 05/04/2019 and underwent  Procedure(s): RIGHT TOTAL HIP ARTHROPLASTY ANTERIOR APPROACH.    Patient was given perioperative antibiotics:  Anti-infectives (From admission, onward)   Start     Dose/Rate Route Frequency Ordered Stop   05/04/19 0745  ceFAZolin (ANCEF) IVPB 2g/100 mL premix     2 g 200 mL/hr over 30 Minutes Intravenous On call to O.R. 05/04/19 [id number] 05/04/19 0956       Patient was given sequential compression devices, early ambulation, and chemoprophylaxis to prevent DVT.  Patient benefited maximally from hospital stay and there were no complications.    Recent vital signs:  Patient Vitals for the past 24 hrs:  BP Temp Temp src Pulse Resp SpO2  05/05/19 0543 (Abnormal) 92/56 98.2 F (36.8 C) Oral 67 17 99 %  05/05/19 0155 130/66 98.7 F (37.1 C) Oral 72 18 100 %  05/04/19 2220 (Abnormal) 147/78 98.3 F (36.8 C) Oral 67 18 100 %  05/04/19 1941 no documentation no documentation no documentation no documentation no documentation 99 %  05/04/19 1631 140/80 98.4 F  (36.9 C) no documentation (Abnormal) 55 16 100 %  05/04/19 1535 131/83 99 F (37.2 C) no documentation (Abnormal) 56 16 99 %  05/04/19 1440 (Abnormal) 142/77 97.6 F (36.4 C) no documentation (Abnormal) 56 16 100 %  05/04/19 1349 (Abnormal) 142/81 (Abnormal) 97.5 F (36.4 C) Oral (Abnormal) 56 14 100 %  05/04/19 1330 no documentation (Abnormal) 97.5 F (36.4 C) no documentation (Abnormal) 55 12 100 %  05/04/19 1315 131/80 no documentation no documentation (Abnormal) 58 11 100 %  05/04/19 1300 128/85 no documentation no documentation (Abnormal) 59 17 99 %  05/04/19 1245 133/79 no documentation no documentation (Abnormal) 52 10 99 %  05/04/19 1230 134/84 no documentation no documentation (Abnormal) 57 15 100 %  05/04/19 1215 126/77 no documentation no documentation (Abnormal) 51 11 100 %  05/04/19 1200 119/80 no documentation no documentation (Abnormal) 57 15 100 %  05/04/19 1145 103/65 no documentation no documentation 63 19 100 %  05/04/19 1137 (Abnormal) 100/58 (Abnormal) 97.2 F (36.2 C) no documentation 65 17 100 %     Recent laboratory studies:  Recent Labs    05/05/19 0244  WBC 11.8*  HGB 11.8*  HCT 37.0  PLT 187  NA 136  K 4.1  CL 106  CO2 25  BUN 12  CREATININE 0.95  GLUCOSE 146*  CALCIUM 8.6*     Discharge Medications:   Allergies as of 05/05/2019   No Known Allergies     Medication List    Stop taking these medications   meloxicam 15 MG tablet Commonly  known as: MOBIC     Take these medications   aspirin EC 81 MG tablet Take 1 tablet (81 mg total) by mouth 2 (two) times daily.   gabapentin 100 MG capsule Commonly known as: NEURONTIN Take 100 mg by mouth at bedtime.   losartan 100 MG tablet Commonly known as: COZAAR Take 100 mg by mouth at bedtime.   naphazoline-glycerin 0.012-0.2 % Soln Commonly known as: CLEAR EYES REDNESS Place 1 drop into both eyes 4 (four) times daily as needed for eye irritation.   oxyCODONE-acetaminophen 5-325 MG  tablet Commonly known as: PERCOCET/ROXICET Take 1 tablet by mouth every 4 (four) hours as needed for severe pain.   SALONPAS EX Place 1 patch onto the skin daily as needed (hip pain.).   simvastatin 20 MG tablet Commonly known as: ZOCOR Take 20 mg by mouth at bedtime.   Symbicort 80-4.5 MCG/ACT inhaler Generic drug: budesonide-formoterol Inhale 2 puffs into the lungs in the morning and at bedtime.   tiZANidine 2 MG tablet Commonly known as: ZANAFLEX Take 1 tablet (2 mg total) by mouth every 6 (six) hours as needed.   Ventolin HFA 108 (90 Base) MCG/ACT inhaler Generic drug: albuterol Inhale 1 puff into the lungs every 4 (four) hours as needed for wheezing or shortness of breath.        Durable Medical Equipment  (From admission, onward)         Start     Ordered   05/04/19 1355  DME Walker rolling  Once    Question:  Patient needs a walker to treat with the following condition  Answer:  Status post right hip replacement   05/04/19 1354   05/04/19 1355  DME 3 n 1  Once     05/04/19 1354          Diagnostic Studies: DG Chest 2 View  Result Date: 04/27/2019 CLINICAL DATA:  Preop history of COPD and hypertension EXAM: CHEST - 2 VIEW COMPARISON:  12/26/2018 FINDINGS: The heart size and mediastinal contours are within normal limits. Mild aortic atherosclerosis. Both lungs are clear. The visualized skeletal structures are unremarkable. IMPRESSION: No active cardiopulmonary disease. Electronically Signed   By: Donavan Foil M.D.   On: 04/27/2019 21:38   DG C-Arm 1-60 Min-No Report  Result Date: 05/04/2019 Fluoroscopy was utilized by the requesting physician.  No radiographic interpretation.   DG HIP OPERATIVE UNILAT WITH PELVIS RIGHT  Result Date: 05/04/2019 CLINICAL DATA:  69 year old female undergoing right hip arthroplasty. EXAM: OPERATIVE RIGHT HIP (WITH PELVIS IF PERFORMED) 3 VIEWS TECHNIQUE: Fluoroscopic spot image(s) were submitted for interpretation  post-operatively. COMPARISON:  Right hip series 10/29/2008. FINDINGS: 3 intraoperative fluoroscopic AP spot views of the lower pelvis and right hip. Bipolar hip arthroplasty is placed. The hardware appears intact with normal AP alignment. No unexpected osseous changes. FLUOROSCOPY TIME:  0 minutes 11 seconds IMPRESSION: Intraoperative images of bipolar right hip arthroplasty with no adverse features. Electronically Signed   By: Genevie Ann M.D.   On: 05/04/2019 11:19    Disposition: Discharge disposition: 01-Home or Self Care       Discharge Instructions    Call MD / Call 911   Complete by: As directed    If you experience chest pain or shortness of breath, CALL 911 and be transported to the hospital emergency room.  If you develope a fever above 101 F, pus (white drainage) or increased drainage or redness at the wound, or calf pain, call your surgeon's office.  Constipation Prevention   Complete by: As directed    Drink plenty of fluids.  Prune juice may be helpful.  You may use a stool softener, such as Colace (over the counter) 100 mg twice a day.  Use MiraLax (over the counter) for constipation as needed.   Diet - low sodium heart healthy   Complete by: As directed    Increase activity slowly as tolerated   Complete by: As directed       Follow-up Information    Frederik Pear, MD. Go on 05/14/2019.   Specialty: Orthopedic Surgery Why: Your appointment is scheduled for 9:15.  Contact information: [place] [address] [phone]        [facility] up.   Specialty: [place] Why: You will be seen at home for 5 HHPT visits prior to starting Outpatient PT  Contact information: [address] [place] [address] [phone]        [facility], [place]. Go on 05/13/2019.   Why: You are scheduled to start outpatient physical therapy at 10:40. Please go over to the PT side and complete your paperwork after seeing  MD  Contact information: Physical Therapy [place] [address] [phone]        Frederik Pear, MD In 2 weeks.   Specialty: Orthopedic Surgery Contact information: [place] [address] [phone]            Signed: Kerin Salen 05/05/2019, 8:03 AM

## 2019-05-06 ENCOUNTER — Other Ambulatory Visit: Payer: Self-pay

## 2019-05-06 DIAGNOSIS — Z87891 Personal history of nicotine dependence: Secondary | ICD-10-CM | POA: Diagnosis not present

## 2019-05-06 DIAGNOSIS — R7611 Nonspecific reaction to tuberculin skin test without active tuberculosis: Secondary | ICD-10-CM | POA: Diagnosis present

## 2019-05-06 DIAGNOSIS — D62 Acute posthemorrhagic anemia: Secondary | ICD-10-CM | POA: Diagnosis not present

## 2019-05-06 DIAGNOSIS — Z9181 History of falling: Secondary | ICD-10-CM | POA: Diagnosis not present

## 2019-05-06 DIAGNOSIS — J449 Chronic obstructive pulmonary disease, unspecified: Secondary | ICD-10-CM | POA: Diagnosis present

## 2019-05-06 DIAGNOSIS — M1611 Unilateral primary osteoarthritis, right hip: Secondary | ICD-10-CM | POA: Diagnosis present

## 2019-05-06 DIAGNOSIS — Z972 Presence of dental prosthetic device (complete) (partial): Secondary | ICD-10-CM | POA: Diagnosis not present

## 2019-05-06 DIAGNOSIS — Z7951 Long term (current) use of inhaled steroids: Secondary | ICD-10-CM | POA: Diagnosis not present

## 2019-05-06 DIAGNOSIS — Z791 Long term (current) use of non-steroidal anti-inflammatories (NSAID): Secondary | ICD-10-CM | POA: Diagnosis not present

## 2019-05-06 DIAGNOSIS — I10 Essential (primary) hypertension: Secondary | ICD-10-CM | POA: Diagnosis present

## 2019-05-06 DIAGNOSIS — Z79899 Other long term (current) drug therapy: Secondary | ICD-10-CM | POA: Diagnosis not present

## 2019-05-06 LAB — CBC
HCT: 35.4 % — ABNORMAL LOW (ref 36.0–46.0)
Hemoglobin: 11.8 g/dL — ABNORMAL LOW (ref 12.0–15.0)
MCH: 31 pg (ref 26.0–34.0)
MCHC: 33.3 g/dL (ref 30.0–36.0)
MCV: 92.9 fL (ref 80.0–100.0)
Platelets: 180 10*3/uL (ref 150–400)
RBC: 3.81 MIL/uL — ABNORMAL LOW (ref 3.87–5.11)
RDW: 15 % (ref 11.5–15.5)
WBC: 13.4 10*3/uL — ABNORMAL HIGH (ref 4.0–10.5)
nRBC: 0 % (ref 0.0–0.2)

## 2019-05-06 NOTE — Progress Notes (Signed)
Reviewed AVS and answered all questions.  Patient alert and oriented, skin warm and dry.  Taken to main lobby to meet friend who was there to take patient home.  Helped into the vehicle with no concerns or complaints.

## 2019-05-06 NOTE — Progress Notes (Signed)
   05/06/19 1400  PT Visit Information  Last PT Received On 05/06/19  Pt progressing very well. Ready for  D/c from PT standpoint   Assistance Needed +1  History of Present Illness s/p Right DA THA. PMH: COPD  Subjective Data  Patient Stated Goal feel better  Precautions  Precautions Fall  Restrictions  Other Position/Activity Restrictions WBAT  Pain Assessment  Pain Assessment 0-10  Pain Score 3  Pain Location right hip  Pain Descriptors / Indicators Sore  Pain Intervention(s) Limited activity within patient's tolerance;Monitored during session;Premedicated before session;Repositioned  Cognition  Arousal/Alertness Awake/alert  Behavior During Therapy WFL for tasks assessed/performed  Overall Cognitive Status Within Functional Limits for tasks assessed  Bed Mobility  Overal bed mobility Needs Assistance  Bed Mobility Supine to Sit;Sit to Supine  Supine to sit Supervision  Sit to supine Supervision  General bed mobility comments supervision for safety, pt able to demo  use of gait belt as leg lifter  Transfers  Overall transfer level Needs assistance  Equipment used Rolling walker (2 wheeled)  Transfers Sit to/from Stand  Sit to Stand Supervision  General transfer comment cues for hand placement  and RLE position  Ambulation/Gait  Ambulation/Gait assistance Min guard  Gait Distance (Feet) 30 Feet  Assistive device Rolling walker (2 wheeled)  Gait Pattern/deviations Step-to pattern;Decreased stance time - right  General Gait Details cues for sequence and RW position   Stairs Yes  Stairs assistance Min guard  Stair Management Two rails;Forwards  Number of Stairs 3  General stair comments cues for sequence and safety  PT - End of Session  Equipment Utilized During Treatment Gait belt  Activity Tolerance Patient tolerated treatment well  Patient left in bed;with call bell/phone within reach;with bed alarm set   PT - Assessment/Plan  PT Plan Current plan remains  appropriate  PT Visit Diagnosis Difficulty in walking, not elsewhere classified (R26.2)  PT Frequency (ACUTE ONLY) 7X/week  Follow Up Recommendations Follow surgeon's recommendation for DC plan and follow-up therapies  PT equipment None recommended by PT  AM-PAC PT "6 Clicks" Mobility Outcome Measure (Version 2)  Help needed turning from your back to your side while in a flat bed without using bedrails? 3  Help needed moving from lying on your back to sitting on the side of a flat bed without using bedrails? 3  Help needed moving to and from a bed to a chair (including a wheelchair)? 3  Help needed standing up from a chair using your arms (e.g., wheelchair or bedside chair)? 3  Help needed to walk in hospital room? 3  Help needed climbing 3-5 steps with a railing?  3  6 Click Score 18  Consider Recommendation of Discharge To: Home with [facility]  PT Goal Progression  Progress towards PT goals Progressing toward goals  Acute Rehab PT Goals  PT Goal Formulation With patient  Time For Goal Achievement 05/11/19  Potential to Achieve Goals Good  PT Time Calculation  PT Start Time (ACUTE ONLY) 1400  PT Stop Time (ACUTE ONLY) 1410  PT Time Calculation (min) (ACUTE ONLY) 10 min  PT General Charges  $$ ACUTE PT VISIT 1 Visit  PT Treatments  $Gait Training 8-22 mins

## 2019-05-06 NOTE — Progress Notes (Signed)
PATIENT ID: Pamela Roth  MRN: [id number]  DOB/AGE:  1950/08/10 / 69 y.o.  2 Days Post-Op Procedure(s) (LRB): RIGHT TOTAL HIP ARTHROPLASTY ANTERIOR APPROACH (Right)    PROGRESS NOTE Subjective: Patient is alert, oriented, no Nausea, no Vomiting, yes passing gas, . Taking PO well. Denies SOB, Chest or Calf Pain. Using Incentive Spirometer, PAS in place. Ambulate WBAT with pt walking 15 ft x 2 Patient reports pain as  Less than a 5/10  .    Objective: Vital signs in last 24 hours: Vitals:   05/05/19 0951 05/05/19 2200 05/06/19 0535 05/06/19 0538  BP: 124/65 122/67 117/78 117/78  Pulse: 64 71 (!) 57 (!) 57  Resp: 16 18  18   Temp: 98.1 F (36.7 C) 98.1 F (36.7 C)  98.4 F (36.9 C)  TempSrc: Oral Oral  Oral  SpO2: 90% 93%  97%  Weight:          Intake/Output from previous day: I/O last 3 completed shifts: In: 2308 [P.O.:820; I.V.:1438; IV Piggyback:50] Out: 1350 [id number]   Intake/Output this shift: No intake/output data recorded.   LABORATORY DATA: Recent Labs    05/05/19 0244 05/06/19 0242  WBC 11.8* 13.4*  HGB 11.8* 11.8*  HCT 37.0 35.4*  PLT 187 180  NA 136  --   K 4.1  --   CL 106  --   CO2 25  --   BUN 12  --   CREATININE 0.95  --   GLUCOSE 146*  --   CALCIUM 8.6*  --     Examination: Neurologically intact Neurovascular intact Sensation intact distally Intact pulses distally Dorsiflexion/Plantar flexion intact Incision: dressing C/D/I and no drainage No cellulitis present Compartment soft} XR AP&Lat of hip shows well placed\fixed THA  Assessment:   2 Days Post-Op Procedure(s) (LRB): RIGHT TOTAL HIP ARTHROPLASTY ANTERIOR APPROACH (Right) ADDITIONAL DIAGNOSIS:  Expected Acute Blood Loss Anemia, COPD Anticipated LOS equal to or greater than 2 midnights due to - Age 91 and older with one or more of the following:  - Obesity  - Expected need for hospital services (PT, OT, Nursing) required for safe  discharge  - Anticipated need for  postoperative skilled nursing care or inpatient rehab  - Active co-morbidities: Respiratory Failure/COPD OR   - Unanticipated findings during/Post Surgery: Slow post-op progression: GI, pain control, mobility       Plan: PT/OT WBAT, THA  DVT Prophylaxis: SCDx72 hrs, ASA 81 mg BID x 2 weeks  DISCHARGE PLAN: Home, later today once pt meets therapy goals  DISCHARGE NEEDS: HHPT, Walker and 3-in-1 comode seat

## 2019-05-06 NOTE — TOC Transition Note (Signed)
Transition of Care [facility]) - CM/SW Discharge Note   Patient Details  Name: LEWANNA MEDEARIS MRN: [id number] Date of Birth: 09/22/1950  Transition of Care [facility]) CM/SW Contact:  Lia Hopping, [place] Phone Number: 05/06/2019, 10:02 AM   Clinical Narrative:    Orhthopedic Bundle Plan of Care-confirm with patient          Patient Goals and CMS Choice        Discharge Placement                       Discharge Plan and Services                          HH Arranged: PT [place] Agency: [facility]        Social Determinants of Health (SDOH) Interventions     Readmission Risk Interventions No flowsheet data found.

## 2019-05-06 NOTE — Progress Notes (Signed)
Physical Therapy Treatment Patient Details Name: Pamela Roth MRN: [id number] DOB: [birth year] Today's Date: 05/06/2019    History of Present Illness s/p Right DA THA. PMH: COPD    PT Comments    Pt progressing well, improved tolerance to activity with no nausea. Will see for a second session and should be ready to d/c at that time.  Follow Up Recommendations  Follow surgeon's recommendation for DC plan and follow-up therapies     Equipment Recommendations  None recommended by PT    Recommendations for Other Services       Precautions / Restrictions Precautions Precautions: Fall Restrictions Weight Bearing Restrictions: No Other Position/Activity Restrictions: WBAT    Mobility  Bed Mobility Overal bed mobility: Needs Assistance Bed Mobility: Supine to Sit       Sit to supine: Supervision   General bed mobility comments: supervision for safety, pt able to demo  use of gait belt as leg lifter  Transfers Overall transfer level: Needs assistance Equipment used: Rolling walker (2 wheeled) Transfers: Sit to/from Stand Sit to Stand: Min guard         General transfer comment: cues for hand placement  and RLE position  Ambulation/Gait Ambulation/Gait assistance: Min guard Gait Distance (Feet): 50 Feet Assistive device: Rolling walker (2 wheeled) Gait Pattern/deviations: Step-to pattern;Decreased stance time - right     General Gait Details: cues for sequence and RW position    Stairs             Wheelchair Mobility    Modified Rankin (Stroke Patients Only)       Balance                                            Cognition Arousal/Alertness: Awake/alert Behavior During Therapy: WFL for tasks assessed/performed Overall Cognitive Status: Within Functional Limits for tasks assessed                                        Exercises Total Joint Exercises Ankle Circles/Pumps: AROM;10 reps;Both Quad Sets:  AROM;Both;10 reps Heel Slides: AROM;AAROM;Right;10 reps Hip ABduction/ADduction: AROM;AAROM;Right;10 reps    General Comments        Pertinent Vitals/Pain Pain Assessment: 0-10 Pain Score: 4  Pain Location: right hip Pain Descriptors / Indicators: Sore Pain Intervention(s): Limited activity within patient's tolerance;Monitored during session;Premedicated before session;Repositioned    Home Living                      Prior Function            PT Goals (current goals can now be found in the care plan section) Acute Rehab PT Goals Patient Stated Goal: feel better PT Goal Formulation: With patient Time For Goal Achievement: 05/11/19 Potential to Achieve Goals: Good Progress towards PT goals: Progressing toward goals    Frequency    7X/week      PT Plan Current plan remains appropriate    Co-evaluation              AM-PAC PT "6 Clicks" Mobility   Outcome Measure  Help needed turning from your back to your side while in a flat bed without using bedrails?: A Little Help needed moving from lying on your back to sitting on the side of  a flat bed without using bedrails?: A Little Help needed moving to and from a bed to a chair (including a wheelchair)?: A Little Help needed standing up from a chair using your arms (e.g., wheelchair or bedside chair)?: A Little Help needed to walk in hospital room?: A Little Help needed climbing 3-5 steps with a railing? : A Lot 6 Click Score: 17    End of Session Equipment Utilized During Treatment: Gait belt Activity Tolerance: Patient tolerated treatment well Patient left: in bed;with call bell/phone within reach;with bed alarm set   PT Visit Diagnosis: Difficulty in walking, not elsewhere classified (R26.2)     Time: 1125-1140 PT Time Calculation (min) (ACUTE ONLY): 15 min  Charges:  $Gait Training: 8-22 mins                     Baxter Flattery, PT   Acute Rehab Dept [facility]):  [phone]   05/06/2019    [facility] 05/06/2019, 11:57 AM

## 2019-05-07 DIAGNOSIS — Z87891 Personal history of nicotine dependence: Secondary | ICD-10-CM | POA: Diagnosis not present

## 2019-05-07 DIAGNOSIS — E042 Nontoxic multinodular goiter: Secondary | ICD-10-CM | POA: Diagnosis not present

## 2019-05-07 DIAGNOSIS — Z7982 Long term (current) use of aspirin: Secondary | ICD-10-CM | POA: Diagnosis not present

## 2019-05-07 DIAGNOSIS — Z96641 Presence of right artificial hip joint: Secondary | ICD-10-CM | POA: Diagnosis not present

## 2019-05-07 DIAGNOSIS — I10 Essential (primary) hypertension: Secondary | ICD-10-CM | POA: Diagnosis not present

## 2019-05-07 DIAGNOSIS — Z471 Aftercare following joint replacement surgery: Secondary | ICD-10-CM | POA: Diagnosis not present

## 2019-05-07 DIAGNOSIS — F329 Major depressive disorder, single episode, unspecified: Secondary | ICD-10-CM | POA: Diagnosis not present

## 2019-05-07 DIAGNOSIS — F419 Anxiety disorder, unspecified: Secondary | ICD-10-CM | POA: Diagnosis not present

## 2019-05-07 DIAGNOSIS — J449 Chronic obstructive pulmonary disease, unspecified: Secondary | ICD-10-CM | POA: Diagnosis not present

## 2019-05-07 DIAGNOSIS — E785 Hyperlipidemia, unspecified: Secondary | ICD-10-CM | POA: Diagnosis not present

## 2019-05-08 DIAGNOSIS — J449 Chronic obstructive pulmonary disease, unspecified: Secondary | ICD-10-CM | POA: Diagnosis not present

## 2019-05-08 DIAGNOSIS — I10 Essential (primary) hypertension: Secondary | ICD-10-CM | POA: Diagnosis not present

## 2019-05-08 DIAGNOSIS — F419 Anxiety disorder, unspecified: Secondary | ICD-10-CM | POA: Diagnosis not present

## 2019-05-08 DIAGNOSIS — F329 Major depressive disorder, single episode, unspecified: Secondary | ICD-10-CM | POA: Diagnosis not present

## 2019-05-08 DIAGNOSIS — E042 Nontoxic multinodular goiter: Secondary | ICD-10-CM | POA: Diagnosis not present

## 2019-05-08 DIAGNOSIS — Z471 Aftercare following joint replacement surgery: Secondary | ICD-10-CM | POA: Diagnosis not present

## 2019-05-09 DIAGNOSIS — F329 Major depressive disorder, single episode, unspecified: Secondary | ICD-10-CM | POA: Diagnosis not present

## 2019-05-09 DIAGNOSIS — I10 Essential (primary) hypertension: Secondary | ICD-10-CM | POA: Diagnosis not present

## 2019-05-09 DIAGNOSIS — J449 Chronic obstructive pulmonary disease, unspecified: Secondary | ICD-10-CM | POA: Diagnosis not present

## 2019-05-09 DIAGNOSIS — F419 Anxiety disorder, unspecified: Secondary | ICD-10-CM | POA: Diagnosis not present

## 2019-05-09 DIAGNOSIS — E042 Nontoxic multinodular goiter: Secondary | ICD-10-CM | POA: Diagnosis not present

## 2019-05-09 DIAGNOSIS — Z471 Aftercare following joint replacement surgery: Secondary | ICD-10-CM | POA: Diagnosis not present

## 2019-05-11 DIAGNOSIS — I10 Essential (primary) hypertension: Secondary | ICD-10-CM | POA: Diagnosis not present

## 2019-05-11 DIAGNOSIS — J449 Chronic obstructive pulmonary disease, unspecified: Secondary | ICD-10-CM | POA: Diagnosis not present

## 2019-05-11 DIAGNOSIS — E042 Nontoxic multinodular goiter: Secondary | ICD-10-CM | POA: Diagnosis not present

## 2019-05-11 DIAGNOSIS — F419 Anxiety disorder, unspecified: Secondary | ICD-10-CM | POA: Diagnosis not present

## 2019-05-11 DIAGNOSIS — F329 Major depressive disorder, single episode, unspecified: Secondary | ICD-10-CM | POA: Diagnosis not present

## 2019-05-11 DIAGNOSIS — Z471 Aftercare following joint replacement surgery: Secondary | ICD-10-CM | POA: Diagnosis not present

## 2019-05-12 DIAGNOSIS — E042 Nontoxic multinodular goiter: Secondary | ICD-10-CM | POA: Diagnosis not present

## 2019-05-12 DIAGNOSIS — F419 Anxiety disorder, unspecified: Secondary | ICD-10-CM | POA: Diagnosis not present

## 2019-05-12 DIAGNOSIS — J449 Chronic obstructive pulmonary disease, unspecified: Secondary | ICD-10-CM | POA: Diagnosis not present

## 2019-05-12 DIAGNOSIS — F329 Major depressive disorder, single episode, unspecified: Secondary | ICD-10-CM | POA: Diagnosis not present

## 2019-05-12 DIAGNOSIS — Z471 Aftercare following joint replacement surgery: Secondary | ICD-10-CM | POA: Diagnosis not present

## 2019-05-12 DIAGNOSIS — I10 Essential (primary) hypertension: Secondary | ICD-10-CM | POA: Diagnosis not present

## 2019-05-14 DIAGNOSIS — Z9889 Other specified postprocedural states: Secondary | ICD-10-CM | POA: Diagnosis not present

## 2019-05-14 DIAGNOSIS — M6281 Muscle weakness (generalized): Secondary | ICD-10-CM | POA: Diagnosis not present

## 2019-05-14 DIAGNOSIS — M25651 Stiffness of right hip, not elsewhere classified: Secondary | ICD-10-CM | POA: Diagnosis not present

## 2019-05-14 DIAGNOSIS — Z96641 Presence of right artificial hip joint: Secondary | ICD-10-CM | POA: Diagnosis not present

## 2019-05-14 DIAGNOSIS — M1611 Unilateral primary osteoarthritis, right hip: Secondary | ICD-10-CM | POA: Diagnosis not present

## 2019-05-18 DIAGNOSIS — Z96641 Presence of right artificial hip joint: Secondary | ICD-10-CM | POA: Diagnosis not present

## 2019-05-18 DIAGNOSIS — M6281 Muscle weakness (generalized): Secondary | ICD-10-CM | POA: Diagnosis not present

## 2019-05-18 DIAGNOSIS — M25651 Stiffness of right hip, not elsewhere classified: Secondary | ICD-10-CM | POA: Diagnosis not present

## 2019-05-22 DIAGNOSIS — M6281 Muscle weakness (generalized): Secondary | ICD-10-CM | POA: Diagnosis not present

## 2019-05-22 DIAGNOSIS — Z96641 Presence of right artificial hip joint: Secondary | ICD-10-CM | POA: Diagnosis not present

## 2019-05-22 DIAGNOSIS — M25651 Stiffness of right hip, not elsewhere classified: Secondary | ICD-10-CM | POA: Diagnosis not present

## 2019-05-25 DIAGNOSIS — M6281 Muscle weakness (generalized): Secondary | ICD-10-CM | POA: Diagnosis not present

## 2019-05-25 DIAGNOSIS — Z96641 Presence of right artificial hip joint: Secondary | ICD-10-CM | POA: Diagnosis not present

## 2019-05-25 DIAGNOSIS — M25651 Stiffness of right hip, not elsewhere classified: Secondary | ICD-10-CM | POA: Diagnosis not present

## 2019-05-27 DIAGNOSIS — M25651 Stiffness of right hip, not elsewhere classified: Secondary | ICD-10-CM | POA: Diagnosis not present

## 2019-05-27 DIAGNOSIS — M6281 Muscle weakness (generalized): Secondary | ICD-10-CM | POA: Diagnosis not present

## 2019-05-27 DIAGNOSIS — Z96641 Presence of right artificial hip joint: Secondary | ICD-10-CM | POA: Diagnosis not present

## 2019-06-01 DIAGNOSIS — M25651 Stiffness of right hip, not elsewhere classified: Secondary | ICD-10-CM | POA: Diagnosis not present

## 2019-06-01 DIAGNOSIS — M6281 Muscle weakness (generalized): Secondary | ICD-10-CM | POA: Diagnosis not present

## 2019-06-01 DIAGNOSIS — Z96641 Presence of right artificial hip joint: Secondary | ICD-10-CM | POA: Diagnosis not present

## 2019-06-03 DIAGNOSIS — M6281 Muscle weakness (generalized): Secondary | ICD-10-CM | POA: Diagnosis not present

## 2019-06-03 DIAGNOSIS — M25651 Stiffness of right hip, not elsewhere classified: Secondary | ICD-10-CM | POA: Diagnosis not present

## 2019-06-03 DIAGNOSIS — Z96641 Presence of right artificial hip joint: Secondary | ICD-10-CM | POA: Diagnosis not present

## 2019-06-06 DIAGNOSIS — Z471 Aftercare following joint replacement surgery: Secondary | ICD-10-CM | POA: Diagnosis not present

## 2019-06-09 DIAGNOSIS — M25651 Stiffness of right hip, not elsewhere classified: Secondary | ICD-10-CM | POA: Diagnosis not present

## 2019-06-09 DIAGNOSIS — Z96641 Presence of right artificial hip joint: Secondary | ICD-10-CM | POA: Diagnosis not present

## 2019-06-09 DIAGNOSIS — M6281 Muscle weakness (generalized): Secondary | ICD-10-CM | POA: Diagnosis not present

## 2019-06-11 DIAGNOSIS — Z96641 Presence of right artificial hip joint: Secondary | ICD-10-CM | POA: Diagnosis not present

## 2019-06-11 DIAGNOSIS — M25651 Stiffness of right hip, not elsewhere classified: Secondary | ICD-10-CM | POA: Diagnosis not present

## 2019-06-11 DIAGNOSIS — M6281 Muscle weakness (generalized): Secondary | ICD-10-CM | POA: Diagnosis not present

## 2019-06-15 DIAGNOSIS — Z96641 Presence of right artificial hip joint: Secondary | ICD-10-CM | POA: Diagnosis not present

## 2019-06-15 DIAGNOSIS — M25651 Stiffness of right hip, not elsewhere classified: Secondary | ICD-10-CM | POA: Diagnosis not present

## 2019-06-15 DIAGNOSIS — M6281 Muscle weakness (generalized): Secondary | ICD-10-CM | POA: Diagnosis not present

## 2019-06-17 DIAGNOSIS — M25651 Stiffness of right hip, not elsewhere classified: Secondary | ICD-10-CM | POA: Diagnosis not present

## 2019-06-17 DIAGNOSIS — M6281 Muscle weakness (generalized): Secondary | ICD-10-CM | POA: Diagnosis not present

## 2019-06-17 DIAGNOSIS — Z96641 Presence of right artificial hip joint: Secondary | ICD-10-CM | POA: Diagnosis not present

## 2019-08-04 DIAGNOSIS — M25651 Stiffness of right hip, not elsewhere classified: Secondary | ICD-10-CM | POA: Diagnosis not present

## 2019-08-04 DIAGNOSIS — M6281 Muscle weakness (generalized): Secondary | ICD-10-CM | POA: Diagnosis not present

## 2019-08-04 DIAGNOSIS — Z96641 Presence of right artificial hip joint: Secondary | ICD-10-CM | POA: Diagnosis not present

## 2019-08-11 DIAGNOSIS — Z96641 Presence of right artificial hip joint: Secondary | ICD-10-CM | POA: Diagnosis not present

## 2019-08-11 DIAGNOSIS — M6281 Muscle weakness (generalized): Secondary | ICD-10-CM | POA: Diagnosis not present

## 2019-08-11 DIAGNOSIS — M25651 Stiffness of right hip, not elsewhere classified: Secondary | ICD-10-CM | POA: Diagnosis not present

## 2019-08-13 DIAGNOSIS — M6281 Muscle weakness (generalized): Secondary | ICD-10-CM | POA: Diagnosis not present

## 2019-08-13 DIAGNOSIS — Z96641 Presence of right artificial hip joint: Secondary | ICD-10-CM | POA: Diagnosis not present

## 2019-08-13 DIAGNOSIS — M25651 Stiffness of right hip, not elsewhere classified: Secondary | ICD-10-CM | POA: Diagnosis not present

## 2019-08-14 DIAGNOSIS — M25651 Stiffness of right hip, not elsewhere classified: Secondary | ICD-10-CM | POA: Diagnosis not present

## 2019-08-14 DIAGNOSIS — M6281 Muscle weakness (generalized): Secondary | ICD-10-CM | POA: Diagnosis not present

## 2019-08-14 DIAGNOSIS — Z96641 Presence of right artificial hip joint: Secondary | ICD-10-CM | POA: Diagnosis not present

## 2019-08-18 DIAGNOSIS — Z96641 Presence of right artificial hip joint: Secondary | ICD-10-CM | POA: Diagnosis not present

## 2019-08-18 DIAGNOSIS — M6281 Muscle weakness (generalized): Secondary | ICD-10-CM | POA: Diagnosis not present

## 2019-08-18 DIAGNOSIS — M25651 Stiffness of right hip, not elsewhere classified: Secondary | ICD-10-CM | POA: Diagnosis not present

## 2019-08-20 DIAGNOSIS — M6281 Muscle weakness (generalized): Secondary | ICD-10-CM | POA: Diagnosis not present

## 2019-08-20 DIAGNOSIS — M25651 Stiffness of right hip, not elsewhere classified: Secondary | ICD-10-CM | POA: Diagnosis not present

## 2019-08-20 DIAGNOSIS — Z96641 Presence of right artificial hip joint: Secondary | ICD-10-CM | POA: Diagnosis not present

## 2019-08-24 DIAGNOSIS — M25651 Stiffness of right hip, not elsewhere classified: Secondary | ICD-10-CM | POA: Diagnosis not present

## 2019-08-24 DIAGNOSIS — M6281 Muscle weakness (generalized): Secondary | ICD-10-CM | POA: Diagnosis not present

## 2019-08-24 DIAGNOSIS — Z96641 Presence of right artificial hip joint: Secondary | ICD-10-CM | POA: Diagnosis not present

## 2019-08-25 DIAGNOSIS — M7061 Trochanteric bursitis, right hip: Secondary | ICD-10-CM | POA: Diagnosis not present

## 2019-08-25 DIAGNOSIS — M542 Cervicalgia: Secondary | ICD-10-CM | POA: Diagnosis not present

## 2019-08-25 DIAGNOSIS — M1711 Unilateral primary osteoarthritis, right knee: Secondary | ICD-10-CM | POA: Diagnosis not present

## 2019-08-25 DIAGNOSIS — M25561 Pain in right knee: Secondary | ICD-10-CM | POA: Diagnosis not present

## 2019-08-25 DIAGNOSIS — Z96641 Presence of right artificial hip joint: Secondary | ICD-10-CM | POA: Diagnosis not present

## 2019-09-15 DIAGNOSIS — M6281 Muscle weakness (generalized): Secondary | ICD-10-CM | POA: Diagnosis not present

## 2019-09-15 DIAGNOSIS — M25651 Stiffness of right hip, not elsewhere classified: Secondary | ICD-10-CM | POA: Diagnosis not present

## 2019-09-15 DIAGNOSIS — Z96641 Presence of right artificial hip joint: Secondary | ICD-10-CM | POA: Diagnosis not present

## 2019-10-15 DIAGNOSIS — M25551 Pain in right hip: Secondary | ICD-10-CM | POA: Diagnosis not present

## 2019-10-15 DIAGNOSIS — Z96641 Presence of right artificial hip joint: Secondary | ICD-10-CM | POA: Diagnosis not present

## 2019-10-19 DIAGNOSIS — E7800 Pure hypercholesterolemia, unspecified: Secondary | ICD-10-CM | POA: Diagnosis not present

## 2019-10-19 DIAGNOSIS — M1990 Unspecified osteoarthritis, unspecified site: Secondary | ICD-10-CM | POA: Diagnosis not present

## 2019-10-19 DIAGNOSIS — R413 Other amnesia: Secondary | ICD-10-CM | POA: Diagnosis not present

## 2019-10-19 DIAGNOSIS — Z23 Encounter for immunization: Secondary | ICD-10-CM | POA: Diagnosis not present

## 2019-10-19 DIAGNOSIS — J309 Allergic rhinitis, unspecified: Secondary | ICD-10-CM | POA: Diagnosis not present

## 2019-10-19 DIAGNOSIS — F324 Major depressive disorder, single episode, in partial remission: Secondary | ICD-10-CM | POA: Diagnosis not present

## 2019-10-19 DIAGNOSIS — Z0000 Encounter for general adult medical examination without abnormal findings: Secondary | ICD-10-CM | POA: Diagnosis not present

## 2019-10-19 DIAGNOSIS — E042 Nontoxic multinodular goiter: Secondary | ICD-10-CM | POA: Diagnosis not present

## 2019-10-19 DIAGNOSIS — J449 Chronic obstructive pulmonary disease, unspecified: Secondary | ICD-10-CM | POA: Diagnosis not present

## 2019-10-19 DIAGNOSIS — Z1389 Encounter for screening for other disorder: Secondary | ICD-10-CM | POA: Diagnosis not present

## 2019-10-19 DIAGNOSIS — I10 Essential (primary) hypertension: Secondary | ICD-10-CM | POA: Diagnosis not present

## 2019-10-21 DIAGNOSIS — Z23 Encounter for immunization: Secondary | ICD-10-CM | POA: Diagnosis not present

## 2019-10-27 DIAGNOSIS — R059 Cough, unspecified: Secondary | ICD-10-CM | POA: Diagnosis not present

## 2019-10-27 DIAGNOSIS — J40 Bronchitis, not specified as acute or chronic: Secondary | ICD-10-CM | POA: Diagnosis not present

## 2019-10-27 DIAGNOSIS — Z8709 Personal history of other diseases of the respiratory system: Secondary | ICD-10-CM | POA: Diagnosis not present

## 2019-10-27 DIAGNOSIS — U071 COVID-19: Secondary | ICD-10-CM | POA: Diagnosis not present

## 2019-10-28 ENCOUNTER — Inpatient Hospital Stay (HOSPITAL_COMMUNITY): Payer: Medicare Other

## 2019-10-28 ENCOUNTER — Other Ambulatory Visit: Payer: Self-pay

## 2019-10-28 ENCOUNTER — Encounter (HOSPITAL_COMMUNITY): Payer: Self-pay | Admitting: Internal Medicine

## 2019-10-28 ENCOUNTER — Inpatient Hospital Stay (HOSPITAL_COMMUNITY)
Admission: EM | Admit: 2019-10-28 | Discharge: 2019-11-02 | DRG: 177 | Disposition: A | Discharge status: Home Health | Payer: Medicare Other | Attending: Internal Medicine | Admitting: Internal Medicine

## 2019-10-28 ENCOUNTER — Emergency Department (HOSPITAL_COMMUNITY): Payer: Medicare Other

## 2019-10-28 DIAGNOSIS — D696 Thrombocytopenia, unspecified: Secondary | ICD-10-CM | POA: Diagnosis present

## 2019-10-28 DIAGNOSIS — J9601 Acute respiratory failure with hypoxia: Secondary | ICD-10-CM | POA: Diagnosis present

## 2019-10-28 DIAGNOSIS — J449 Chronic obstructive pulmonary disease, unspecified: Secondary | ICD-10-CM | POA: Diagnosis present

## 2019-10-28 DIAGNOSIS — Z7951 Long term (current) use of inhaled steroids: Secondary | ICD-10-CM | POA: Diagnosis not present

## 2019-10-28 DIAGNOSIS — F32A Depression, unspecified: Secondary | ICD-10-CM | POA: Diagnosis present

## 2019-10-28 DIAGNOSIS — Z7982 Long term (current) use of aspirin: Secondary | ICD-10-CM | POA: Diagnosis not present

## 2019-10-28 DIAGNOSIS — U071 COVID-19: Secondary | ICD-10-CM | POA: Diagnosis present

## 2019-10-28 DIAGNOSIS — I517 Cardiomegaly: Secondary | ICD-10-CM | POA: Diagnosis not present

## 2019-10-28 DIAGNOSIS — J440 Chronic obstructive pulmonary disease with acute lower respiratory infection: Secondary | ICD-10-CM | POA: Diagnosis present

## 2019-10-28 DIAGNOSIS — D72819 Decreased white blood cell count, unspecified: Secondary | ICD-10-CM | POA: Diagnosis present

## 2019-10-28 DIAGNOSIS — E041 Nontoxic single thyroid nodule: Secondary | ICD-10-CM | POA: Diagnosis present

## 2019-10-28 DIAGNOSIS — Z87891 Personal history of nicotine dependence: Secondary | ICD-10-CM | POA: Diagnosis not present

## 2019-10-28 DIAGNOSIS — I10 Essential (primary) hypertension: Secondary | ICD-10-CM | POA: Diagnosis present

## 2019-10-28 DIAGNOSIS — R0602 Shortness of breath: Secondary | ICD-10-CM | POA: Diagnosis not present

## 2019-10-28 DIAGNOSIS — Z96641 Presence of right artificial hip joint: Secondary | ICD-10-CM | POA: Diagnosis present

## 2019-10-28 DIAGNOSIS — J9811 Atelectasis: Secondary | ICD-10-CM | POA: Diagnosis not present

## 2019-10-28 DIAGNOSIS — F419 Anxiety disorder, unspecified: Secondary | ICD-10-CM | POA: Diagnosis present

## 2019-10-28 DIAGNOSIS — J309 Allergic rhinitis, unspecified: Secondary | ICD-10-CM | POA: Diagnosis present

## 2019-10-28 DIAGNOSIS — E785 Hyperlipidemia, unspecified: Secondary | ICD-10-CM | POA: Diagnosis present

## 2019-10-28 DIAGNOSIS — J1282 Pneumonia due to coronavirus disease 2019: Secondary | ICD-10-CM | POA: Diagnosis present

## 2019-10-28 DIAGNOSIS — N179 Acute kidney failure, unspecified: Secondary | ICD-10-CM | POA: Diagnosis present

## 2019-10-28 DIAGNOSIS — Z79899 Other long term (current) drug therapy: Secondary | ICD-10-CM

## 2019-10-28 LAB — FIBRINOGEN: Fibrinogen: 373 mg/dL (ref 210–475)

## 2019-10-28 LAB — CBC WITH DIFFERENTIAL/PLATELET
Abs Immature Granulocytes: 0.01 10*3/uL (ref 0.00–0.07)
Basophils Absolute: 0 10*3/uL (ref 0.0–0.1)
Basophils Relative: 0 %
Eosinophils Absolute: 0 10*3/uL (ref 0.0–0.5)
Eosinophils Relative: 0 %
HCT: 43.2 % (ref 36.0–46.0)
Hemoglobin: 14.3 g/dL (ref 12.0–15.0)
Immature Granulocytes: 0 %
Lymphocytes Relative: 27 %
Lymphs Abs: 0.9 10*3/uL (ref 0.7–4.0)
MCH: 30.2 pg (ref 26.0–34.0)
MCHC: 33.1 g/dL (ref 30.0–36.0)
MCV: 91.3 fL (ref 80.0–100.0)
Monocytes Absolute: 0.1 10*3/uL (ref 0.1–1.0)
Monocytes Relative: 3 %
Neutro Abs: 2.4 10*3/uL (ref 1.7–7.7)
Neutrophils Relative %: 70 %
Platelets: 127 10*3/uL — ABNORMAL LOW (ref 150–400)
RBC: 4.73 MIL/uL (ref 3.87–5.11)
RDW: 15.8 % — ABNORMAL HIGH (ref 11.5–15.5)
WBC: 3.5 10*3/uL — ABNORMAL LOW (ref 4.0–10.5)
nRBC: 0 % (ref 0.0–0.2)

## 2019-10-28 LAB — COMPREHENSIVE METABOLIC PANEL
ALT: 23 U/L (ref 0–44)
AST: 46 U/L — ABNORMAL HIGH (ref 15–41)
Albumin: 3.2 g/dL — ABNORMAL LOW (ref 3.5–5.0)
Alkaline Phosphatase: 39 U/L (ref 38–126)
Anion gap: 11 (ref 5–15)
BUN: 21 mg/dL (ref 8–23)
CO2: 19 mmol/L — ABNORMAL LOW (ref 22–32)
Calcium: 8.7 mg/dL — ABNORMAL LOW (ref 8.9–10.3)
Chloride: 102 mmol/L (ref 98–111)
Creatinine, Ser: 1.29 mg/dL — ABNORMAL HIGH (ref 0.44–1.00)
GFR, Estimated: 42 mL/min — ABNORMAL LOW (ref >60)
Glucose, Bld: 124 mg/dL — ABNORMAL HIGH (ref 70–99)
Potassium: 4.5 mmol/L (ref 3.5–5.1)
Sodium: 132 mmol/L — ABNORMAL LOW (ref 135–145)
Total Bilirubin: 0.7 mg/dL (ref 0.3–1.2)
Total Protein: 6.5 g/dL (ref 6.5–8.1)

## 2019-10-28 LAB — RESPIRATORY PANEL BY RT PCR (FLU A&B, COVID)
Influenza A by PCR: NEGATIVE
Influenza B by PCR: NEGATIVE
SARS Coronavirus 2 by RT PCR: POSITIVE — AB

## 2019-10-28 LAB — PROCALCITONIN: Procalcitonin: 0.19 ng/mL

## 2019-10-28 LAB — FERRITIN: Ferritin: 172 ng/mL (ref 11–307)

## 2019-10-28 LAB — C-REACTIVE PROTEIN: CRP: 0.5 mg/dL (ref <1.0)

## 2019-10-28 LAB — LACTIC ACID, PLASMA: Lactic Acid, Venous: 1.5 mmol/L (ref 0.5–1.9)

## 2019-10-28 LAB — TROPONIN I (HIGH SENSITIVITY)
Troponin I (High Sensitivity): 120 ng/L (ref <18)
Troponin I (High Sensitivity): 134 ng/L (ref <18)

## 2019-10-28 LAB — HEPATITIS B SURFACE ANTIGEN: Hepatitis B Surface Ag: NONREACTIVE

## 2019-10-28 LAB — LACTATE DEHYDROGENASE: LDH: 316 U/L — ABNORMAL HIGH (ref 98–192)

## 2019-10-28 LAB — D-DIMER, QUANTITATIVE: D-Dimer, Quant: 0.94 ug/mL-FEU — ABNORMAL HIGH (ref 0.00–0.50)

## 2019-10-28 LAB — HIV ANTIBODY (ROUTINE TESTING W REFLEX): HIV Screen 4th Generation wRfx: NONREACTIVE

## 2019-10-28 LAB — BRAIN NATRIURETIC PEPTIDE: B Natriuretic Peptide: 37.3 pg/mL (ref 0.0–100.0)

## 2019-10-28 LAB — TRIGLYCERIDES: Triglycerides: 69 mg/dL (ref <150)

## 2019-10-28 MED ORDER — HYDROCOD POLST-CPM POLST ER 10-8 MG/5ML PO SUER: 5.0000 mL | Freq: Two times a day (BID) | ORAL | Status: DC | PRN

## 2019-10-28 MED ORDER — SODIUM CHLORIDE 0.9 % IV SOLN
200.0000 mg | Freq: Once | INTRAVENOUS | Status: AC
  Administered 2019-10-28: 200 mg via INTRAVENOUS
  Filled 2019-10-28: qty 40

## 2019-10-28 MED ORDER — SODIUM CHLORIDE 0.9 % IV SOLN: 100.0000 mg | Freq: Every day | INTRAVENOUS | Status: DC

## 2019-10-28 MED ORDER — ZINC SULFATE 220 (50 ZN) MG PO CAPS
220.0000 mg | ORAL_CAPSULE | Freq: Every day | ORAL | Status: DC
  Administered 2019-10-28 – 2019-11-02 (×6): 220 mg via ORAL
  Filled 2019-10-28 (×6): qty 1

## 2019-10-28 MED ORDER — GUAIFENESIN-DM 100-10 MG/5ML PO SYRP
10.0000 mL | ORAL_SOLUTION | ORAL | Status: DC | PRN
  Administered 2019-10-30: 10 mL via ORAL
  Filled 2019-10-28: qty 10

## 2019-10-28 MED ORDER — IOHEXOL 350 MG/ML SOLN
100.0000 mL | Freq: Once | INTRAVENOUS | Status: AC | PRN
  Administered 2019-10-28: 71 mL via INTRAVENOUS

## 2019-10-28 MED ORDER — ASCORBIC ACID 500 MG PO TABS
500.0000 mg | ORAL_TABLET | Freq: Every day | ORAL | Status: DC
  Administered 2019-10-28 – 2019-11-02 (×6): 500 mg via ORAL
  Filled 2019-10-28 (×6): qty 1

## 2019-10-28 MED ORDER — ACETAMINOPHEN 325 MG PO TABS
650.0000 mg | ORAL_TABLET | Freq: Four times a day (QID) | ORAL | Status: DC | PRN
  Administered 2019-10-28: 650 mg via ORAL
  Filled 2019-10-28: qty 2

## 2019-10-28 MED ORDER — SODIUM CHLORIDE 0.9 % IV SOLN
100.0000 mg | INTRAVENOUS | Status: AC
  Administered 2019-10-29 – 2019-11-01 (×4): 100 mg via INTRAVENOUS
  Filled 2019-10-28 (×4): qty 20

## 2019-10-28 MED ORDER — ENOXAPARIN SODIUM 40 MG/0.4ML ~~LOC~~ SOLN
40.0000 mg | SUBCUTANEOUS | Status: DC
  Administered 2019-10-28 – 2019-11-02 (×6): 40 mg via SUBCUTANEOUS
  Filled 2019-10-28 (×6): qty 0.4

## 2019-10-28 MED ORDER — SODIUM CHLORIDE 0.9 % IV SOLN: 200.0000 mg | Freq: Once | INTRAVENOUS | Status: DC

## 2019-10-28 MED ORDER — ONDANSETRON HCL 4 MG PO TABS: 4.0000 mg | ORAL_TABLET | Freq: Four times a day (QID) | ORAL | Status: DC | PRN

## 2019-10-28 MED ORDER — ONDANSETRON HCL 4 MG/2ML IJ SOLN: 4.0000 mg | Freq: Four times a day (QID) | INTRAMUSCULAR | Status: DC | PRN

## 2019-10-28 MED ORDER — METHYLPREDNISOLONE SODIUM SUCC 125 MG IJ SOLR
1.0000 mg/kg | Freq: Two times a day (BID) | INTRAMUSCULAR | Status: AC
  Administered 2019-10-28 – 2019-10-30 (×5): 79.375 mg via INTRAVENOUS
  Filled 2019-10-28: qty 2
  Filled 2019-10-28: qty 1.27
  Filled 2019-10-28 (×4): qty 2

## 2019-10-28 MED ORDER — SODIUM CHLORIDE 0.9 % IV SOLN
1000.0000 mL | INTRAVENOUS | Status: DC
  Administered 2019-10-28: 1000 mL via INTRAVENOUS

## 2019-10-28 MED ORDER — PREDNISONE 20 MG PO TABS: 50.0000 mg | ORAL_TABLET | Freq: Every day | ORAL | Status: DC

## 2019-10-28 NOTE — ED Triage Notes (Signed)
Pt arrives POV from Dr's. Office with complaints feeling "off". Pt has been feeling weak and tired for 3-4 days and tested positive for COVID 19 on 10/27/19. Pt denies dizziness, chest pain, loss of taste and smelll, n/v/d. Feels SOB with exertion. Placed on 2 L of O2 upon arrival to room. O2 sat 95%. VSS.

## 2019-10-28 NOTE — ED Notes (Signed)
Ambulated pt in room with pulse ox on room air. Pt initially maintained sats between 90-100% while walking approximately 25 ft. While walking back to the bed pt suddenly began to breathe more labored and felt like her "heart was racing". Pt's pulse maintained in the 80s. O2 sat decreased to 88%. While pt was settling into bed pt's O2 sat lowered further to 75%. 2 L of O2 placed back onto patient and O2 sat improved to 93% and pt's breathing improved and felt "less labored". Dr. Gilford Raid notified of this.

## 2019-10-28 NOTE — ED Notes (Signed)
Please call pt's daughter Rashad Obeid [phone] with update

## 2019-10-28 NOTE — ED Provider Notes (Signed)
[facility] EMERGENCY DEPARTMENT Provider Note   CSN: [id number] Arrival date & time: 10/28/19  4128     History No chief complaint on file.   Pamela Roth is a 69 y.o. female.  Pt presents to the ED today with sob.  She was diagnosed with Covid yesterday.  She went to [facility] today and was told to come here for "treatment."  Pt said she gets very sob with ambulation.  She is unsure how long she's had sx of Covid.  She thinks she may have been exposed about 2 weeks ago at a church function.  She had her first Covid vaccine dose on 10/13.  She does have a hx of COPD, but does not use home O2.        Past Medical History:  Diagnosis Date  . Anxiety   . COPD (chronic obstructive pulmonary disease) ([place])   . Depression   . Hyperlipidemia   . Hypertension   . Multinodular goiter     Patient Active Problem List   Diagnosis Date Noted  . Hypertension   . Acute hypoxemic respiratory failure ([place])   . AKI (acute kidney injury) ([place])   . Allergic rhinitis 05/05/2019  . COPD (chronic obstructive pulmonary disease) ([place]) 05/05/2019  . 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 Osteoarthritis of right hip 05/01/2019  . 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 CHRONIC OBSTRUCTIVE PULMONARY DISEASE, ACUTE EXACERBATION 03/06/2007  . BRONCHITIS 11/08/2006  . REACTIVE AIRWAY DISEASE 11/08/2006  . Hyperlipidemia 11/07/2006  . PERIODONTITIS, ACUTE 11/07/2006  . POSITIVE PPD 11/07/2006    Past Surgical History:  Procedure Laterality Date  . PARTIAL HYSTERECTOMY  1980s  . TOTAL HIP ARTHROPLASTY Right 05/04/2019   Procedure: RIGHT TOTAL HIP ARTHROPLASTY ANTERIOR APPROACH;  Surgeon: Frederik Pear, MD;  Location: [facility];  Service: Orthopedics;  Laterality: Right;     OB History   No obstetric history on file.     Family History  Problem Relation Age of Onset  . Schizophrenia Mother   . Dementia Mother     Social History   Tobacco Use  . Smoking  status: Former Smoker    Quit date: 01/09/2008    Years since quitting: 11.8  . Smokeless tobacco: Never Used  Vaping Use  . Vaping Use: Never used  Substance Use Topics  . Alcohol use: No    Alcohol/week: 0.0 standard drinks  . Drug use: No    Home Medications Prior to Admission medications   Medication Sig Start Date End Date Taking? Authorizing Provider  aspirin EC 81 MG tablet Take 1 tablet (81 mg total) by mouth 2 (two) times daily. 05/04/19   Leighton Parody, PA-C  Camphor-Menthol-Methyl Sal [facility] EX) Place 1 patch onto the skin daily as needed (hip pain.).    [provider]  gabapentin (NEURONTIN) 100 MG capsule Take 100 mg by mouth at bedtime. 03/27/19   [provider]  losartan (COZAAR) 100 MG tablet Take 100 mg by mouth at bedtime. 03/18/19   [provider]  naphazoline-glycerin (CLEAR EYES REDNESS) 0.012-0.2 % SOLN Place 1 drop into both eyes 4 (four) times daily as needed for eye irritation.    [provider]  oxyCODONE-acetaminophen (PERCOCET/ROXICET) 5-325 MG tablet Take 1 tablet by mouth every 4 (four) hours as needed for severe pain. 05/04/19   Leighton Parody, PA-C  simvastatin (ZOCOR) 20 MG tablet Take 20 mg by mouth at bedtime.  04/26/14   [provider]  SYMBICORT 80-4.5 MCG/ACT inhaler Inhale 2 puffs into the lungs in the morning and at bedtime.  05/16/14   [provider]  tiZANidine (ZANAFLEX) 2 MG tablet Take 1 tablet (2 mg total) by mouth every 6 (six) hours as needed. 05/04/19   Leighton Parody, PA-C  VENTOLIN HFA 108 (90 Base) MCG/ACT inhaler Inhale 1 puff into the lungs every 4 (four) hours as needed for wheezing or shortness of breath. 01/07/19   [provider]    Allergies    Patient has no known allergies.  Review of Systems   Review of Systems  Respiratory: Positive for shortness of breath.   All other systems reviewed and are negative.   Physical Exam Updated Vital Signs BP 112/76    Pulse 72   Resp 20   Ht 5\' 8"  (1.727 m)   Wt 79.4 kg   SpO2 92%   BMI 26.61 kg/m   Physical Exam Vitals and nursing note reviewed.  Constitutional:      Appearance: Normal appearance.  HENT:     Head: Normocephalic and atraumatic.     Right Ear: External ear normal.     Left Ear: External ear normal.     Nose: Nose normal.     Mouth/Throat:     Mouth: Mucous membranes are moist.     Pharynx: Oropharynx is clear.  Eyes:     Extraocular Movements: Extraocular movements intact.     Conjunctiva/sclera: Conjunctivae normal.     Pupils: Pupils are equal, round, and reactive to light.  Cardiovascular:     Rate and Rhythm: Normal rate and regular rhythm.     Pulses: Normal pulses.     Heart sounds: Normal heart sounds.  Pulmonary:     Effort: Tachypnea present.  Abdominal:     General: Abdomen is flat. Bowel sounds are normal.     Palpations: Abdomen is soft.  Musculoskeletal:        General: Normal range of motion.     Cervical back: Normal range of motion and neck supple.  Skin:    General: Skin is warm.     Capillary Refill: Capillary refill takes less than 2 seconds.  Neurological:     General: No focal deficit present.     Mental Status: She is alert.  Psychiatric:        Mood and Affect: Mood normal.     ED Results / Procedures / Treatments   Labs (all labs ordered are listed, but only abnormal results are displayed) Labs Reviewed  RESPIRATORY PANEL BY RT PCR (FLU A&B, COVID) - Abnormal; Notable for the following components:      Result Value   SARS Coronavirus 2 by RT PCR POSITIVE (*)    All other components within normal limits  CBC WITH DIFFERENTIAL/PLATELET - Abnormal; Notable for the following components:   WBC 3.5 (*)    RDW 15.8 (*)    Platelets 127 (*)    All other components within normal limits  COMPREHENSIVE METABOLIC PANEL - Abnormal; Notable for the following components:   Sodium 132 (*)    CO2 19 (*)    Glucose, Bld 124 (*)    Creatinine,  Ser 1.29 (*)    Calcium 8.7 (*)    Albumin 3.2 (*)    AST 46 (*)    GFR, Estimated 42 (*)    All other components within normal limits  D-DIMER, QUANTITATIVE (NOT AT [facility]) - Abnormal; Notable for the following components:  D-Dimer, Quant 0.94 (*)    All other components within normal limits  LACTATE DEHYDROGENASE - Abnormal; Notable for the following components:   LDH 316 (*)    All other components within normal limits  CULTURE, BLOOD (ROUTINE X 2)  CULTURE, BLOOD (ROUTINE X 2)  LACTIC ACID, PLASMA  PROCALCITONIN  FERRITIN  TRIGLYCERIDES  FIBRINOGEN  C-REACTIVE PROTEIN    EKG EKG Interpretation  Date/Time:  Wednesday October 28 2019 08:53:02 EDT Ventricular Rate:  68 PR Interval:    QRS Duration: 89 QT Interval:  378 QTC Calculation: 402 R Axis:   71 Text Interpretation: Sinus rhythm No significant change since last tracing Confirmed by Isla Pence [phone]) on 10/28/2019 8:54:31 AM   Radiology DG Chest Port 1 View  Result Date: 10/28/2019 CLINICAL DATA:  Shortness of breath EXAM: PORTABLE CHEST 1 VIEW COMPARISON:  April 27, 2019 FINDINGS: There is mild bibasilar atelectasis. The lungs elsewhere are clear. Heart is mildly enlarged with pulmonary vascularity normal. No adenopathy. No bone lesions. There is aortic atherosclerosis. IMPRESSION: Mild bibasilar atelectasis. No edema or airspace opacity. Mild cardiomegaly. Aortic Atherosclerosis (ICD10-I70.0). Electronically Signed   By: Lowella Grip III M.D.   On: 10/28/2019 08:52    Procedures Procedures (including critical care time)  Medications Ordered in ED Medications  0.9 %  sodium chloride infusion (1,000 mLs Intravenous New Bag/Given 10/28/19 0904)  remdesivir 200 mg in sodium chloride 0.9% 250 mL IVPB (0 mg Intravenous Stopped 10/28/19 1301)    Followed by  remdesivir 100 mg in sodium chloride 0.9 % 100 mL IVPB (has no administration in time range)    ED Course  I have reviewed the triage vital signs  and the nursing notes.  Pertinent labs & imaging results that were available during my care of the patient were reviewed by me and considered in my medical decision making (see chart for details).    MDM Rules/Calculators/A&P                          Pt ambulated around the room.  She felt like her heart was racing and developed more sob.  Her O2 dropped as low as 75%.  She was put on 2L oxygen via .  She is feeling better with the oxygen.   CXR clear, so CTA ordered to r/o PE.  This is still pending on admission.  Pt d/w Dr. Doristine Bosworth ([facility]) for admission.  CRITICAL CARE Performed by: Isla Pence   Total critical care time: 30  minutes  Critical care time was exclusive of separately billable procedures and treating other patients.  Critical care was necessary to treat or prevent imminent or life-threatening deterioration.  Critical care was time spent personally by me on the following activities: development of treatment plan with patient and/or surrogate as well as nursing, discussions with consultants, evaluation of patient's response to treatment, examination of patient, obtaining history from patient or surrogate, ordering and performing treatments and interventions, ordering and review of laboratory studies, ordering and review of radiographic studies, pulse oximetry and re-evaluation of patient's condition.  Pamela Roth was evaluated in Emergency Department on 10/28/2019 for the symptoms described in the history of present illness. She was evaluated in the context of the global COVID-19 pandemic, which necessitated consideration that the patient might be at risk for infection with the SARS-CoV-2 virus that causes COVID-19. Institutional protocols and algorithms that pertain to the evaluation of patients at risk for COVID-19 are in  a state of rapid change based on information released by regulatory bodies including the CDC and federal and state organizations. These policies  and algorithms were followed during the patient's care in the ED. Final Clinical Impression(s) / ED Diagnoses Final diagnoses:  COVID-19  Acute respiratory failure with hypoxia [facility])    Rx / DC Orders ED Discharge Orders    None       Isla Pence, MD 10/28/19 [phone]

## 2019-10-28 NOTE — H&P (Signed)
History and Physical    Pamela Roth [id number]:[id number] DOB: [birth year] DOA: 10/28/2019  PCP: Kelton Pillar, MD  Patient coming from: Home  I have personally briefly reviewed patient's old medical records in [place]  Chief Complaint: Covid positive, exertional shortness of breath and generalized weakness  HPI: Pamela Roth is a 69 y.o. female with medical history significant of hypertension, hyperlipidemia, COPD not on oxygen at home presents to emergency department with exertional shortness of breath, generalized weakness.  She tested positive for COVID-19 on 10/19.  Patient tells me that she has been feeling weak, tired since 3 to 4 days.  Reports cough, shortness of breath especially with exertion however denies chest pain, loss of sense of taste or smell, nausea, vomiting, diarrhea, fever, chills, abdominal pain, leg swelling, recent travel, immobilization, urinary or bowel changes.  She thinks that she may have been exposed about 2 weeks ago at her church. She received first dose of COVID-19 vaccine [employer]) on 10/13.  No history of headache, blurry vision, lightheadedness, dizziness, orthopnea, PND, palpitation, sleep changes.  She is a former smoker and quit smoking 12 years ago, denies alcohol, illicit drug use.  ED Course: Upon arrival to ED: Patient's vital signs stable, afebrile, hypoxic requiring 2 L of oxygen via nasal cannula, COVID-19 positive, lactic acid: WNL, CBC shows WBC of 3.5, platelet: 127, CMP shows AKI, sodium of 132, D-dimer: 0.94, inflammatory markers: WNL.  Chest x-ray: Negative for pneumonia.  CTA chest: Ordered and is pending.  Patient was given remdesivir in ED.  [facility] hospitalist consulted for admission due to acute hypoxemic respiratory failure due to COVID-19 infection.  Review of Systems: As per HPI otherwise negative.    Past Medical History:  Diagnosis Date  . Anxiety   . COPD (chronic obstructive pulmonary disease) ([place])   .  Depression   . Hyperlipidemia   . Hypertension   . Multinodular goiter     Past Surgical History:  Procedure Laterality Date  . PARTIAL HYSTERECTOMY  1980s  . TOTAL HIP ARTHROPLASTY Right 05/04/2019   Procedure: RIGHT TOTAL HIP ARTHROPLASTY ANTERIOR APPROACH;  Surgeon: Frederik Pear, MD;  Location: [facility];  Service: Orthopedics;  Laterality: Right;     reports that she quit smoking about 11 years ago. She has never used smokeless tobacco. She reports that she does not drink alcohol and does not use drugs.  No Known Allergies  Family History  Problem Relation Age of Onset  . Schizophrenia Mother   . Dementia Mother     Prior to Admission medications   Medication Sig Start Date End Date Taking? Authorizing Provider  aspirin EC 81 MG tablet Take 1 tablet (81 mg total) by mouth 2 (two) times daily. 05/04/19   Leighton Parody, PA-C  Camphor-Menthol-Methyl Sal [facility] EX) Place 1 patch onto the skin daily as needed (hip pain.).    [provider]  gabapentin (NEURONTIN) 100 MG capsule Take 100 mg by mouth at bedtime. 03/27/19   [provider]  losartan (COZAAR) 100 MG tablet Take 100 mg by mouth at bedtime. 03/18/19   [provider]  naphazoline-glycerin (CLEAR EYES REDNESS) 0.012-0.2 % SOLN Place 1 drop into both eyes 4 (four) times daily as needed for eye irritation.    [provider]  oxyCODONE-acetaminophen (PERCOCET/ROXICET) 5-325 MG tablet Take 1 tablet by mouth every 4 (four) hours as needed for severe pain. 05/04/19   Leighton Parody, PA-C  simvastatin (ZOCOR) 20 MG tablet Take 20 mg  by mouth at bedtime.  04/26/14   [provider]  SYMBICORT 80-4.5 MCG/ACT inhaler Inhale 2 puffs into the lungs in the morning and at bedtime.  05/16/14   [provider]  tiZANidine (ZANAFLEX) 2 MG tablet Take 1 tablet (2 mg total) by mouth every 6 (six) hours as needed. 05/04/19   Leighton Parody, PA-C  VENTOLIN HFA 108 (90 Base) MCG/ACT inhaler  Inhale 1 puff into the lungs every 4 (four) hours as needed for wheezing or shortness of breath. 01/07/19   [provider]    Physical Exam: Vitals:   10/28/19 0945 10/28/19 1000 10/28/19 1015 10/28/19 1030  BP: 116/70 109/72 114/68 112/76  Pulse: 66 67 64 72  Resp: 16 19 17 20   SpO2: 96% 94% 98% 92%  Weight:      Height:        Constitutional: NAD, calm, comfortable, on 2 L of oxygen via nasal cannula, communicating well Eyes: PERRL, lids and conjunctivae normal ENMT: Mucous membranes are moist. Posterior pharynx clear of any exudate or lesions.Normal dentition.  Neck: normal, supple, no masses, no thyromegaly Respiratory: clear to auscultation bilaterally, no wheezing, no crackles. Normal respiratory effort. No accessory muscle use.  Cardiovascular: Regular rate and rhythm, no murmurs / rubs / gallops. No extremity edema. 2+ pedal pulses. No carotid bruits.  Abdomen: no tenderness, no masses palpated. No hepatosplenomegaly. Bowel sounds positive.  Musculoskeletal: no clubbing / cyanosis. No joint deformity upper and lower extremities. Good ROM, no contractures. Normal muscle tone.  Skin: no rashes, lesions, ulcers. No induration Neurologic: CN 2-12 grossly intact. Sensation intact, DTR normal. Strength 5/5 in all 4.  Psychiatric: Normal judgment and insight. Alert and oriented x 3. Normal mood.    Labs on Admission: I have personally reviewed following labs and imaging studies  CBC: Recent Labs  Lab 10/28/19 0811  WBC 3.5*  NEUTROABS 2.4  HGB 14.3  HCT 43.2  MCV 91.3  PLT 024*   Basic Metabolic Panel: Recent Labs  Lab 10/28/19 0811  NA 132*  K 4.5  CL 102  CO2 19*  GLUCOSE 124*  BUN 21  CREATININE 1.29*  CALCIUM 8.7*   GFR: Estimated Creatinine Clearance: 44.9 mL/min (A) (by C-G formula based on SCr of 1.29 mg/dL (H)). Liver Function Tests: Recent Labs  Lab 10/28/19 0811  AST 46*  ALT 23  ALKPHOS 39  BILITOT 0.7  PROT 6.5  ALBUMIN 3.2*    No results for input(s): LIPASE, AMYLASE in the last 168 hours. No results for input(s): AMMONIA in the last 168 hours. Coagulation Profile: No results for input(s): INR, PROTIME in the last 168 hours. Cardiac Enzymes: No results for input(s): CKTOTAL, CKMB, CKMBINDEX, TROPONINI in the last 168 hours. BNP (last 3 results) No results for input(s): PROBNP in the last 8760 hours. HbA1C: No results for input(s): HGBA1C in the last 72 hours. CBG: No results for input(s): GLUCAP in the last 168 hours. Lipid Profile: Recent Labs    10/28/19 0811  TRIG 69   Thyroid Function Tests: No results for input(s): TSH, T4TOTAL, FREET4, T3FREE, THYROIDAB in the last 72 hours. Anemia Panel: Recent Labs    10/28/19 0811  FERRITIN 172   Urine analysis:    Component Value Date/Time   COLORURINE YELLOW 04/27/2019 1331   APPEARANCEUR CLEAR 04/27/2019 1331   LABSPEC 1.020 04/27/2019 1331   PHURINE 5.0 04/27/2019 1331   GLUCOSEU NEGATIVE 04/27/2019 1331   HGBUR NEGATIVE 04/27/2019 1331   BILIRUBINUR NEGATIVE 04/27/2019  [place] 04/27/2019 1331   PROTEINUR NEGATIVE 04/27/2019 1331   UROBILINOGEN 0.2 10/27/2006 1415   NITRITE NEGATIVE 04/27/2019 1331   LEUKOCYTESUR TRACE (A) 04/27/2019 1331    Radiological Exams on Admission: DG Chest Port 1 View  Result Date: 10/28/2019 CLINICAL DATA:  Shortness of breath EXAM: PORTABLE CHEST 1 VIEW COMPARISON:  April 27, 2019 FINDINGS: There is mild bibasilar atelectasis. The lungs elsewhere are clear. Heart is mildly enlarged with pulmonary vascularity normal. No adenopathy. No bone lesions. There is aortic atherosclerosis. IMPRESSION: Mild bibasilar atelectasis. No edema or airspace opacity. Mild cardiomegaly. Aortic Atherosclerosis (ICD10-I70.0). Electronically Signed   By: Lowella Grip III M.D.   On: 10/28/2019 08:52    EKG: Independently reviewed.  Sinus rhythm.  No ST elevation or depression noted.  Assessment/Plan Principal  Problem:   Acute hypoxemic respiratory failure (HCC) Active Problems:   Hyperlipidemia   COPD (chronic obstructive pulmonary disease) (HCC)   Hypertension   AKI (acute kidney injury) ([place])   Leukopenia   Thrombocytopenia (HCC)    Acute hypoxemic respiratory failure secondary to COVID-19 infection: Recently exposed to COVID-19 at church.  Tested positive for COVID-19 yesterday.  Presented with exertional shortness of breath, cough, generalized weakness.  Reviewed chest x-ray.  She is afebrile with WBC of 3.5, lactic acid: WNL.  -Admit patient on the floor.  On continuous pulse ox.  We will try to wean off of oxygen as tolerated.   -Received remdesivir in ED.   -Continue remdesivir as per pharmacy and start patient on Solu-Medrol, p.o. antitussive and p.o. vitamins.  Inflammatory markers: Within normal limits.  D-dimer: 0.94.  EDP ordered CTA chest which is pending. -Repeat inflammatory markers tomorrow AM. -Monitor vitals closely.  Hypertension: Stable -Hold losartan due to AKI.  Hydralazine as needed for blood pressure more than 160/100.  Resume losartan once kidney function is back to baseline  AKI: -Hold nephrotoxic medication.  Repeat BMP tomorrow a.m.  Hyperlipidemia: Continue statin  COPD: No wheezing noted on exam.  Continue Symbicort, albuterol  Leukopenia/thrombocytopenia: -Likely in the setting of COVID-19 infection. -Continue to monitor  DVT prophylaxis: Lovenox/SCD Code Status: Full code Family Communication: None present at bedside.  Plan of care discussed with patient in length and she verbalized understanding and agreed with it. Disposition Plan: Home in 3 to 4 days Consults called: None Admission status: Inpatient    Mckinley Jewel MD [facility]  If 7PM-7AM, please contact night-coverage [email or web address]  10/28/2019, 4:04 PM

## 2019-10-29 DIAGNOSIS — J449 Chronic obstructive pulmonary disease, unspecified: Secondary | ICD-10-CM

## 2019-10-29 DIAGNOSIS — N179 Acute kidney failure, unspecified: Secondary | ICD-10-CM

## 2019-10-29 DIAGNOSIS — I10 Essential (primary) hypertension: Secondary | ICD-10-CM

## 2019-10-29 LAB — CBC WITH DIFFERENTIAL/PLATELET
Abs Immature Granulocytes: 0.01 10*3/uL (ref 0.00–0.07)
Basophils Absolute: 0 10*3/uL (ref 0.0–0.1)
Basophils Relative: 0 %
Eosinophils Absolute: 0 10*3/uL (ref 0.0–0.5)
Eosinophils Relative: 0 %
HCT: 41.6 % (ref 36.0–46.0)
Hemoglobin: 13.9 g/dL (ref 12.0–15.0)
Immature Granulocytes: 0 %
Lymphocytes Relative: 20 %
Lymphs Abs: 0.7 10*3/uL (ref 0.7–4.0)
MCH: 30.1 pg (ref 26.0–34.0)
MCHC: 33.4 g/dL (ref 30.0–36.0)
MCV: 90 fL (ref 80.0–100.0)
Monocytes Absolute: 0.1 10*3/uL (ref 0.1–1.0)
Monocytes Relative: 3 %
Neutro Abs: 2.5 10*3/uL (ref 1.7–7.7)
Neutrophils Relative %: 77 %
Platelets: 145 10*3/uL — ABNORMAL LOW (ref 150–400)
RBC: 4.62 MIL/uL (ref 3.87–5.11)
RDW: 15.8 % — ABNORMAL HIGH (ref 11.5–15.5)
WBC: 3.3 10*3/uL — ABNORMAL LOW (ref 4.0–10.5)
nRBC: 0 % (ref 0.0–0.2)

## 2019-10-29 LAB — MAGNESIUM: Magnesium: 2.1 mg/dL (ref 1.7–2.4)

## 2019-10-29 LAB — COMPREHENSIVE METABOLIC PANEL
ALT: 23 U/L (ref 0–44)
AST: 39 U/L (ref 15–41)
Albumin: 2.8 g/dL — ABNORMAL LOW (ref 3.5–5.0)
Alkaline Phosphatase: 40 U/L (ref 38–126)
Anion gap: 7 (ref 5–15)
BUN: 19 mg/dL (ref 8–23)
CO2: 21 mmol/L — ABNORMAL LOW (ref 22–32)
Calcium: 8.5 mg/dL — ABNORMAL LOW (ref 8.9–10.3)
Chloride: 106 mmol/L (ref 98–111)
Creatinine, Ser: 0.94 mg/dL (ref 0.44–1.00)
GFR, Estimated: >60 mL/min (ref >60)
Glucose, Bld: 135 mg/dL — ABNORMAL HIGH (ref 70–99)
Potassium: 4.6 mmol/L (ref 3.5–5.1)
Sodium: 134 mmol/L — ABNORMAL LOW (ref 135–145)
Total Bilirubin: 0.4 mg/dL (ref 0.3–1.2)
Total Protein: 6.2 g/dL — ABNORMAL LOW (ref 6.5–8.1)

## 2019-10-29 LAB — D-DIMER, QUANTITATIVE: D-Dimer, Quant: 0.98 ug/mL-FEU — ABNORMAL HIGH (ref 0.00–0.50)

## 2019-10-29 LAB — C-REACTIVE PROTEIN: CRP: 0.7 mg/dL (ref <1.0)

## 2019-10-29 LAB — FERRITIN: Ferritin: 222 ng/mL (ref 11–307)

## 2019-10-29 LAB — PHOSPHORUS: Phosphorus: 4 mg/dL (ref 2.5–4.6)

## 2019-10-29 NOTE — TOC Initial Note (Signed)
Transition of Care [facility]) - Initial/Assessment Note    Patient Details  Name: Pamela Roth MRN: [id number] Date of Birth: 09-19-1950  [facility]) CM/SW Contact:    Verdell Carmine, RN Phone Number: 10/29/2019, 3:37 PM  Clinical Narrative:                 In with COVID pneumonia.  EDD >3 days.  History of COPD. Had first dose of vaccination.  On Steroids and remdivisir IV. Has support from family. Called room, no answer. May need oxygen at home and home health post discharge.  CM will follow for needs  Expected Discharge Plan: Home/Self Care Barriers to Discharge: Continued Medical Work up   Patient Goals and CMS Choice        Expected Discharge Plan and Services Expected Discharge Plan: Home/Self Care   Discharge Planning Services: CM Consult   Living arrangements for the past 2 months: Apartment                                      Prior Living Arrangements/Services Living arrangements for the past 2 months: Apartment Lives with:: Self Patient language and need for interpreter reviewed:: Yes        Need for Family Participation in Patient Care: Yes (Comment) Care giver support system in place?: Yes (comment)   Criminal Activity/Legal Involvement Pertinent to Current Situation/Hospitalization: No - Comment as needed  Activities of Daily Living Home Assistive Devices/Equipment: None ADL Screening (condition at time of admission) Patient's cognitive ability adequate to safely complete daily activities?: Yes Is the patient deaf or have difficulty hearing?: No Does the patient have difficulty seeing, even when wearing glasses/contacts?: No Does the patient have difficulty concentrating, remembering, or making decisions?: No Patient able to express need for assistance with ADLs?: Yes Does the patient have difficulty dressing or bathing?: No Independently performs ADLs?: Yes (appropriate for developmental age) Does the patient have  difficulty walking or climbing stairs?: No Weakness of Legs: Both Weakness of Arms/Hands: None  Permission Sought/Granted                  Emotional Assessment       Orientation: : Oriented to Self, Oriented to Place, Oriented to  Time, Oriented to Situation Alcohol / Substance Use: Not Applicable Psych Involvement: No (comment)  Admission diagnosis:  Acute respiratory failure with hypoxia ([place]) [J96.01] Acute hypoxemic respiratory failure ([place]) [J96.01] COVID-19 [U07.1] Patient Active Problem List   Diagnosis Date Noted  . Hypertension   . Acute hypoxemic respiratory failure ([place])   . AKI (acute kidney injury) ([place])   . Leukopenia   . Thrombocytopenia ([place])   . Allergic rhinitis 05/05/2019  . COPD (chronic obstructive pulmonary disease) ([place]) 05/05/2019  . 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 Osteoarthritis of right hip 05/01/2019  . 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 CHRONIC OBSTRUCTIVE PULMONARY DISEASE, ACUTE EXACERBATION 03/06/2007  . BRONCHITIS 11/08/2006  . REACTIVE AIRWAY DISEASE 11/08/2006  . Hyperlipidemia 11/07/2006  . PERIODONTITIS, ACUTE 11/07/2006  . POSITIVE PPD 11/07/2006   PCP:  Kelton Pillar, MD Pharmacy:   [id number] [place], [place] [address] Phone: [phone] Fax: [phone]     Social Determinants of Health (SDOH) Interventions    Readmission Risk Interventions No flowsheet data found.

## 2019-10-29 NOTE — Progress Notes (Signed)
PROGRESS NOTE                                                                                                                                                                                                             Patient Demographics:    Pamela Roth, is a 69 y.o. female, DOB - Oct 17, 1950, SWH:[id number]  Outpatient Primary MD for the patient is Kelton Pillar, MD   Admit date - 10/28/2019   LOS - 1  No chief complaint on file.      Brief Narrative: Patient is a 69 y.o. female with PMHx of COPD COPD, HTN, HLD who tested positive for COVID-19 on 10/19-presented to the hospital with 5-day history of weakness, exertional dyspnea-found to have acute hypoxic respiratory failure and admitted to the hospitalist service.  See below for further details  COVID-19 vaccinated status: First dose of Pfizer on 10/13  Significant Events: 10/21>> Admit to [facility] for hypoxia in the setting of COVID-19 infection  Significant studies: 10/20>>Chest x-ray: Mild bibasilar atelectasis 10/20>> CTA chest: No PE, interstitial peripheral groundglass opacities  COVID-19 medications: Steroids: 10/20>> Remdesivir: 10/20>>  Antibiotics: None  Microbiology data: 10/20 >>blood culture: No growth  Procedures: None  Consults: None  DVT prophylaxis: enoxaparin (LOVENOX) injection 40 mg Start: 10/28/19 1615 SCDs Start: 10/28/19 1603    Subjective:    [name] today feels somewhat better today-less shortness of breath with exertion-was on 4 L of oxygen but was titrated down to 2 L.   Assessment  & Plan :   Acute Hypoxic Resp Failure due to Covid 19 Viral pneumonia: Improving-less shortness of breath with exertion today-Down to 2 L of oxygen.  Continue steroids/Remdesivir.  She is very early into her course with COVID-19-and needs to remain hospitalized for further monitoring.  Fever: afebrile O2 requirements:  SpO2: (!)  88 % O2 Flow Rate (L/min): 4 L/min   COVID-19 Labs: Recent Labs    10/28/19 0811 10/29/19 0358  DDIMER 0.94* 0.98*  FERRITIN 172 222  LDH 316*  --   CRP 0.5 0.7       Component Value Date/Time   BNP 37.3 10/28/2019 1850    Recent Labs  Lab 10/28/19 0811  PROCALCITON 0.19    Lab Results  Component Value Date   SARSCOV2NAA POSITIVE (A) 10/28/2019   [place] NEGATIVE 05/01/2019  Prone/Incentive Spirometry: encouraged incentive spirometry use 3-4/hour.  Mild leukopenia/thrombocytopenia: Secondary to COVID-19-should improve with supportive care.  Follow periodically.  AKI: Mild-likely hemodynamically mediated-resolved with supportive care.  HTN: BP stable-losartan on hold-continue to monitor off antihypertensives for now  HLD: Continue statin  COPD: Not in exacerbation-continue bronchodilators  2.1 cm left thyroid nodule: Stable for outpatient monitoring   ABG:    Component Value Date/Time   PHART 7.387 10/28/2006 0541   PCO2ART 44.2 10/28/2006 0541   PO2ART 97.7 10/28/2006 0541   HCO3 26.2 (H) 10/28/2006 0541   TCO2 23.5 10/28/2006 0541   ACIDBASEDEF 4.7 (H) 10/27/2006 2033   O2SAT 98.1 10/28/2006 0541    Vent Settings: N/A    Condition -Guarded  Family Communication  : Daughter Lanisha Stepanian [phone] -voicemail left on 10/21  Code Status :  Full Code  Diet :  Diet Order            Diet 2 gram sodium Room service appropriate? Yes; Fluid consistency: Thin  Diet effective now                  Disposition Plan  :   Status is: Inpatient  Remains inpatient appropriate because:Inpatient level of care appropriate due to severity of illness   Dispo: The patient is from: Home              Anticipated d/c is to: Home              Anticipated d/c date is: 3 days              Patient currently is not medically stable to d/c.   Barriers to discharge: Hypoxia requiring O2 supplementation/complete 5 days of IV  Remdesivir  Antimicorbials  :    Anti-infectives (From admission, onward)   Start     Dose/Rate Route Frequency Ordered Stop   10/29/19 1000  remdesivir 100 mg in sodium chloride 0.9 % 100 mL IVPB       "Followed by" Linked Group Details   100 mg 200 mL/hr over 30 Minutes Intravenous Every 24 hours 10/28/19 1111 11/02/19 0959   10/29/19 1000  remdesivir 100 mg in sodium chloride 0.9 % 100 mL IVPB  Status:  Discontinued       "Followed by" Linked Group Details   100 mg 200 mL/hr over 30 Minutes Intravenous Daily 10/28/19 1604 10/28/19 1616   10/28/19 1615  remdesivir 200 mg in sodium chloride 0.9% 250 mL IVPB  Status:  Discontinued       "Followed by" Linked Group Details   200 mg 580 mL/hr over 30 Minutes Intravenous Once 10/28/19 1604 10/28/19 1616   10/28/19 1200  remdesivir 200 mg in sodium chloride 0.9% 250 mL IVPB       "Followed by" Linked Group Details   200 mg 580 mL/hr over 30 Minutes Intravenous Once 10/28/19 1111 10/28/19 1301      Inpatient Medications  Scheduled Meds: . vitamin C  500 mg Oral Daily  . enoxaparin (LOVENOX) injection  40 mg Subcutaneous Q24H  . methylPREDNISolone (SOLU-MEDROL) injection  1 mg/kg Intravenous Q12H   Followed by  . [START ON 10/31/2019] predniSONE  50 mg Oral Daily  . zinc sulfate  220 mg Oral Daily   Continuous Infusions: . remdesivir 100 mg in NS 100 mL     PRN Meds:.acetaminophen, chlorpheniramine-HYDROcodone, guaiFENesin-dextromethorphan, ondansetron **OR** ondansetron (ZOFRAN) IV   Time Spent in minutes  25  See all Orders from today for further details  Oren Binet M.D on 10/29/2019 at 10:41 AM  To page go to [email or web address] - use universal password  [facility] -  Office  [phone]    Objective:   Vitals:   10/28/19 2106 10/28/19 2333 10/29/19 0432 10/29/19 0800  BP: 127/87 110/68 104/68 102/67  Pulse: 71 64 61 61  Resp: 20 20 17 17   Temp: (!) 100.4 F (38 C) 100.2 F (37.9 C) 99.5 F (37.5 C)  98.1 F (36.7 C)  TempSrc: Oral Axillary Axillary Oral  SpO2: 90% 95% 91% (!) 88%  Weight:      Height:        Wt Readings from Last 3 Encounters:  10/28/19 79.4 kg  05/06/19 76.3 kg  04/27/19 76.3 kg     Intake/Output Summary (Last 24 hours) at 10/29/2019 1041 Last data filed at 10/29/2019 0900 Gross per 24 hour  Intake 1483.33 ml  Output --  Net 1483.33 ml     Physical Exam Gen Exam:Alert awake-not in any distress HEENT:atraumatic, normocephalic Chest: B/L clear to auscultation anteriorly CVS:S1S2 regular Abdomen:soft non tender, non distended Extremities:no edema Neurology: Non focal Skin: no rash   Data Review:    CBC Recent Labs  Lab 10/28/19 0811 10/29/19 0358  WBC 3.5* 3.3*  HGB 14.3 13.9  HCT 43.2 41.6  PLT 127* 145*  MCV 91.3 90.0  MCH 30.2 30.1  MCHC 33.1 33.4  RDW 15.8* 15.8*  LYMPHSABS 0.9 0.7  MONOABS 0.1 0.1  EOSABS 0.0 0.0  BASOSABS 0.0 0.0    Chemistries  Recent Labs  Lab 10/28/19 0811 10/29/19 0358  NA 132* 134*  K 4.5 4.6  CL 102 106  CO2 19* 21*  GLUCOSE 124* 135*  BUN 21 19  CREATININE 1.29* 0.94  CALCIUM 8.7* 8.5*  MG  --  2.1  AST 46* 39  ALT 23 23  ALKPHOS 39 40  BILITOT 0.7 0.4   ------------------------------------------------------------------------------------------------------------------ Recent Labs    10/28/19 0811  TRIG 69    No results found for: HGBA1C ------------------------------------------------------------------------------------------------------------------ No results for input(s): TSH, T4TOTAL, T3FREE, THYROIDAB in the last 72 hours.  Invalid input(s): FREET3 ------------------------------------------------------------------------------------------------------------------ Recent Labs    10/28/19 0811 10/29/19 0358  FERRITIN 172 222    Coagulation profile No results for input(s): INR, PROTIME in the last 168 hours.  Recent Labs    10/28/19 0811 10/29/19 0358  DDIMER 0.94*  0.98*    Cardiac Enzymes No results for input(s): CKMB, TROPONINI, MYOGLOBIN in the last 168 hours.  Invalid input(s): CK ------------------------------------------------------------------------------------------------------------------    Component Value Date/Time   BNP 37.3 10/28/2019 1850    Micro Results Recent Results (from the past 240 hour(s))  Respiratory Panel by RT PCR (Flu A&B, Covid) - Nasopharyngeal Swab     Status: Abnormal   Collection Time: 10/28/19  8:11 AM   Specimen: Nasopharyngeal Swab  Result Value Ref Range Status   SARS Coronavirus 2 by RT PCR POSITIVE (A) NEGATIVE Final    Comment: emailed L. Berdik RN 10:25 10/28/19 ([email or web address]) (NOTE) SARS-CoV-2 target nucleic acids are DETECTED.  SARS-CoV-2 RNA is generally detectable in upper respiratory specimens  during the acute phase of infection. Positive results are indicative of the presence of the identified virus, but do not rule out bacterial infection or co-infection with other pathogens not detected by the test. Clinical correlation with patient history and other diagnostic information is necessary to determine patient infection status. The expected result is Negative.  Fact Sheet for Patients:  PinkCheek.be  Fact Sheet for  Healthcare Providers: GravelBags.it  This test is not yet approved or cleared by the [place] and  has been authorized for detection and/or diagnosis of SARS-CoV-2 by FDA under an Emergency Use Authorization (EUA).  This EUA will remain in effect (meaning this test can be used) for the duration of  the COVID-19  declaration under Section 564(b)(1) of the Act, 21 U.S.C. section 360bbb-3(b)(1), unless the authorization is terminated or revoked sooner.      Influenza A by PCR NEGATIVE NEGATIVE Final   Influenza B by PCR NEGATIVE NEGATIVE Final    Comment: (NOTE) The Xpert Xpress SARS-CoV-2/FLU/RSV assay is intended  as an aid in  the diagnosis of influenza from Nasopharyngeal swab specimens and  should not be used as a sole basis for treatment. Nasal washings and  aspirates are unacceptable for Xpert Xpress SARS-CoV-2/FLU/RSV  testing.  Fact Sheet for Patients: PinkCheek.be  Fact Sheet for Healthcare Providers: GravelBags.it  This test is not yet approved or cleared by the [place] FDA and  has been authorized for detection and/or diagnosis of SARS-CoV-2 by  FDA under an Emergency Use Authorization (EUA). This EUA will remain  in effect (meaning this test can be used) for the duration of the  Covid-19 declaration under Section 564(b)(1) of the Act, 21  U.S.C. section 360bbb-3(b)(1), unless the authorization is  terminated or revoked. Performed at [facility], [place] [address], [place], [place] [address]   Blood Culture (routine x 2)     Status: None (Preliminary result)   Collection Time: 10/28/19  8:23 AM   Specimen: BLOOD  Result Value Ref Range Status   Specimen Description BLOOD RIGHT ANTECUBITAL  Final   Special Requests   Final    BOTTLES DRAWN AEROBIC AND ANAEROBIC Blood Culture results may not be optimal due to an inadequate volume of blood received in culture bottles   Culture   Final    NO GROWTH <12 HOURS Performed at [facility], [place] [address], [place], [place] [address]    Report Status PENDING  Incomplete  Blood Culture (routine x 2)     Status: None (Preliminary result)   Collection Time: 10/28/19  8:30 AM   Specimen: BLOOD  Result Value Ref Range Status   Specimen Description BLOOD LEFT ANTECUBITAL  Final   Special Requests   Final    BOTTLES DRAWN AEROBIC AND ANAEROBIC Blood Culture results may not be optimal due to an inadequate volume of blood received in culture bottles   Culture   Final    NO GROWTH <12 HOURS Performed at [facility], [place] [address] [place], [place] [address]     Report Status PENDING  Incomplete    Radiology Reports CT Angio Chest PE W and/or Wo Contrast  Result Date: 10/28/2019 CLINICAL DATA:  Weakness, positive COVID-19 EXAM: CT ANGIOGRAPHY CHEST WITH CONTRAST TECHNIQUE: Multidetector CT imaging of the chest was performed using the standard protocol during bolus administration of intravenous contrast. Multiplanar CT image reconstructions and MIPs were obtained to evaluate the vascular anatomy. CONTRAST:  16mL OMNIPAQUE IOHEXOL 350 MG/ML SOLN COMPARISON:  Chest x-ray today. Previous thyroid biopsy report 06/09/2014. FINDINGS: Cardiovascular: No filling defects in the pulmonary arteries to suggest pulmonary emboli. Aortic atherosclerosis and coronary artery calcifications. Heart is normal size. Aorta is normal caliber. Mediastinum/Nodes: No mediastinal, hilar, or axillary adenopathy. Trachea and esophagus are unremarkable. Left thyroid nodule measuring up to 2.1 cm. Lungs/Pleura: Emphysema. Interstitial thickening peripherally in the lungs  most compatible with fibrosis. Dependent and bibasilar atelectasis. No effusions. Upper Abdomen: Imaging into the upper abdomen demonstrates no acute findings. Musculoskeletal: Chest wall soft tissues are unremarkable. No acute bony abnormality. Review of the MIP images confirms the above findings. IMPRESSION: No evidence of pulmonary embolus. Interstitial thickening peripheral oil with ground-glass opacities most compatible with chronic lung disease/fibrosis. Dependent and bibasilar atelectasis. 2.1 cm left thyroid nodule. This has been evaluated on previous imaging. (ref: J Am Coll Radiol. 2015 Feb;12(2): 143-50).Recommend correlation with previous thyroid biopsy. Aortic Atherosclerosis (ICD10-I70.0). Scattered coronary artery disease. Electronically Signed   By: Rolm Baptise M.D.   On: 10/28/2019 19:47   DG Chest Port 1 View  Result Date: 10/28/2019 CLINICAL DATA:  Shortness of breath EXAM: PORTABLE CHEST 1 VIEW COMPARISON:   April 27, 2019 FINDINGS: There is mild bibasilar atelectasis. The lungs elsewhere are clear. Heart is mildly enlarged with pulmonary vascularity normal. No adenopathy. No bone lesions. There is aortic atherosclerosis. IMPRESSION: Mild bibasilar atelectasis. No edema or airspace opacity. Mild cardiomegaly. Aortic Atherosclerosis (ICD10-I70.0). Electronically Signed   By: Lowella Grip III M.D.   On: 10/28/2019 08:52

## 2019-10-30 LAB — CBC WITH DIFFERENTIAL/PLATELET
Abs Immature Granulocytes: 0 10*3/uL (ref 0.00–0.07)
Basophils Absolute: 0 10*3/uL (ref 0.0–0.1)
Basophils Relative: 0 %
Eosinophils Absolute: 0 10*3/uL (ref 0.0–0.5)
Eosinophils Relative: 0 %
HCT: 41.4 % (ref 36.0–46.0)
Hemoglobin: 14.1 g/dL (ref 12.0–15.0)
Lymphocytes Relative: 10 %
Lymphs Abs: 0.7 10*3/uL (ref 0.7–4.0)
MCH: 30.7 pg (ref 26.0–34.0)
MCHC: 34.1 g/dL (ref 30.0–36.0)
MCV: 90 fL (ref 80.0–100.0)
Monocytes Absolute: 0.1 10*3/uL (ref 0.1–1.0)
Monocytes Relative: 1 %
Neutro Abs: 6 10*3/uL (ref 1.7–7.7)
Neutrophils Relative %: 89 %
Platelets: 172 10*3/uL (ref 150–400)
RBC: 4.6 MIL/uL (ref 3.87–5.11)
RDW: 15.8 % — ABNORMAL HIGH (ref 11.5–15.5)
WBC: 6.7 10*3/uL (ref 4.0–10.5)
nRBC: 0 % (ref 0.0–0.2)
nRBC: 0 /100 WBC

## 2019-10-30 LAB — COMPREHENSIVE METABOLIC PANEL
ALT: 25 U/L (ref 0–44)
AST: 39 U/L (ref 15–41)
Albumin: 2.8 g/dL — ABNORMAL LOW (ref 3.5–5.0)
Alkaline Phosphatase: 42 U/L (ref 38–126)
Anion gap: 8 (ref 5–15)
BUN: 25 mg/dL — ABNORMAL HIGH (ref 8–23)
CO2: 23 mmol/L (ref 22–32)
Calcium: 9 mg/dL (ref 8.9–10.3)
Chloride: 107 mmol/L (ref 98–111)
Creatinine, Ser: 1.01 mg/dL — ABNORMAL HIGH (ref 0.44–1.00)
GFR, Estimated: 60 mL/min — ABNORMAL LOW (ref >60)
Glucose, Bld: 146 mg/dL — ABNORMAL HIGH (ref 70–99)
Potassium: 4.4 mmol/L (ref 3.5–5.1)
Sodium: 138 mmol/L (ref 135–145)
Total Bilirubin: 0.5 mg/dL (ref 0.3–1.2)
Total Protein: 6 g/dL — ABNORMAL LOW (ref 6.5–8.1)

## 2019-10-30 LAB — D-DIMER, QUANTITATIVE: D-Dimer, Quant: 0.8 ug/mL-FEU — ABNORMAL HIGH (ref 0.00–0.50)

## 2019-10-30 LAB — FERRITIN: Ferritin: 400 ng/mL — ABNORMAL HIGH (ref 11–307)

## 2019-10-30 LAB — C-REACTIVE PROTEIN: CRP: 0.7 mg/dL (ref <1.0)

## 2019-10-30 NOTE — Progress Notes (Signed)
PROGRESS NOTE                                                                                                                                                                                                             Patient Demographics:    Pamela Roth, is a 69 y.o. female, DOB - [birth year], EXB:[id number]  Outpatient Primary MD for the patient is Kelton Pillar, MD   Admit date - 10/28/2019   LOS - 2  No chief complaint on file.      Brief Narrative: Patient is a 69 y.o. female with PMHx of COPD COPD, HTN, HLD who tested positive for COVID-19 on 10/19-presented to the hospital with 5-day history of weakness, exertional dyspnea-found to have acute hypoxic respiratory failure and admitted to the hospitalist service.  See below for further details  COVID-19 vaccinated status: First dose of Pfizer on 10/13  Significant Events: 10/21>> Admit to [facility] for hypoxia in the setting of COVID-19 infection  Significant studies: 10/20>>Chest x-ray: Mild bibasilar atelectasis 10/20>> CTA chest: No PE, interstitial peripheral groundglass opacities  COVID-19 medications: Steroids: 10/20>> Remdesivir: 10/20>>  Antibiotics: None  Microbiology data: 10/20 >>blood culture: No growth  Procedures: None  Consults: None  DVT prophylaxis: enoxaparin (LOVENOX) injection 40 mg Start: 10/28/19 1615 SCDs Start: 10/28/19 1603    Subjective:   Claims she feels better than yesterday.  Down to 1-1/2-2 L of oxygen this morning.   Assessment  & Plan :   Acute Hypoxic Resp Failure due to Covid 19 Viral pneumonia: Slowly improving-down to 1.5-2 L of oxygen-remains on steroid/Remdesivir.  Continued attempts to slowly titrate down FiO2.  Fever: afebrile O2 requirements:  SpO2: (!) 87 % O2 Flow Rate (L/min): 4 L/min   COVID-19 Labs: Recent Labs    10/28/19 0811 10/29/19 0358 10/30/19 0319  DDIMER 0.94* 0.98* 0.80*    FERRITIN 172 222 400*  LDH 316*  --   --   CRP 0.5 0.7 0.7       Component Value Date/Time   BNP 37.3 10/28/2019 1850    Recent Labs  Lab 10/28/19 0811  PROCALCITON 0.19    Lab Results  Component Value Date   SARSCOV2NAA POSITIVE (A) 10/28/2019   [place] NEGATIVE 05/01/2019     Prone/Incentive Spirometry: encouraged incentive spirometry use 3-4/hour.  Mild leukopenia/thrombocytopenia: Secondary to COVID-19-resolved.  AKI:  Mild-likely hemodynamically mediated-resolved with supportive care.  HTN: BP stable-losartan on hold-continue to monitor off antihypertensives for now  HLD: Continue statin  COPD: Not in exacerbation-continue bronchodilators  2.1 cm left thyroid nodule: Stable for outpatient monitoring   ABG:    Component Value Date/Time   PHART 7.387 10/28/2006 0541   PCO2ART 44.2 10/28/2006 0541   PO2ART 97.7 10/28/2006 0541   HCO3 26.2 (H) 10/28/2006 0541   TCO2 23.5 10/28/2006 0541   ACIDBASEDEF 4.7 (H) 10/27/2006 2033   O2SAT 98.1 10/28/2006 0541    Vent Settings: N/A    Condition -Guarded  Family Communication  : Daughter [name] [phone] -voicemail left on 10/22  Code Status :  Full Code  Diet :  Diet Order            Diet 2 gram sodium Room service appropriate? Yes; Fluid consistency: Thin  Diet effective now                  Disposition Plan  :   Status is: Inpatient  Remains inpatient appropriate because:Inpatient level of care appropriate due to severity of illness   Dispo: The patient is from: Home              Anticipated d/c is to: Home              Anticipated d/c date is: 3 days              Patient currently is not medically stable to d/c.   Barriers to discharge: Hypoxia requiring O2 supplementation/complete 5 days of IV Remdesivir  Antimicorbials  :    Anti-infectives (From admission, onward)   Start     Dose/Rate Route Frequency Ordered Stop   10/29/19 1000  remdesivir 100 mg in sodium chloride  0.9 % 100 mL IVPB       "Followed by" Linked Group Details   100 mg 200 mL/hr over 30 Minutes Intravenous Every 24 hours 10/28/19 1111 11/02/19 0959   10/29/19 1000  remdesivir 100 mg in sodium chloride 0.9 % 100 mL IVPB  Status:  Discontinued       "Followed by" Linked Group Details   100 mg 200 mL/hr over 30 Minutes Intravenous Daily 10/28/19 1604 10/28/19 1616   10/28/19 1615  remdesivir 200 mg in sodium chloride 0.9% 250 mL IVPB  Status:  Discontinued       "Followed by" Linked Group Details   200 mg 580 mL/hr over 30 Minutes Intravenous Once 10/28/19 1604 10/28/19 1616   10/28/19 1200  remdesivir 200 mg in sodium chloride 0.9% 250 mL IVPB       "Followed by" Linked Group Details   200 mg 580 mL/hr over 30 Minutes Intravenous Once 10/28/19 1111 10/28/19 1301      Inpatient Medications  Scheduled Meds: . vitamin C  500 mg Oral Daily  . enoxaparin (LOVENOX) injection  40 mg Subcutaneous Q24H  . methylPREDNISolone (SOLU-MEDROL) injection  1 mg/kg Intravenous Q12H   Followed by  . [START ON 10/31/2019] predniSONE  50 mg Oral Daily  . zinc sulfate  220 mg Oral Daily   Continuous Infusions: . remdesivir 100 mg in NS 100 mL 100 mg (10/30/19 0912)   PRN Meds:.acetaminophen, chlorpheniramine-HYDROcodone, guaiFENesin-dextromethorphan, ondansetron **OR** ondansetron (ZOFRAN) IV   Time Spent in minutes  25  See all Orders from today for further details   Oren Binet M.D on 10/30/2019 at 1:47 PM  To page go to [email or web address] - use universal password  [facility] -  Office  [phone]    Objective:   Vitals:   10/29/19 1946 10/29/19 2357 10/30/19 0350 10/30/19 0743  BP: 117/82 106/68 113/70 126/82  Pulse: 62 60 60 81  Resp: 19 17 18 18   Temp: 98.1 F (36.7 C) 97.6 F (36.4 C) (!) 97.5 F (36.4 C) (!) 97.5 F (36.4 C)  TempSrc:  Oral Oral Oral  SpO2: 90% 94% 92% (!) 87%  Weight:      Height:        Wt Readings from Last 3 Encounters:  10/28/19 79.4 kg   05/06/19 76.3 kg  04/27/19 76.3 kg    No intake or output data in the 24 hours ending 10/30/19 1347   Physical Exam Gen Exam:Alert awake-not in any distress HEENT:atraumatic, normocephalic Chest: B/L clear to auscultation anteriorly CVS:S1S2 regular Abdomen:soft non tender, non distended Extremities:no edema Neurology: Non focal Skin: no rash   Data Review:    CBC Recent Labs  Lab 10/28/19 0811 10/29/19 0358 10/30/19 0319  WBC 3.5* 3.3* 6.7  HGB 14.3 13.9 14.1  HCT 43.2 41.6 41.4  PLT 127* 145* 172  MCV 91.3 90.0 90.0  MCH 30.2 30.1 30.7  MCHC 33.1 33.4 34.1  RDW 15.8* 15.8* 15.8*  LYMPHSABS 0.9 0.7 0.7  MONOABS 0.1 0.1 0.1  EOSABS 0.0 0.0 0.0  BASOSABS 0.0 0.0 0.0    Chemistries  Recent Labs  Lab 10/28/19 0811 10/29/19 0358 10/30/19 0319  NA 132* 134* 138  K 4.5 4.6 4.4  CL 102 106 107  CO2 19* 21* 23  GLUCOSE 124* 135* 146*  BUN 21 19 25*  CREATININE 1.29* 0.94 1.01*  CALCIUM 8.7* 8.5* 9.0  MG  --  2.1  --   AST 46* 39 39  ALT 23 23 25   ALKPHOS 39 40 42  BILITOT 0.7 0.4 0.5   ------------------------------------------------------------------------------------------------------------------ Recent Labs    10/28/19 0811  TRIG 69    No results found for: HGBA1C ------------------------------------------------------------------------------------------------------------------ No results for input(s): TSH, T4TOTAL, T3FREE, THYROIDAB in the last 72 hours.  Invalid input(s): FREET3 ------------------------------------------------------------------------------------------------------------------ Recent Labs    10/29/19 0358 10/30/19 0319  FERRITIN 222 400*    Coagulation profile No results for input(s): INR, PROTIME in the last 168 hours.  Recent Labs    10/29/19 0358 10/30/19 0319  DDIMER 0.98* 0.80*    Cardiac Enzymes No results for input(s): CKMB, TROPONINI, MYOGLOBIN in the last 168 hours.  Invalid input(s):  CK ------------------------------------------------------------------------------------------------------------------    Component Value Date/Time   BNP 37.3 10/28/2019 1850    Micro Results Recent Results (from the past 240 hour(s))  Respiratory Panel by RT PCR (Flu A&B, Covid) - Nasopharyngeal Swab     Status: Abnormal   Collection Time: 10/28/19  8:11 AM   Specimen: Nasopharyngeal Swab  Result Value Ref Range Status   SARS Coronavirus 2 by RT PCR POSITIVE (A) NEGATIVE Final    Comment: emailed L. Berdik RN 10:25 10/28/19 ([email or web address]) (NOTE) SARS-CoV-2 target nucleic acids are DETECTED.  SARS-CoV-2 RNA is generally detectable in upper respiratory specimens  during the acute phase of infection. Positive results are indicative of the presence of the identified virus, but do not rule out bacterial infection or co-infection with other pathogens not detected by the test. Clinical correlation with patient history and other diagnostic information is necessary to determine patient infection status. The expected result is Negative.  Fact Sheet for Patients:  PinkCheek.be  Fact Sheet for Healthcare Providers: GravelBags.it  This test is  not yet approved or cleared by the [place] and  has been authorized for detection and/or diagnosis of SARS-CoV-2 by FDA under an Emergency Use Authorization (EUA).  This EUA will remain in effect (meaning this test can be used) for the duration of  the COVID-19  declaration under Section 564(b)(1) of the Act, 21 U.S.C. section 360bbb-3(b)(1), unless the authorization is terminated or revoked sooner.      Influenza A by PCR NEGATIVE NEGATIVE Final   Influenza B by PCR NEGATIVE NEGATIVE Final    Comment: (NOTE) The Xpert Xpress SARS-CoV-2/FLU/RSV assay is intended as an aid in  the diagnosis of influenza from Nasopharyngeal swab specimens and  should not be used as a sole basis for  treatment. Nasal washings and  aspirates are unacceptable for Xpert Xpress SARS-CoV-2/FLU/RSV  testing.  Fact Sheet for Patients: PinkCheek.be  Fact Sheet for Healthcare Providers: GravelBags.it  This test is not yet approved or cleared by the [place] FDA and  has been authorized for detection and/or diagnosis of SARS-CoV-2 by  FDA under an Emergency Use Authorization (EUA). This EUA will remain  in effect (meaning this test can be used) for the duration of the  Covid-19 declaration under Section 564(b)(1) of the Act, 21  U.S.C. section 360bbb-3(b)(1), unless the authorization is  terminated or revoked. Performed at [facility], [place] [address], [place], [place] [address]   Blood Culture (routine x 2)     Status: None (Preliminary result)   Collection Time: 10/28/19  8:23 AM   Specimen: BLOOD  Result Value Ref Range Status   Specimen Description BLOOD RIGHT ANTECUBITAL  Final   Special Requests   Final    BOTTLES DRAWN AEROBIC AND ANAEROBIC Blood Culture results may not be optimal due to an inadequate volume of blood received in culture bottles   Culture   Final    NO GROWTH 2 DAYS Performed at [facility], [place] [address] [place], [place] [address]    Report Status PENDING  Incomplete  Blood Culture (routine x 2)     Status: None (Preliminary result)   Collection Time: 10/28/19  8:30 AM   Specimen: BLOOD  Result Value Ref Range Status   Specimen Description BLOOD LEFT ANTECUBITAL  Final   Special Requests   Final    BOTTLES DRAWN AEROBIC AND ANAEROBIC Blood Culture results may not be optimal due to an inadequate volume of blood received in culture bottles   Culture   Final    NO GROWTH 2 DAYS Performed at [facility], [place] [address] [place], [place] [address]    Report Status PENDING  Incomplete    Radiology Reports CT Angio Chest PE W and/or Wo Contrast  Result Date:  10/28/2019 CLINICAL DATA:  Weakness, positive COVID-19 EXAM: CT ANGIOGRAPHY CHEST WITH CONTRAST TECHNIQUE: Multidetector CT imaging of the chest was performed using the standard protocol during bolus administration of intravenous contrast. Multiplanar CT image reconstructions and MIPs were obtained to evaluate the vascular anatomy. CONTRAST:  82mL OMNIPAQUE IOHEXOL 350 MG/ML SOLN COMPARISON:  Chest x-ray today. Previous thyroid biopsy report 06/09/2014. FINDINGS: Cardiovascular: No filling defects in the pulmonary arteries to suggest pulmonary emboli. Aortic atherosclerosis and coronary artery calcifications. Heart is normal size. Aorta is normal caliber. Mediastinum/Nodes: No mediastinal, hilar, or axillary adenopathy. Trachea and esophagus are unremarkable. Left thyroid nodule measuring up to 2.1 cm. Lungs/Pleura: Emphysema. Interstitial thickening peripherally in the lungs most compatible with fibrosis. Dependent and bibasilar  atelectasis. No effusions. Upper Abdomen: Imaging into the upper abdomen demonstrates no acute findings. Musculoskeletal: Chest wall soft tissues are unremarkable. No acute bony abnormality. Review of the MIP images confirms the above findings. IMPRESSION: No evidence of pulmonary embolus. Interstitial thickening peripheral oil with ground-glass opacities most compatible with chronic lung disease/fibrosis. Dependent and bibasilar atelectasis. 2.1 cm left thyroid nodule. This has been evaluated on previous imaging. (ref: J Am Coll Radiol. 2015 Feb;12(2): 143-50).Recommend correlation with previous thyroid biopsy. Aortic Atherosclerosis (ICD10-I70.0). Scattered coronary artery disease. Electronically Signed   By: Rolm Baptise M.D.   On: 10/28/2019 19:47   DG Chest Port 1 View  Result Date: 10/28/2019 CLINICAL DATA:  Shortness of breath EXAM: PORTABLE CHEST 1 VIEW COMPARISON:  April 27, 2019 FINDINGS: There is mild bibasilar atelectasis. The lungs elsewhere are clear. Heart is mildly  enlarged with pulmonary vascularity normal. No adenopathy. No bone lesions. There is aortic atherosclerosis. IMPRESSION: Mild bibasilar atelectasis. No edema or airspace opacity. Mild cardiomegaly. Aortic Atherosclerosis (ICD10-I70.0). Electronically Signed   By: Lowella Grip III M.D.   On: 10/28/2019 08:52

## 2019-10-30 NOTE — Evaluation (Signed)
Physical Therapy Evaluation Patient Details Name: Pamela Roth MRN: [id number] DOB: March 04, 1950 Today's Date: 10/30/2019   History of Present Illness  69 y.o. female with PMHx of COPD COPD, HTN, HLD who tested positive for COVID-19 on 10/19-presented to the hospital with 5-day history of weakness, exertional dyspnea-found to have acute hypoxic respiratory failure and admitted to the hospitalist service. 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    Clinical Impression  Pt admitted with above diagnosis. PTA pt lived alone independent. On eval, she required min guard assist transfers and ambulation 10' + 20' without AD. SpO2 93% at rest on 3L. Desat to 70% on 4L after amb 10', seated rest break to recover. Then amb 20' on 6L with desat to 70%. Pt recovered to 94% in recliner and returned to 3L. Pt currently with functional limitations due to the deficits listed below (see PT Problem List). Pt will benefit from skilled PT to increase their independence and safety with mobility to allow discharge to the venue listed below.       Follow Up Recommendations Home health PT    Equipment Recommendations  None recommended by PT    Recommendations for Other Services       Precautions / Restrictions Precautions Precautions: Other (comment) Precaution Comments: watch sats Restrictions Weight Bearing Restrictions: No      Mobility  Bed Mobility Overal bed mobility: Needs Assistance Bed Mobility: Supine to Sit     Supine to sit: Supervision;HOB elevated     General bed mobility comments: +rail, supervision for safety    Transfers Overall transfer level: Needs assistance Equipment used: None Transfers: Sit to/from Stand;Stand Pivot Transfers Sit to Stand: Min guard Stand pivot transfers: Min guard       General transfer comment: min guard for safety  Ambulation/Gait Ambulation/Gait assistance: Min guard Gait Distance (Feet): 20 Feet (+ 10) Assistive device: None Gait  Pattern/deviations: Step-through pattern;Decreased stride length Gait velocity: decreased Gait velocity interpretation: <1.31 ft/sec, indicative of household ambulator General Gait Details: Ambulated 10' on 4L with desat to 70%. Seated rest break x 3-5 minutes to recover to 90%. O2 increased to 6L and ambulated 20' with desat to 70%.  Stairs            Wheelchair Mobility    Modified Rankin (Stroke Patients Only)       Balance Overall balance assessment: Mild deficits observed, not formally tested                                           Pertinent Vitals/Pain Pain Assessment: No/denies pain    Home Living Family/patient expects to be discharged to:: Private residence Living Arrangements: Alone Available Help at Discharge: Friend(s);Available PRN/intermittently Type of Home: House Home Access: Stairs to enter;Level entry Entrance Stairs-Rails: Right;Left;Can reach both Entrance Stairs-Number of Steps: 4 steps in front, back entrance no stairs Home Layout: Two level;Able to live on main level with bedroom/bathroom Home Equipment: Kasandra Knudsen - single point      Prior Function Level of Independence: Independent               Hand Dominance        Extremity/Trunk Assessment   Upper Extremity Assessment Upper Extremity Assessment: Generalized weakness    Lower Extremity Assessment Lower Extremity Assessment: Generalized weakness    Cervical / Trunk Assessment Cervical / Trunk Assessment: Normal  Communication  Communication: No difficulties  Cognition Arousal/Alertness: Awake/alert Behavior During Therapy: Flat affect;WFL for tasks assessed/performed Overall Cognitive Status: Within Functional Limits for tasks assessed                                 General Comments: Difficult to read. Pt somewhat resistant to sharing how she was feeling, i.e. SOB, dizzy.      General Comments General comments (skin integrity, edema,  etc.): SpO2 93% at rest on 3L.    Exercises General Exercises - Lower Extremity Ankle Circles/Pumps: AROM;Both;10 reps;Seated Long Arc Quad: AROM;Right;Left;10 reps;Seated   Assessment/Plan    PT Assessment Patient needs continued PT services  PT Problem List Decreased strength;Decreased mobility;Decreased activity tolerance;Cardiopulmonary status limiting activity       PT Treatment Interventions DME instruction;Therapeutic activities;Gait training;Therapeutic exercise;Patient/family education;Balance training;Functional mobility training    PT Goals (Current goals can be found in the Care Plan section)  Acute Rehab PT Goals Patient Stated Goal: home PT Goal Formulation: With patient Time For Goal Achievement: 11/13/19 Potential to Achieve Goals: Good    Frequency Min 3X/week   Barriers to discharge        Co-evaluation               AM-PAC PT "6 Clicks" Mobility  Outcome Measure Help needed turning from your back to your side while in a flat bed without using bedrails?: None Help needed moving from lying on your back to sitting on the side of a flat bed without using bedrails?: A Little Help needed moving to and from a bed to a chair (including a wheelchair)?: A Little Help needed standing up from a chair using your arms (e.g., wheelchair or bedside chair)?: A Little Help needed to walk in hospital room?: A Little Help needed climbing 3-5 steps with a railing? : A Lot 6 Click Score: 18    End of Session Equipment Utilized During Treatment: Gait belt;Oxygen Activity Tolerance: Patient tolerated treatment well Patient left: in chair;with call bell/phone within reach Nurse Communication: Mobility status PT Visit Diagnosis: Difficulty in walking, not elsewhere classified (R26.2)    Time: 0315-9458 PT Time Calculation (min) (ACUTE ONLY): 20 min   Charges:   PT Evaluation $PT Eval Moderate Complexity: 1 Mod          Lorrin Goodell, PT  Office #  [phone] Pager [phone]   Lorriane Shire 10/30/2019, 11:32 AM

## 2019-10-30 NOTE — Progress Notes (Signed)
Occupational Therapy Treatment Patient Details Name: Pamela Roth MRN: [id number] DOB: [birth year] Today's Date: 10/30/2019    History of present illness 69 y.o. female with PMHx of COPD COPD, HTN, HLD who tested positive for COVID-19 on 10/19-presented to the hospital with 5-day history of weakness, exertional dyspnea-found to have acute hypoxic respiratory failure and admitted to the hospitalist service. 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   OT comments  PTA, pt was living alone and was independent. Pt currently requiring Supervision-Min Guard A for ADLs and functional mobility. Pt performing toileting, hand hygiene, and functional mobility in hallway. Maintaining SpO2 >85% on 3L. Taking standing rest breaks for purse lip breathing. Pt would benefit from further acute OT to facilitate safe dc. Recommend dc to home with HHOT for further OT to optimize safety, independence with ADLs, and return to PLOF.    Follow Up Recommendations  Home health OT;Supervision - Intermittent    Equipment Recommendations  3 in 1 bedside commode (as shower chair)    Recommendations for Other Services PT consult    Precautions / Restrictions Precautions Precautions: Other (comment) Precaution Comments: watch sats       Mobility Bed Mobility Overal bed mobility: Needs Assistance Bed Mobility: Sit to Supine       Sit to supine: Supervision   General bed mobility comments: Supervision for safety  Transfers Overall transfer level: Needs assistance Equipment used: None Transfers: Sit to/from Stand;Stand Pivot Transfers Sit to Stand: Min guard         General transfer comment: min guard for safety    Balance Overall balance assessment: Mild deficits observed, not formally tested                                         ADL either performed or assessed with clinical judgement   ADL Overall ADL's : Needs assistance/impaired Eating/Feeding: Set up;Supervision/  safety;Sitting   Grooming: Supervision/safety;Wash/dry hands;Standing   Upper Body Bathing: Supervision/ safety;Set up;Sitting   Lower Body Bathing: Min guard;Sit to/from stand   Upper Body Dressing : Supervision/safety;Set up;Sitting   Lower Body Dressing: Min guard;Sit to/from stand   Toilet Transfer: Min guard;Ambulation;Regular Toilet;Grab bars   Toileting- Clothing Manipulation and Hygiene: Supervision/safety;Sitting/lateral lean       Functional mobility during ADLs: Min guard General ADL Comments: Presenting with decreased activity tolerance     Vision Baseline Vision/History: Wears glasses Patient Visual Report: No change from baseline     Perception     Praxis      Cognition Arousal/Alertness: Awake/alert Behavior During Therapy: WFL for tasks assessed/performed Overall Cognitive Status: Within Functional Limits for tasks assessed                                          Exercises     Shoulder Instructions       General Comments >85% on 3L O2. HR 90    Pertinent Vitals/ Pain       Pain Assessment: No/denies pain  Home Living Family/patient expects to be discharged to:: Private residence Living Arrangements: Alone Available Help at Discharge: Friend(s);Available PRN/intermittently Type of Home: House Home Access: Stairs to enter;Level entry Entrance Stairs-Number of Steps: 4 steps in front, back entrance no stairs Entrance Stairs-Rails: Right;Left;Can reach both Home Layout: Two level;Able  to live on main level with bedroom/bathroom     Bathroom Shower/Tub: Teacher, early years/pre: Standard     Home Equipment: Cane - single point          Prior Functioning/Environment Level of Independence: Independent        Comments: Written two books   Frequency  Min 2X/week        Progress Toward Goals  OT Goals(current goals can now be found in the care plan section)     Acute Rehab OT Goals Patient Stated  Goal: Go home and rest OT Goal Formulation: With patient Time For Goal Achievement: 11/13/19 Potential to Achieve Goals: Good  Plan      Co-evaluation                 AM-PAC OT "6 Clicks" Daily Activity     Outcome Measure   Help from another person eating meals?: None Help from another person taking care of personal grooming?: A Little Help from another person toileting, which includes using toliet, bedpan, or urinal?: A Little Help from another person bathing (including washing, rinsing, drying)?: A Little Help from another person to put on and taking off regular upper body clothing?: A Little Help from another person to put on and taking off regular lower body clothing?: A Little 6 Click Score: 19    End of Session Equipment Utilized During Treatment: Oxygen (3L)  OT Visit Diagnosis: Unsteadiness on feet (R26.81);Other abnormalities of gait and mobility (R26.89);Muscle weakness (generalized) (M62.81)   Activity Tolerance Patient tolerated treatment well   Patient Left in bed;with call bell/phone within reach   Nurse Communication Mobility status        Time: 5638-9373 OT Time Calculation (min): 36 min  Charges: OT General Charges $OT Visit: 1 Visit OT Evaluation $OT Eval Moderate Complexity: 1 Mod OT Treatments $Self Care/Home Management : 8-22 mins  [place], OTR/L Acute Rehab Pager: [phone] Office: [place] 10/30/2019, 5:06 PM

## 2019-10-31 LAB — COMPREHENSIVE METABOLIC PANEL
ALT: 24 U/L (ref 0–44)
AST: 34 U/L (ref 15–41)
Albumin: 2.8 g/dL — ABNORMAL LOW (ref 3.5–5.0)
Alkaline Phosphatase: 39 U/L (ref 38–126)
Anion gap: 6 (ref 5–15)
BUN: 26 mg/dL — ABNORMAL HIGH (ref 8–23)
CO2: 23 mmol/L (ref 22–32)
Calcium: 8.9 mg/dL (ref 8.9–10.3)
Chloride: 109 mmol/L (ref 98–111)
Creatinine, Ser: 0.81 mg/dL (ref 0.44–1.00)
GFR, Estimated: >60 mL/min (ref >60)
Glucose, Bld: 135 mg/dL — ABNORMAL HIGH (ref 70–99)
Potassium: 4.5 mmol/L (ref 3.5–5.1)
Sodium: 138 mmol/L (ref 135–145)
Total Bilirubin: 0.3 mg/dL (ref 0.3–1.2)
Total Protein: 6.1 g/dL — ABNORMAL LOW (ref 6.5–8.1)

## 2019-10-31 LAB — CBC WITH DIFFERENTIAL/PLATELET
Abs Immature Granulocytes: 0.03 10*3/uL (ref 0.00–0.07)
Basophils Absolute: 0 10*3/uL (ref 0.0–0.1)
Basophils Relative: 0 %
Eosinophils Absolute: 0 10*3/uL (ref 0.0–0.5)
Eosinophils Relative: 0 %
HCT: 40.6 % (ref 36.0–46.0)
Hemoglobin: 13.8 g/dL (ref 12.0–15.0)
Immature Granulocytes: 0 %
Lymphocytes Relative: 10 %
Lymphs Abs: 0.9 10*3/uL (ref 0.7–4.0)
MCH: 30.2 pg (ref 26.0–34.0)
MCHC: 34 g/dL (ref 30.0–36.0)
MCV: 88.8 fL (ref 80.0–100.0)
Monocytes Absolute: 0.2 10*3/uL (ref 0.1–1.0)
Monocytes Relative: 2 %
Neutro Abs: 8.1 10*3/uL — ABNORMAL HIGH (ref 1.7–7.7)
Neutrophils Relative %: 88 %
Platelets: 179 10*3/uL (ref 150–400)
RBC: 4.57 MIL/uL (ref 3.87–5.11)
RDW: 15.6 % — ABNORMAL HIGH (ref 11.5–15.5)
WBC: 9.3 10*3/uL (ref 4.0–10.5)
nRBC: 0 % (ref 0.0–0.2)

## 2019-10-31 LAB — FERRITIN: Ferritin: 399 ng/mL — ABNORMAL HIGH (ref 11–307)

## 2019-10-31 LAB — C-REACTIVE PROTEIN: CRP: 0.5 mg/dL (ref <1.0)

## 2019-10-31 LAB — D-DIMER, QUANTITATIVE: D-Dimer, Quant: 0.56 ug/mL-FEU — ABNORMAL HIGH (ref 0.00–0.50)

## 2019-10-31 MED ORDER — METHYLPREDNISOLONE SODIUM SUCC 40 MG IJ SOLR
40.0000 mg | Freq: Two times a day (BID) | INTRAMUSCULAR | Status: DC
  Administered 2019-10-31 – 2019-11-02 (×5): 40 mg via INTRAVENOUS
  Filled 2019-10-31 (×5): qty 1

## 2019-10-31 MED ORDER — FUROSEMIDE 40 MG PO TABS
40.0000 mg | ORAL_TABLET | Freq: Once | ORAL | Status: AC
  Administered 2019-10-31: 40 mg via ORAL
  Filled 2019-10-31: qty 1

## 2019-10-31 NOTE — Progress Notes (Signed)
PROGRESS NOTE                                                                                                                                                                                                             Patient Demographics:    Pamela Roth, is a 69 y.o. female, DOB - February 13, 1950, YWV:[id number]  Outpatient Primary MD for the patient is Kelton Pillar, MD   Admit date - 10/28/2019   LOS - 3  No chief complaint on file.      Brief Narrative: Patient is a 69 y.o. female with PMHx of COPD COPD, HTN, HLD who tested positive for COVID-19 on 10/19-presented to the hospital with 5-day history of weakness, exertional dyspnea-found to have acute hypoxic respiratory failure and admitted to the hospitalist service.  See below for further details  COVID-19 vaccinated status: First dose of Pfizer on 10/13  Significant Events: 10/21>> Admit to [facility] for hypoxia in the setting of COVID-19 infection  Significant studies: 10/20>>Chest x-ray: Mild bibasilar atelectasis 10/20>> CTA chest: No PE, interstitial peripheral groundglass opacities  COVID-19 medications: Steroids: 10/20>> Remdesivir: 10/20>>  Antibiotics: None  Microbiology data: 10/20 >>blood culture: No growth  Procedures: None  Consults: None  DVT prophylaxis: enoxaparin (LOVENOX) injection 40 mg Start: 10/28/19 1615 SCDs Start: 10/28/19 1603    Subjective:   Stable at rest-still with exertional dyspnea-on 2-3 L of oxygen to maintain O2 saturations.  Note-O2 saturation probe is extremely sensitive fluctuates-however when probe changed-patient denies still-O2 saturations are in the low 90s on 3 L.   Assessment  & Plan :   Acute Hypoxic Resp Failure due to Covid 19 Viral pneumonia: Remains stable on 2-3 L of oxygen-continues to complain of exertional dyspnea.  Although no obvious signs of volume overload-we will give one dose of IV Lasix to  see if this improves her exertional dyspnea symptoms.  Continue at attempts to slowly titrate down FiO2.    Note if hypoxemia were to worsen-patient has consented to the use of Actemra/baricitinib.  Rationale/risk/benefits of these agents discussed-patient consents.  She has no history of TB, hepatitis B or diverticulitis.  Fever: afebrile O2 requirements:  SpO2: (!) 89 % O2 Flow Rate (L/min): 3 L/min   COVID-19 Labs: Recent Labs    10/29/19 0358 10/30/19 0319 10/31/19 0301  DDIMER 0.98* 0.80* 0.56*  FERRITIN 222  400* 399*  CRP 0.7 0.7 0.5       Component Value Date/Time   BNP 37.3 10/28/2019 1850    Recent Labs  Lab 10/28/19 0811  PROCALCITON 0.19    Lab Results  Component Value Date   SARSCOV2NAA POSITIVE (A) 10/28/2019   [place] NEGATIVE 05/01/2019     Prone/Incentive Spirometry: encouraged incentive spirometry use 3-4/hour.  Mild leukopenia/thrombocytopenia: Secondary to COVID-19-resolved.  AKI: Mild-likely hemodynamically mediated-resolved with supportive care.  HTN: BP stable-losartan on hold-continue to monitor off antihypertensives for now  HLD: Continue statin  COPD: Not in exacerbation-continue bronchodilators  2.1 cm left thyroid nodule: Stable for outpatient monitoring   ABG:    Component Value Date/Time   PHART 7.387 10/28/2006 0541   PCO2ART 44.2 10/28/2006 0541   PO2ART 97.7 10/28/2006 0541   HCO3 26.2 (H) 10/28/2006 0541   TCO2 23.5 10/28/2006 0541   ACIDBASEDEF 4.7 (H) 10/27/2006 2033   O2SAT 98.1 10/28/2006 0541    Vent Settings: N/A    Condition -Guarded  Family Communication  : Daughter Jarah Pember [phone] -on 10/23  Code Status :  Full Code  Diet :  Diet Order            Diet 2 gram sodium Room service appropriate? Yes; Fluid consistency: Thin  Diet effective now                  Disposition Plan  :   Status is: Inpatient  Remains inpatient appropriate because:Inpatient level of care appropriate  due to severity of illness   Dispo: The patient is from: Home              Anticipated d/c is to: Home              Anticipated d/c date is: 3 days              Patient currently is not medically stable to d/c.   Barriers to discharge: Hypoxia requiring O2 supplementation/complete 5 days of IV Remdesivir  Antimicorbials  :    Anti-infectives (From admission, onward)   Start     Dose/Rate Route Frequency Ordered Stop   10/29/19 1000  remdesivir 100 mg in sodium chloride 0.9 % 100 mL IVPB       "Followed by" Linked Group Details   100 mg 200 mL/hr over 30 Minutes Intravenous Every 24 hours 10/28/19 1111 11/02/19 0959   10/29/19 1000  remdesivir 100 mg in sodium chloride 0.9 % 100 mL IVPB  Status:  Discontinued       "Followed by" Linked Group Details   100 mg 200 mL/hr over 30 Minutes Intravenous Daily 10/28/19 1604 10/28/19 1616   10/28/19 1615  remdesivir 200 mg in sodium chloride 0.9% 250 mL IVPB  Status:  Discontinued       "Followed by" Linked Group Details   200 mg 580 mL/hr over 30 Minutes Intravenous Once 10/28/19 1604 10/28/19 1616   10/28/19 1200  remdesivir 200 mg in sodium chloride 0.9% 250 mL IVPB       "Followed by" Linked Group Details   200 mg 580 mL/hr over 30 Minutes Intravenous Once 10/28/19 1111 10/28/19 1301      Inpatient Medications  Scheduled Meds: . vitamin C  500 mg Oral Daily  . enoxaparin (LOVENOX) injection  40 mg Subcutaneous Q24H  . methylPREDNISolone (SOLU-MEDROL) injection  40 mg Intravenous Q12H  . zinc sulfate  220 mg Oral Daily   Continuous Infusions: . remdesivir 100 mg  in NS 100 mL 100 mg (10/31/19 1008)   PRN Meds:.acetaminophen, chlorpheniramine-HYDROcodone, guaiFENesin-dextromethorphan, ondansetron **OR** ondansetron (ZOFRAN) IV   Time Spent in minutes  25  See all Orders from today for further details   Oren Binet M.D on 10/31/2019 at 1:48 PM  To page go to [email or web address] - use universal password  [facility] -   Office  [phone]    Objective:   Vitals:   10/30/19 2346 10/31/19 0343 10/31/19 0755 10/31/19 1249  BP: 129/72 131/78 126/76 116/70  Pulse: 65 60 62 68  Resp: 18 19 (!) 21 19  Temp: 97.8 F (36.6 C) 97.7 F (36.5 C) (!) 97.3 F (36.3 C) 98.2 F (36.8 C)  TempSrc: Oral Oral Oral Oral  SpO2: (!) 86% (!) 88% (!) 88% (!) 89%  Weight:      Height:        Wt Readings from Last 3 Encounters:  10/28/19 79.4 kg  05/06/19 76.3 kg  04/27/19 76.3 kg     Intake/Output Summary (Last 24 hours) at 10/31/2019 1348 Last data filed at 10/31/2019 0859 Gross per 24 hour  Intake 200 ml  Output --  Net 200 ml     Physical Exam Gen Exam:Alert awake-not in any distress HEENT:atraumatic, normocephalic Chest: B/L clear to auscultation anteriorly CVS:S1S2 regular Abdomen:soft non tender, non distended Extremities:no edema Neurology: Non focal Skin: no rash   Data Review:    CBC Recent Labs  Lab 10/28/19 0811 10/29/19 0358 10/30/19 0319 10/31/19 0301  WBC 3.5* 3.3* 6.7 9.3  HGB 14.3 13.9 14.1 13.8  HCT 43.2 41.6 41.4 40.6  PLT 127* 145* 172 179  MCV 91.3 90.0 90.0 88.8  MCH 30.2 30.1 30.7 30.2  MCHC 33.1 33.4 34.1 34.0  RDW 15.8* 15.8* 15.8* 15.6*  LYMPHSABS 0.9 0.7 0.7 0.9  MONOABS 0.1 0.1 0.1 0.2  EOSABS 0.0 0.0 0.0 0.0  BASOSABS 0.0 0.0 0.0 0.0    Chemistries  Recent Labs  Lab 10/28/19 0811 10/29/19 0358 10/30/19 0319 10/31/19 0301  NA 132* 134* 138 138  K 4.5 4.6 4.4 4.5  CL 102 106 107 109  CO2 19* 21* 23 23  GLUCOSE 124* 135* 146* 135*  BUN 21 19 25* 26*  CREATININE 1.29* 0.94 1.01* 0.81  CALCIUM 8.7* 8.5* 9.0 8.9  MG  --  2.1  --   --   AST 46* 39 39 34  ALT 23 23 25 24   ALKPHOS 39 40 42 39  BILITOT 0.7 0.4 0.5 0.3   ------------------------------------------------------------------------------------------------------------------ No results for input(s): CHOL, HDL, LDLCALC, TRIG, CHOLHDL, LDLDIRECT in the last 72 hours.  No results found  for: HGBA1C ------------------------------------------------------------------------------------------------------------------ No results for input(s): TSH, T4TOTAL, T3FREE, THYROIDAB in the last 72 hours.  Invalid input(s): FREET3 ------------------------------------------------------------------------------------------------------------------ Recent Labs    10/30/19 0319 10/31/19 0301  FERRITIN 400* 399*    Coagulation profile No results for input(s): INR, PROTIME in the last 168 hours.  Recent Labs    10/30/19 0319 10/31/19 0301  DDIMER 0.80* 0.56*    Cardiac Enzymes No results for input(s): CKMB, TROPONINI, MYOGLOBIN in the last 168 hours.  Invalid input(s): CK ------------------------------------------------------------------------------------------------------------------    Component Value Date/Time   BNP 37.3 10/28/2019 1850    Micro Results Recent Results (from the past 240 hour(s))  Respiratory Panel by RT PCR (Flu A&B, Covid) - Nasopharyngeal Swab     Status: Abnormal   Collection Time: 10/28/19  8:11 AM   Specimen: Nasopharyngeal Swab  Result Value Ref Range Status  SARS Coronavirus 2 by RT PCR POSITIVE (A) NEGATIVE Final    Comment: emailed L. Berdik RN 10:25 10/28/19 ([email or web address]) (NOTE) SARS-CoV-2 target nucleic acids are DETECTED.  SARS-CoV-2 RNA is generally detectable in upper respiratory specimens  during the acute phase of infection. Positive results are indicative of the presence of the identified virus, but do not rule out bacterial infection or co-infection with other pathogens not detected by the test. Clinical correlation with patient history and other diagnostic information is necessary to determine patient infection status. The expected result is Negative.  Fact Sheet for Patients:  PinkCheek.be  Fact Sheet for Healthcare Providers: GravelBags.it  This test is not yet approved or  cleared by the [place] FDA and  has been authorized for detection and/or diagnosis of SARS-CoV-2 by FDA under an Emergency Use Authorization (EUA).  This EUA will remain in effect (meaning this test can be used) for the duration of  the COVID-19  declaration under Section 564(b)(1) of the Act, 21 U.S.C. section 360bbb-3(b)(1), unless the authorization is terminated or revoked sooner.      Influenza A by PCR NEGATIVE NEGATIVE Final   Influenza B by PCR NEGATIVE NEGATIVE Final    Comment: (NOTE) The Xpert Xpress SARS-CoV-2/FLU/RSV assay is intended as an aid in  the diagnosis of influenza from Nasopharyngeal swab specimens and  should not be used as a sole basis for treatment. Nasal washings and  aspirates are unacceptable for Xpert Xpress SARS-CoV-2/FLU/RSV  testing.  Fact Sheet for Patients: PinkCheek.be  Fact Sheet for Healthcare Providers: GravelBags.it  This test is not yet approved or cleared by the [place] FDA and  has been authorized for detection and/or diagnosis of SARS-CoV-2 by  FDA under an Emergency Use Authorization (EUA). This EUA will remain  in effect (meaning this test can be used) for the duration of the  Covid-19 declaration under Section 564(b)(1) of the Act, 21  U.S.C. section 360bbb-3(b)(1), unless the authorization is  terminated or revoked. Performed at [facility], [place] [address] [place], [place] [address]   Blood Culture (routine x 2)     Status: None (Preliminary result)   Collection Time: 10/28/19  8:23 AM   Specimen: BLOOD  Result Value Ref Range Status   Specimen Description BLOOD RIGHT ANTECUBITAL  Final   Special Requests   Final    BOTTLES DRAWN AEROBIC AND ANAEROBIC Blood Culture results may not be optimal due to an inadequate volume of blood received in culture bottles   Culture   Final    NO GROWTH 3 DAYS Performed at [facility], [place] [address]  [place], [place] [address]    Report Status PENDING  Incomplete  Blood Culture (routine x 2)     Status: None (Preliminary result)   Collection Time: 10/28/19  8:30 AM   Specimen: BLOOD  Result Value Ref Range Status   Specimen Description BLOOD LEFT ANTECUBITAL  Final   Special Requests   Final    BOTTLES DRAWN AEROBIC AND ANAEROBIC Blood Culture results may not be optimal due to an inadequate volume of blood received in culture bottles   Culture   Final    NO GROWTH 3 DAYS Performed at [facility], [place] [address] [place], [place] [address]    Report Status PENDING  Incomplete    Radiology Reports CT Angio Chest PE W and/or Wo Contrast  Result Date: 10/28/2019 CLINICAL DATA:  Weakness, positive COVID-19 EXAM: CT ANGIOGRAPHY CHEST WITH CONTRAST  TECHNIQUE: Multidetector CT imaging of the chest was performed using the standard protocol during bolus administration of intravenous contrast. Multiplanar CT image reconstructions and MIPs were obtained to evaluate the vascular anatomy. CONTRAST:  7mL OMNIPAQUE IOHEXOL 350 MG/ML SOLN COMPARISON:  Chest x-ray today. Previous thyroid biopsy report 06/09/2014. FINDINGS: Cardiovascular: No filling defects in the pulmonary arteries to suggest pulmonary emboli. Aortic atherosclerosis and coronary artery calcifications. Heart is normal size. Aorta is normal caliber. Mediastinum/Nodes: No mediastinal, hilar, or axillary adenopathy. Trachea and esophagus are unremarkable. Left thyroid nodule measuring up to 2.1 cm. Lungs/Pleura: Emphysema. Interstitial thickening peripherally in the lungs most compatible with fibrosis. Dependent and bibasilar atelectasis. No effusions. Upper Abdomen: Imaging into the upper abdomen demonstrates no acute findings. Musculoskeletal: Chest wall soft tissues are unremarkable. No acute bony abnormality. Review of the MIP images confirms the above findings. IMPRESSION: No evidence of pulmonary embolus. Interstitial thickening  peripheral oil with ground-glass opacities most compatible with chronic lung disease/fibrosis. Dependent and bibasilar atelectasis. 2.1 cm left thyroid nodule. This has been evaluated on previous imaging. (ref: J Am Coll Radiol. 2015 Feb;12(2): 143-50).Recommend correlation with previous thyroid biopsy. Aortic Atherosclerosis (ICD10-I70.0). Scattered coronary artery disease. Electronically Signed   By: Rolm Baptise M.D.   On: 10/28/2019 19:47   DG Chest Port 1 View  Result Date: 10/28/2019 CLINICAL DATA:  Shortness of breath EXAM: PORTABLE CHEST 1 VIEW COMPARISON:  April 27, 2019 FINDINGS: There is mild bibasilar atelectasis. The lungs elsewhere are clear. Heart is mildly enlarged with pulmonary vascularity normal. No adenopathy. No bone lesions. There is aortic atherosclerosis. IMPRESSION: Mild bibasilar atelectasis. No edema or airspace opacity. Mild cardiomegaly. Aortic Atherosclerosis (ICD10-I70.0). Electronically Signed   By: Lowella Grip III M.D.   On: 10/28/2019 08:52

## 2019-10-31 NOTE — Progress Notes (Addendum)
Physical Therapy Treatment Patient Details Name: Pamela Roth MRN: [id number] DOB: 07-18-1950 Today's Date: 10/31/2019    History of Present Illness 69 y.o. female with PMHx of COPD COPD, HTN, HLD who tested positive for COVID-19 on 10/19-presented to the hospital with 5-day history of weakness, exertional dyspnea-found to have acute hypoxic respiratory failure and admitted to the hospitalist service. 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    PT Comments    Pt received in recliner, SpO2 92% on 8L. She required supervision sit to stand, min guard assist SPT, min guard assist amb 41' without AD, and supervision bed mobility. Pt mobilized on 8L with SpO2 85%. Pt assisted to bathroom during session. Pt supine in bed at end of session.   Follow Up Recommendations  Home Health PT     Equipment Recommendations  None recommended by PT    Recommendations for Other Services       Precautions / Restrictions Precautions Precautions: Other (comment) Precaution Comments: watch sats    Mobility  Bed Mobility Overal bed mobility: Needs Assistance         Sit to supine: Supervision   General bed mobility comments: Supervision for safety  Transfers Overall transfer level: Needs assistance Equipment used: None Transfers: Sit to/from Stand;Stand Pivot Transfers Sit to Stand: Supervision Stand pivot transfers: Min guard          Ambulation/Gait Ambulation/Gait assistance: Min guard Gait Distance (Feet): 70 Feet Assistive device: None Gait Pattern/deviations: Step-through pattern;Decreased stride length Gait velocity: decreased Gait velocity interpretation: 1.31 - 2.62 ft/sec, indicative of limited community ambulator General Gait Details: Amb in room due to O2 needs. SpO2 92% at rest on 8L. Desat to 85% during amb. Steady gait.   Stairs             Wheelchair Mobility    Modified Rankin (Stroke Patients Only)       Balance Overall balance assessment:  No apparent balance deficits (not formally assessed)                                          Cognition Arousal/Alertness: Awake/alert Behavior During Therapy: WFL for tasks assessed/performed Overall Cognitive Status: Within Functional Limits for tasks assessed                                 General Comments: Pt is very motivated.      Exercises      General Comments General comments (skin integrity, edema, etc.): SpO2 92% at rest on 8L. Amb on 8L with desat to 85%.      Pertinent Vitals/Pain Pain Assessment: No/denies pain    Home Living                      Prior Function            PT Goals (current goals can now be found in the care plan section) Acute Rehab PT Goals Patient Stated Goal: Go home and rest Progress towards PT goals: Progressing toward goals    Frequency    Min 3X/week      PT Plan Discharge plan needs to be updated    Co-evaluation              AM-PAC PT "6 Clicks" Mobility   Outcome Measure  Help needed turning from your back to your side while in a flat bed without using bedrails?: None Help needed moving from lying on your back to sitting on the side of a flat bed without using bedrails?: None Help needed moving to and from a bed to a chair (including a wheelchair)?: A Little Help needed standing up from a chair using your arms (e.g., wheelchair or bedside chair)?: None Help needed to walk in hospital room?: A Little Help needed climbing 3-5 steps with a railing? : A Lot 6 Click Score: 20    End of Session Equipment Utilized During Treatment: Oxygen Activity Tolerance: Patient tolerated treatment well Patient left: in bed;with call bell/phone within reach Nurse Communication: Mobility status PT Visit Diagnosis: Difficulty in walking, not elsewhere classified (R26.2)     Time: 7048-8891 PT Time Calculation (min) (ACUTE ONLY): 25 min  Charges:  $Gait Training: 23-37 mins                      Lorrin Goodell, PT  Office # [phone] Pager [phone]    Lorriane Shire 10/31/2019, 2:08 PM

## 2019-11-01 LAB — COMPREHENSIVE METABOLIC PANEL
ALT: 26 U/L (ref 0–44)
AST: 28 U/L (ref 15–41)
Albumin: 2.9 g/dL — ABNORMAL LOW (ref 3.5–5.0)
Alkaline Phosphatase: 45 U/L (ref 38–126)
Anion gap: 9 (ref 5–15)
BUN: 25 mg/dL — ABNORMAL HIGH (ref 8–23)
CO2: 25 mmol/L (ref 22–32)
Calcium: 9 mg/dL (ref 8.9–10.3)
Chloride: 105 mmol/L (ref 98–111)
Creatinine, Ser: 0.94 mg/dL (ref 0.44–1.00)
GFR, Estimated: >60 mL/min (ref >60)
Glucose, Bld: 151 mg/dL — ABNORMAL HIGH (ref 70–99)
Potassium: 4.1 mmol/L (ref 3.5–5.1)
Sodium: 139 mmol/L (ref 135–145)
Total Bilirubin: 0.7 mg/dL (ref 0.3–1.2)
Total Protein: 6.1 g/dL — ABNORMAL LOW (ref 6.5–8.1)

## 2019-11-01 LAB — CBC WITH DIFFERENTIAL/PLATELET
Abs Immature Granulocytes: 0.05 10*3/uL (ref 0.00–0.07)
Basophils Absolute: 0 10*3/uL (ref 0.0–0.1)
Basophils Relative: 0 %
Eosinophils Absolute: 0 10*3/uL (ref 0.0–0.5)
Eosinophils Relative: 0 %
HCT: 42.5 % (ref 36.0–46.0)
Hemoglobin: 14.5 g/dL (ref 12.0–15.0)
Immature Granulocytes: 1 %
Lymphocytes Relative: 10 %
Lymphs Abs: 0.8 10*3/uL (ref 0.7–4.0)
MCH: 30.2 pg (ref 26.0–34.0)
MCHC: 34.1 g/dL (ref 30.0–36.0)
MCV: 88.5 fL (ref 80.0–100.0)
Monocytes Absolute: 0.1 10*3/uL (ref 0.1–1.0)
Monocytes Relative: 2 %
Neutro Abs: 7.7 10*3/uL (ref 1.7–7.7)
Neutrophils Relative %: 87 %
Platelets: 209 10*3/uL (ref 150–400)
RBC: 4.8 MIL/uL (ref 3.87–5.11)
RDW: 15.4 % (ref 11.5–15.5)
WBC: 8.7 10*3/uL (ref 4.0–10.5)
nRBC: 0 % (ref 0.0–0.2)

## 2019-11-01 LAB — FERRITIN: Ferritin: 332 ng/mL — ABNORMAL HIGH (ref 11–307)

## 2019-11-01 LAB — D-DIMER, QUANTITATIVE: D-Dimer, Quant: 0.42 ug/mL-FEU (ref 0.00–0.50)

## 2019-11-01 LAB — C-REACTIVE PROTEIN: CRP: 0.8 mg/dL (ref <1.0)

## 2019-11-01 MED ORDER — WHITE PETROLATUM EX OINT
TOPICAL_OINTMENT | CUTANEOUS | Status: DC | PRN
  Filled 2019-11-01: qty 28.35

## 2019-11-01 MED ORDER — SALINE SPRAY 0.65 % NA SOLN
1.0000 | NASAL | Status: DC | PRN
  Administered 2019-11-02: 1 via NASAL
  Filled 2019-11-01: qty 44

## 2019-11-01 MED ORDER — OXYMETAZOLINE HCL 0.05 % NA SOLN
1.0000 | Freq: Two times a day (BID) | NASAL | Status: DC
  Administered 2019-11-01 – 2019-11-02 (×3): 1 via NASAL
  Filled 2019-11-01: qty 30

## 2019-11-01 NOTE — Progress Notes (Signed)
PROGRESS NOTE                                                                                                                                                                                                             Patient Demographics:    Pamela Roth, is a 69 y.o. female, DOB - 1950-06-04, JYN:[id number]  Outpatient Primary MD for the patient is Kelton Pillar, MD   Admit date - 10/28/2019   LOS - 4  No chief complaint on file.      Brief Narrative: Patient is a 69 y.o. female with PMHx of COPD COPD, HTN, HLD who tested positive for COVID-19 on 10/19-presented to the hospital with 5-day history of weakness, exertional dyspnea-found to have acute hypoxic respiratory failure and admitted to the hospitalist service.  See below for further details  COVID-19 vaccinated status: First dose of Pfizer on 10/13  Significant Events: 10/21>> Admit to [facility] for hypoxia in the setting of COVID-19 infection  Significant studies: 10/20>>Chest x-ray: Mild bibasilar atelectasis 10/20>> CTA chest: No PE, interstitial peripheral groundglass opacities  COVID-19 medications: Steroids: 10/20>> Remdesivir: 10/20>>  Antibiotics: None  Microbiology data: 10/20 >>blood culture: No growth  Procedures: None  Consults: None  DVT prophylaxis: enoxaparin (LOVENOX) injection 40 mg Start: 10/28/19 1615 SCDs Start: 10/28/19 1603    Subjective:   Patient in bed, appears comfortable, denies any headache, no fever, no chest pain or pressure, no shortness of breath , no abdominal pain. No focal weakness.    Assessment  & Plan :   Acute Hypoxic Resp Failure due to Covid 19 Viral pneumonia: Remains stable on 2-3 L of oxygen-continues to complain of exertional dyspnea.  Continue steroid and remdesivir treatment, has consented for Actemra use if needed.  Continue at attempts to slowly titrate down FiO2.    Note if hypoxemia were to  worsen-patient has consented to the use of Actemra/baricitinib.  Rationale/risk/benefits of these agents discussed-patient consents.  She has no history of TB, hepatitis B or diverticulitis.  Fever: afebrile O2 requirements:  SpO2: 95 % O2 Flow Rate (L/min): 4 L/min     Recent Labs  Lab 10/28/19 0811 10/28/19 1850 10/29/19 0358 10/30/19 0319 10/31/19 0301 11/01/19 0355  WBC 3.5*  --  3.3* 6.7 9.3 8.7  HGB 14.3  --  13.9 14.1 13.8 14.5  HCT 43.2  --  41.6 41.4 40.6 42.5  PLT 127*  --  145* 172 179 209  CRP 0.5  --  0.7 0.7 0.5 0.8  BNP  --  37.3  --   --   --   --   DDIMER 0.94*  --  0.98* 0.80* 0.56* 0.42  PROCALCITON 0.19  --   --   --   --   --   AST 46*  --  39 39 34 28  ALT 23  --  23 25 24 26   ALKPHOS 39  --  40 42 39 45  BILITOT 0.7  --  0.4 0.5 0.3 0.7  ALBUMIN 3.2*  --  2.8* 2.8* 2.8* 2.9*  LATICACIDVEN 1.5  --   --   --   --   --   SARSCOV2NAA POSITIVE*  --   --   --   --   --        Prone/Incentive Spirometry: encouraged incentive spirometry use 3-4/hour.  Mild leukopenia/thrombocytopenia: Secondary to COVID-19-resolved.  AKI: Mild-likely hemodynamically mediated-resolved with supportive care.  HTN: BP stable-losartan on hold-continue to monitor off antihypertensives for now  HLD: Continue statin  COPD: Not in exacerbation-continue bronchodilators  2.1 cm left thyroid nodule: Stable for outpatient monitoring, outpatient endocrine follow-up.       Condition - Guarded  Family Communication  : Previous MD updated daughter Troy Hartzog [phone] -on 10/23  Code Status :  Full Code  Diet :  Diet Order            Diet 2 gram sodium Room service appropriate? Yes; Fluid consistency: Thin  Diet effective now                  Disposition Plan  :   Status is: Inpatient  Remains inpatient appropriate because:Inpatient level of care appropriate due to severity of illness   Dispo: The patient is from: Home              Anticipated d/c is  to: Home              Anticipated d/c date is: 3 days              Patient currently is not medically stable to d/c.   Barriers to discharge: Hypoxia requiring O2 supplementation/complete 5 days of IV Remdesivir  Antimicorbials  :    Anti-infectives (From admission, onward)   Start     Dose/Rate Route Frequency Ordered Stop   10/29/19 1000  remdesivir 100 mg in sodium chloride 0.9 % 100 mL IVPB       "Followed by" Linked Group Details   100 mg 200 mL/hr over 30 Minutes Intravenous Every 24 hours 10/28/19 1111 11/01/19 1000   10/29/19 1000  remdesivir 100 mg in sodium chloride 0.9 % 100 mL IVPB  Status:  Discontinued       "Followed by" Linked Group Details   100 mg 200 mL/hr over 30 Minutes Intravenous Daily 10/28/19 1604 10/28/19 1616   10/28/19 1615  remdesivir 200 mg in sodium chloride 0.9% 250 mL IVPB  Status:  Discontinued       "Followed by" Linked Group Details   200 mg 580 mL/hr over 30 Minutes Intravenous Once 10/28/19 1604 10/28/19 1616   10/28/19 1200  remdesivir 200 mg in sodium chloride 0.9% 250 mL IVPB       "Followed by" Linked Group Details   200 mg 580 mL/hr over 30 Minutes Intravenous Once 10/28/19 1111 10/28/19 1301  Inpatient Medications  Scheduled Meds: . vitamin C  500 mg Oral Daily  . enoxaparin (LOVENOX) injection  40 mg Subcutaneous Q24H  . methylPREDNISolone (SOLU-MEDROL) injection  40 mg Intravenous Q12H  . zinc sulfate  220 mg Oral Daily   Continuous Infusions:  PRN Meds:.acetaminophen, chlorpheniramine-HYDROcodone, guaiFENesin-dextromethorphan, [DISCONTINUED] ondansetron **OR** ondansetron (ZOFRAN) IV   Time Spent in minutes  25  See all Orders from today for further details   Lala Lund M.D on 11/01/2019 at 11:51 AM  To page go to [email or web address] - use universal password  [facility] -  Office  [phone]    Objective:   Vitals:   10/31/19 2042 10/31/19 2335 11/01/19 0405 11/01/19 0700  BP: 130/85 122/77 140/71  104/76  Pulse: 66 62 60 65  Resp: 17 20 20  (!) 22  Temp: 98 F (36.7 C) 97.6 F (36.4 C) 98 F (36.7 C) 98 F (36.7 C)  TempSrc: Oral Oral Oral Oral  SpO2: 90% 90% 97% 95%  Weight:      Height:        Wt Readings from Last 3 Encounters:  10/28/19 79.4 kg  05/06/19 76.3 kg  04/27/19 76.3 kg     Intake/Output Summary (Last 24 hours) at 11/01/2019 1151 Last data filed at 11/01/2019 0100 Gross per 24 hour  Intake 360 ml  Output --  Net 360 ml     Physical Exam  Awake Alert, No new F.N deficits, Normal affect [place].AT,PERRAL Supple Neck,No JVD, No cervical lymphadenopathy appriciated.  Symmetrical Chest wall movement, Good air movement bilaterally, CTAB RRR,No Gallops, Rubs or new Murmurs, No Parasternal Heave +ve B.Sounds, Abd Soft, No tenderness, No organomegaly appriciated, No rebound - guarding or rigidity. No Cyanosis, Clubbing or edema, No new Rash or bruise    Data Review:    CBC Recent Labs  Lab 10/28/19 0811 10/29/19 0358 10/30/19 0319 10/31/19 0301 11/01/19 0355  WBC 3.5* 3.3* 6.7 9.3 8.7  HGB 14.3 13.9 14.1 13.8 14.5  HCT 43.2 41.6 41.4 40.6 42.5  PLT 127* 145* 172 179 209  MCV 91.3 90.0 90.0 88.8 88.5  MCH 30.2 30.1 30.7 30.2 30.2  MCHC 33.1 33.4 34.1 34.0 34.1  RDW 15.8* 15.8* 15.8* 15.6* 15.4  LYMPHSABS 0.9 0.7 0.7 0.9 0.8  MONOABS 0.1 0.1 0.1 0.2 0.1  EOSABS 0.0 0.0 0.0 0.0 0.0  BASOSABS 0.0 0.0 0.0 0.0 0.0    Chemistries  Recent Labs  Lab 10/28/19 0811 10/29/19 0358 10/30/19 0319 10/31/19 0301 11/01/19 0355  NA 132* 134* 138 138 139  K 4.5 4.6 4.4 4.5 4.1  CL 102 106 107 109 105  CO2 19* 21* 23 23 25   GLUCOSE 124* 135* 146* 135* 151*  BUN 21 19 25* 26* 25*  CREATININE 1.29* 0.94 1.01* 0.81 0.94  CALCIUM 8.7* 8.5* 9.0 8.9 9.0  MG  --  2.1  --   --   --   AST 46* 39 39 34 28  ALT 23 23 25 24 26   ALKPHOS 39 40 42 39 45  BILITOT 0.7 0.4 0.5 0.3 0.7    ------------------------------------------------------------------------------------------------------------------ No results for input(s): CHOL, HDL, LDLCALC, TRIG, CHOLHDL, LDLDIRECT in the last 72 hours.  No results found for: HGBA1C ------------------------------------------------------------------------------------------------------------------ No results for input(s): TSH, T4TOTAL, T3FREE, THYROIDAB in the last 72 hours.  Invalid input(s): FREET3 ------------------------------------------------------------------------------------------------------------------ Recent Labs    10/31/19 0301 11/01/19 0355  FERRITIN 399* 332*    Coagulation profile No results for input(s): INR, PROTIME in the last 168 hours.  Recent Labs    10/31/19 0301 11/01/19 0355  DDIMER 0.56* 0.42    Cardiac Enzymes No results for input(s): CKMB, TROPONINI, MYOGLOBIN in the last 168 hours.  Invalid input(s): CK ------------------------------------------------------------------------------------------------------------------    Component Value Date/Time   BNP 37.3 10/28/2019 1850    Micro Results Recent Results (from the past 240 hour(s))  Respiratory Panel by RT PCR (Flu A&B, Covid) - Nasopharyngeal Swab     Status: Abnormal   Collection Time: 10/28/19  8:11 AM   Specimen: Nasopharyngeal Swab  Result Value Ref Range Status   SARS Coronavirus 2 by RT PCR POSITIVE (A) NEGATIVE Final    Comment: emailed L. Berdik RN 10:25 10/28/19 ([email or web address]) (NOTE) SARS-CoV-2 target nucleic acids are DETECTED.  SARS-CoV-2 RNA is generally detectable in upper respiratory specimens  during the acute phase of infection. Positive results are indicative of the presence of the identified virus, but do not rule out bacterial infection or co-infection with other pathogens not detected by the test. Clinical correlation with patient history and other diagnostic information is necessary to determine patient infection  status. The expected result is Negative.  Fact Sheet for Patients:  PinkCheek.be  Fact Sheet for Healthcare Providers: GravelBags.it  This test is not yet approved or cleared by the [place] FDA and  has been authorized for detection and/or diagnosis of SARS-CoV-2 by FDA under an Emergency Use Authorization (EUA).  This EUA will remain in effect (meaning this test can be used) for the duration of  the COVID-19  declaration under Section 564(b)(1) of the Act, 21 U.S.C. section 360bbb-3(b)(1), unless the authorization is terminated or revoked sooner.      Influenza A by PCR NEGATIVE NEGATIVE Final   Influenza B by PCR NEGATIVE NEGATIVE Final    Comment: (NOTE) The Xpert Xpress SARS-CoV-2/FLU/RSV assay is intended as an aid in  the diagnosis of influenza from Nasopharyngeal swab specimens and  should not be used as a sole basis for treatment. Nasal washings and  aspirates are unacceptable for Xpert Xpress SARS-CoV-2/FLU/RSV  testing.  Fact Sheet for Patients: PinkCheek.be  Fact Sheet for Healthcare Providers: GravelBags.it  This test is not yet approved or cleared by the [place] FDA and  has been authorized for detection and/or diagnosis of SARS-CoV-2 by  FDA under an Emergency Use Authorization (EUA). This EUA will remain  in effect (meaning this test can be used) for the duration of the  Covid-19 declaration under Section 564(b)(1) of the Act, 21  U.S.C. section 360bbb-3(b)(1), unless the authorization is  terminated or revoked. Performed at [facility], [place] [address], [place], [place] [address]   Blood Culture (routine x 2)     Status: None (Preliminary result)   Collection Time: 10/28/19  8:23 AM   Specimen: BLOOD  Result Value Ref Range Status   Specimen Description BLOOD RIGHT ANTECUBITAL  Final   Special Requests   Final    BOTTLES  DRAWN AEROBIC AND ANAEROBIC Blood Culture results may not be optimal due to an inadequate volume of blood received in culture bottles   Culture   Final    NO GROWTH 4 DAYS Performed at [facility], [place] [address] [place], [place] [address]    Report Status PENDING  Incomplete  Blood Culture (routine x 2)     Status: None (Preliminary result)   Collection Time: 10/28/19  8:30 AM   Specimen: BLOOD  Result Value Ref Range Status   Specimen Description BLOOD LEFT ANTECUBITAL  Final   Special Requests   Final    BOTTLES DRAWN AEROBIC AND ANAEROBIC Blood Culture results may not be optimal due to an inadequate volume of blood received in culture bottles   Culture   Final    NO GROWTH 4 DAYS Performed at [facility], [place] [address] [place], [place] [address]    Report Status PENDING  Incomplete    Radiology Reports CT Angio Chest PE W and/or Wo Contrast  Result Date: 10/28/2019 CLINICAL DATA:  Weakness, positive COVID-19 EXAM: CT ANGIOGRAPHY CHEST WITH CONTRAST TECHNIQUE: Multidetector CT imaging of the chest was performed using the standard protocol during bolus administration of intravenous contrast. Multiplanar CT image reconstructions and MIPs were obtained to evaluate the vascular anatomy. CONTRAST:  70mL OMNIPAQUE IOHEXOL 350 MG/ML SOLN COMPARISON:  Chest x-ray today. Previous thyroid biopsy report 06/09/2014. FINDINGS: Cardiovascular: No filling defects in the pulmonary arteries to suggest pulmonary emboli. Aortic atherosclerosis and coronary artery calcifications. Heart is normal size. Aorta is normal caliber. Mediastinum/Nodes: No mediastinal, hilar, or axillary adenopathy. Trachea and esophagus are unremarkable. Left thyroid nodule measuring up to 2.1 cm. Lungs/Pleura: Emphysema. Interstitial thickening peripherally in the lungs most compatible with fibrosis. Dependent and bibasilar atelectasis. No effusions. Upper Abdomen: Imaging into the upper abdomen demonstrates no  acute findings. Musculoskeletal: Chest wall soft tissues are unremarkable. No acute bony abnormality. Review of the MIP images confirms the above findings. IMPRESSION: No evidence of pulmonary embolus. Interstitial thickening peripheral oil with ground-glass opacities most compatible with chronic lung disease/fibrosis. Dependent and bibasilar atelectasis. 2.1 cm left thyroid nodule. This has been evaluated on previous imaging. (ref: J Am Coll Radiol. 2015 Feb;12(2): 143-50).Recommend correlation with previous thyroid biopsy. Aortic Atherosclerosis (ICD10-I70.0). Scattered coronary artery disease. Electronically Signed   By: Rolm Baptise M.D.   On: 10/28/2019 19:47   DG Chest Port 1 View  Result Date: 10/28/2019 CLINICAL DATA:  Shortness of breath EXAM: PORTABLE CHEST 1 VIEW COMPARISON:  April 27, 2019 FINDINGS: There is mild bibasilar atelectasis. The lungs elsewhere are clear. Heart is mildly enlarged with pulmonary vascularity normal. No adenopathy. No bone lesions. There is aortic atherosclerosis. IMPRESSION: Mild bibasilar atelectasis. No edema or airspace opacity. Mild cardiomegaly. Aortic Atherosclerosis (ICD10-I70.0). Electronically Signed   By: Lowella Grip III M.D.   On: 10/28/2019 08:52

## 2019-11-02 LAB — CBC WITH DIFFERENTIAL/PLATELET
Abs Immature Granulocytes: 0.05 10*3/uL (ref 0.00–0.07)
Basophils Absolute: 0 10*3/uL (ref 0.0–0.1)
Basophils Relative: 0 %
Eosinophils Absolute: 0 10*3/uL (ref 0.0–0.5)
Eosinophils Relative: 0 %
HCT: 40.1 % (ref 36.0–46.0)
Hemoglobin: 13.7 g/dL (ref 12.0–15.0)
Immature Granulocytes: 1 %
Lymphocytes Relative: 11 %
Lymphs Abs: 1 10*3/uL (ref 0.7–4.0)
MCH: 30.3 pg (ref 26.0–34.0)
MCHC: 34.2 g/dL (ref 30.0–36.0)
MCV: 88.7 fL (ref 80.0–100.0)
Monocytes Absolute: 0.3 10*3/uL (ref 0.1–1.0)
Monocytes Relative: 3 %
Neutro Abs: 7.5 10*3/uL (ref 1.7–7.7)
Neutrophils Relative %: 85 %
Platelets: 229 10*3/uL (ref 150–400)
RBC: 4.52 MIL/uL (ref 3.87–5.11)
RDW: 15.4 % (ref 11.5–15.5)
WBC: 8.8 10*3/uL (ref 4.0–10.5)
nRBC: 0 % (ref 0.0–0.2)

## 2019-11-02 LAB — CULTURE, BLOOD (ROUTINE X 2)
Culture: NO GROWTH
Culture: NO GROWTH

## 2019-11-02 LAB — COMPREHENSIVE METABOLIC PANEL
ALT: 23 U/L (ref 0–44)
AST: 20 U/L (ref 15–41)
Albumin: 2.6 g/dL — ABNORMAL LOW (ref 3.5–5.0)
Alkaline Phosphatase: 38 U/L (ref 38–126)
Anion gap: 7 (ref 5–15)
BUN: 24 mg/dL — ABNORMAL HIGH (ref 8–23)
CO2: 24 mmol/L (ref 22–32)
Calcium: 8.9 mg/dL (ref 8.9–10.3)
Chloride: 106 mmol/L (ref 98–111)
Creatinine, Ser: 0.91 mg/dL (ref 0.44–1.00)
GFR, Estimated: >60 mL/min (ref >60)
Glucose, Bld: 163 mg/dL — ABNORMAL HIGH (ref 70–99)
Potassium: 4.3 mmol/L (ref 3.5–5.1)
Sodium: 137 mmol/L (ref 135–145)
Total Bilirubin: 0.4 mg/dL (ref 0.3–1.2)
Total Protein: 6 g/dL — ABNORMAL LOW (ref 6.5–8.1)

## 2019-11-02 LAB — C-REACTIVE PROTEIN: CRP: 0.5 mg/dL (ref <1.0)

## 2019-11-02 LAB — BRAIN NATRIURETIC PEPTIDE: B Natriuretic Peptide: 61.7 pg/mL (ref 0.0–100.0)

## 2019-11-02 LAB — D-DIMER, QUANTITATIVE: D-Dimer, Quant: 0.47 ug/mL-FEU (ref 0.00–0.50)

## 2019-11-02 LAB — MAGNESIUM: Magnesium: 2.1 mg/dL (ref 1.7–2.4)

## 2019-11-02 MED ORDER — GUAIFENESIN-DM 100-10 MG/5ML PO SYRP: 10.0000 mL | ORAL_SOLUTION | Freq: Four times a day (QID) | ORAL | 0 refills | Status: DC | PRN

## 2019-11-02 MED ORDER — ALBUTEROL SULFATE HFA 108 (90 BASE) MCG/ACT IN AERS: 2.0000 | INHALATION_SPRAY | Freq: Four times a day (QID) | RESPIRATORY_TRACT | 0 refills | Status: DC | PRN

## 2019-11-02 MED ORDER — SALINE SPRAY 0.65 % NA SOLN: 1.0000 | NASAL | 0 refills | Status: DC | PRN

## 2019-11-02 NOTE — Discharge Summary (Addendum)
Pamela Roth HXT:[id number] DOB: 1950/05/01 DOA: 10/28/2019  PCP: Kelton Pillar, MD  Admit date: 10/28/2019  Discharge date: 11/02/2019  Admitted From: Home   Disposition:  Home   Recommendations for Outpatient Follow-up:   Follow up with PCP in 1-2 weeks  PCP Please obtain BMP/CBC, 2 view CXR in 1week,  (see Discharge instructions)   PCP Please follow up on the following pending results: Monitor o2 requirements, check CBC, CMP and a two-view chest x-ray in 7 to 10 days.  Review CT report, has a thyroid nodule patient follow-up as needed.   Home Health: PT, RN Equipment/Devices: Home oxygen Consultations: None  Discharge Condition: Stable    CODE STATUS: Full    Diet Recommendation: Heart Healthy   Diet Order            Diet - low sodium heart healthy           Diet 2 gram sodium Room service appropriate? Yes; Fluid consistency: Thin  Diet effective now                  CC - SOB   Brief history of present illness from the day of admission and additional interim summary    Brief Narrative: Patient is a 69 y.o. female with PMHx of COPD COPD, HTN, HLD who tested positive for COVID-19 on 10/19-presented to the hospital with 5-day history of weakness, exertional dyspnea-found to have acute hypoxic respiratory failure and admitted to the hospitalist service.  See below for further details  COVID-19 vaccinated status: First dose of Pfizer on 10/13  Significant Events: 10/21>> Admit to [facility] for hypoxia in the setting of COVID-19 infection  Significant studies: 10/20>>Chest x-ray: Mild bibasilar atelectasis 10/20>> CTA chest: No PE, interstitial peripheral groundglass opacities  COVID-19 medications: Steroids: 10/20>> Remdesivir: 10/20>>                                                                   Hospital Course   Acute Hypoxic Resp Failure due to Covid 19 Viral pneumonia: He had moderate parenchymal lung injury, unfortunately she was not fully vaccinated and received her first [place] dose on 10/21/2019, likely was already infected by that time.  She was treated with steroids and Remdesivir, she is symptom-free at rest on 3 to 4 L nasal cannula oxygen upon ambulation will require up to 5 L.  Currently she is feeling far better and wants to be discharged home, will be discharged home on home oxygen along with a rescue inhaler, home PT and RN, follow with PCP within a week,  At rest she is requiring 3 L of nasal cannula oxygen to keep her pulse ox above 89%, on room air at rest she is 86%.  However is symptom-free at rest on 3 L.  Recent Labs  Lab 10/28/19 0811 10/28/19 0811 10/28/19 1850 10/29/19 0358 10/30/19 0319 10/31/19 0301 11/01/19 0355 11/02/19 0513  WBC 3.5*   < >  --  3.3* 6.7 9.3 8.7 8.8  CRP 0.5   < >  --  0.7 0.7 0.5 0.8 0.5  DDIMER 0.94*   < >  --  0.98* 0.80* 0.56* 0.42 0.47  BNP  --   --  37.3  --   --   --   --  61.7  PROCALCITON 0.19  --   --   --   --   --   --   --   LATICACIDVEN 1.5  --   --   --   --   --   --   --   AST 46*   < >  --  39 39 34 28 20  ALT 23   < >  --  23 25 24 26 23   ALKPHOS 39   < >  --  40 42 39 45 38  BILITOT 0.7   < >  --  0.4 0.5 0.3 0.7 0.4  ALBUMIN 3.2*   < >  --  2.8* 2.8* 2.8* 2.9* 2.6*  SARSCOV2NAA POSITIVE*  --   --   --   --   --   --   --    < > = values in this interval not displayed.       Mild leukopenia/thrombocytopenia: Secondary to COVID-19-resolved.  AKI: Mild-likely hemodynamically mediated-resolved with supportive care.  HTN: BP stable-losartan on hold-continue to monitor off antihypertensives for now  HLD: Continue statin  COPD: Not in exacerbation-continue bronchodilators  2.1 cm left thyroid nodule: Stable for outpatient monitoring, outpatient endocrine follow-up.   Discharge  diagnosis     Principal Problem:   Acute hypoxemic respiratory failure (HCC) Active Problems:   Hyperlipidemia   COPD (chronic obstructive pulmonary disease) (HCC)   Hypertension   AKI (acute kidney injury) ([place])   Leukopenia   Thrombocytopenia ([place])    Discharge instructions    Discharge Instructions    Diet - low sodium heart healthy   Complete by: As directed    Discharge instructions   Complete by: As directed    Follow with Primary MD Kelton Pillar, MD in 7 days   Get CBC, CMP, 2 view Chest X ray -  checked next visit within 1 week by Primary MD   Activity: As tolerated with Full fall precautions use walker/cane & assistance as needed  Disposition Home   Diet: Heart Healthy   Special Instructions: If you have smoked or chewed Tobacco  in the last 2 yrs please stop smoking, stop any regular Alcohol  and or any Recreational drug use.  On your next visit with your primary care physician please Get Medicines reviewed and adjusted.  Please request your Prim.MD to go over all Hospital Tests and Procedure/Radiological results at the follow up, please get all Hospital records sent to your Prim MD by signing hospital release before you go home.  If you experience worsening of your admission symptoms, develop shortness of breath, life threatening emergency, suicidal or homicidal thoughts you must seek medical attention immediately by calling 911 or calling your MD immediately  if symptoms less severe.  You Must read complete instructions/literature along with all the possible adverse reactions/side effects for all the Medicines you take and that have been prescribed to you. Take any new Medicines after you have completely understood and accpet  all the possible adverse reactions/side effects.   Increase activity slowly   Complete by: As directed       Discharge Medications   Allergies as of 11/02/2019   No Known Allergies     Medication List    STOP taking these  medications   aspirin EC 81 MG tablet   gabapentin 100 MG capsule Commonly known as: NEURONTIN   meloxicam 7.5 MG tablet Commonly known as: MOBIC   oxyCODONE-acetaminophen 5-325 MG tablet Commonly known as: PERCOCET/ROXICET   tiZANidine 2 MG tablet Commonly known as: ZANAFLEX     TAKE these medications   acetaminophen 500 MG tablet Commonly known as: TYLENOL Take 1,000 mg by mouth every 6 (six) hours as needed (for hip pain or mild headaches).   albuterol 108 (90 Base) MCG/ACT inhaler Commonly known as: VENTOLIN HFA Inhale 2 puffs into the lungs every 6 (six) hours as needed for wheezing or shortness of breath.   ASPIRIN PO Take 81-325 mg by mouth daily as needed (for hip pain).   guaiFENesin-dextromethorphan 100-10 MG/5ML syrup Commonly known as: ROBITUSSIN DM Take 10 mLs by mouth every 6 (six) hours as needed for cough.   losartan 100 MG tablet Commonly known as: COZAAR Take 100 mg by mouth at bedtime.   naphazoline-glycerin 0.012-0.2 % Soln Commonly known as: CLEAR EYES REDNESS Place 1 drop into both eyes 4 (four) times daily as needed for eye irritation.   One-A-Day Proactive 65+ Tabs Take 1 tablet by mouth daily with breakfast.   SALONPAS EX Place 1 patch onto the skin See admin instructions. Apply 1 patch one to two times a day as needed for hip pain (remove old patch first)   simvastatin 20 MG tablet Commonly known as: ZOCOR Take 20 mg by mouth daily.   sodium chloride 0.65 % Soln nasal spray Commonly known as: OCEAN Place 1 spray into both nostrils as needed for congestion.   Symbicort 80-4.5 MCG/ACT inhaler Generic drug: budesonide-formoterol Inhale 2 puffs into the lungs in the morning and at bedtime.            Durable Medical Equipment  (From admission, onward)         Start     Ordered   11/02/19 0932  For home use only DME oxygen  Once       Question Answer Comment  Length of Need 6 Months   Mode or (Route) Nasal cannula   Liters  per Minute 5   Frequency Continuous (stationary and portable oxygen unit needed)   Oxygen conserving device Yes   Oxygen delivery system Gas      11/02/19 0931           Follow-up Information    Kelton Pillar, MD. Schedule an appointment as soon as possible for a visit in 1 week(s).   Specialty: Family Medicine Contact information: [address] [place] [address] [phone]        Renato Shin, MD. Schedule an appointment as soon as possible for a visit in 1 week(s).   Specialty: Endocrinology Why: Thyroid nodule Contact information: [address] [place] [address] [phone]               Major procedures and Radiology Reports - PLEASE review detailed and final reports thoroughly  -       CT Angio Chest PE W and/or Wo Contrast  Result Date: 10/28/2019 CLINICAL DATA:  Weakness, positive COVID-19 EXAM: CT ANGIOGRAPHY CHEST  WITH CONTRAST TECHNIQUE: Multidetector CT imaging of the chest was performed using the standard protocol during bolus administration of intravenous contrast. Multiplanar CT image reconstructions and MIPs were obtained to evaluate the vascular anatomy. CONTRAST:  49mL OMNIPAQUE IOHEXOL 350 MG/ML SOLN COMPARISON:  Chest x-ray today. Previous thyroid biopsy report 06/09/2014. FINDINGS: Cardiovascular: No filling defects in the pulmonary arteries to suggest pulmonary emboli. Aortic atherosclerosis and coronary artery calcifications. Heart is normal size. Aorta is normal caliber. Mediastinum/Nodes: No mediastinal, hilar, or axillary adenopathy. Trachea and esophagus are unremarkable. Left thyroid nodule measuring up to 2.1 cm. Lungs/Pleura: Emphysema. Interstitial thickening peripherally in the lungs most compatible with fibrosis. Dependent and bibasilar atelectasis. No effusions. Upper Abdomen: Imaging into the upper abdomen demonstrates no acute findings. Musculoskeletal: Chest wall soft tissues are unremarkable.  No acute bony abnormality. Review of the MIP images confirms the above findings. IMPRESSION: No evidence of pulmonary embolus. Interstitial thickening peripheral oil with ground-glass opacities most compatible with chronic lung disease/fibrosis. Dependent and bibasilar atelectasis. 2.1 cm left thyroid nodule. This has been evaluated on previous imaging. (ref: J Am Coll Radiol. 2015 Feb;12(2): 143-50).Recommend correlation with previous thyroid biopsy. Aortic Atherosclerosis (ICD10-I70.0). Scattered coronary artery disease. Electronically Signed   By: Rolm Baptise M.D.   On: 10/28/2019 19:47   DG Chest Port 1 View  Result Date: 10/28/2019 CLINICAL DATA:  Shortness of breath EXAM: PORTABLE CHEST 1 VIEW COMPARISON:  April 27, 2019 FINDINGS: There is mild bibasilar atelectasis. The lungs elsewhere are clear. Heart is mildly enlarged with pulmonary vascularity normal. No adenopathy. No bone lesions. There is aortic atherosclerosis. IMPRESSION: Mild bibasilar atelectasis. No edema or airspace opacity. Mild cardiomegaly. Aortic Atherosclerosis (ICD10-I70.0). Electronically Signed   By: Lowella Grip III M.D.   On: 10/28/2019 08:52    Micro Results     Recent Results (from the past 240 hour(s))  Respiratory Panel by RT PCR (Flu A&B, Covid) - Nasopharyngeal Swab     Status: Abnormal   Collection Time: 10/28/19  8:11 AM   Specimen: Nasopharyngeal Swab  Result Value Ref Range Status   SARS Coronavirus 2 by RT PCR POSITIVE (A) NEGATIVE Final    Comment: emailed L. Berdik RN 10:25 10/28/19 ([email or web address]) (NOTE) SARS-CoV-2 target nucleic acids are DETECTED.  SARS-CoV-2 RNA is generally detectable in upper respiratory specimens  during the acute phase of infection. Positive results are indicative of the presence of the identified virus, but do not rule out bacterial infection or co-infection with other pathogens not detected by the test. Clinical correlation with patient history and other diagnostic  information is necessary to determine patient infection status. The expected result is Negative.  Fact Sheet for Patients:  PinkCheek.be  Fact Sheet for Healthcare Providers: GravelBags.it  This test is not yet approved or cleared by the [place] FDA and  has been authorized for detection and/or diagnosis of SARS-CoV-2 by FDA under an Emergency Use Authorization (EUA).  This EUA will remain in effect (meaning this test can be used) for the duration of  the COVID-19  declaration under Section 564(b)(1) of the Act, 21 U.S.C. section 360bbb-3(b)(1), unless the authorization is terminated or revoked sooner.      Influenza A by PCR NEGATIVE NEGATIVE Final   Influenza B by PCR NEGATIVE NEGATIVE Final    Comment: (NOTE) The Xpert Xpress SARS-CoV-2/FLU/RSV assay is intended as an aid in  the diagnosis of influenza from Nasopharyngeal swab specimens and  should not be used as a sole basis for treatment. Nasal washings and  aspirates are unacceptable for Xpert Xpress SARS-CoV-2/FLU/RSV  testing.  Fact Sheet for Patients: PinkCheek.be  Fact Sheet for Healthcare Providers: GravelBags.it  This test is not yet approved or cleared by the [place] FDA and  has been authorized for detection and/or diagnosis of SARS-CoV-2 by  FDA under an Emergency Use Authorization (EUA). This EUA will remain  in effect (meaning this test can be used) for the duration of the  Covid-19 declaration under Section 564(b)(1) of the Act, 21  U.S.C. section 360bbb-3(b)(1), unless the authorization is  terminated or revoked. Performed at [facility], [place] [address], [place], [place] [address]   Blood Culture (routine x 2)     Status: None (Preliminary result)   Collection Time: 10/28/19  8:23 AM   Specimen: BLOOD  Result Value Ref Range Status   Specimen Description BLOOD RIGHT  ANTECUBITAL  Final   Special Requests   Final    BOTTLES DRAWN AEROBIC AND ANAEROBIC Blood Culture results may not be optimal due to an inadequate volume of blood received in culture bottles   Culture   Final    NO GROWTH 4 DAYS Performed at [facility], [place] [address], [place], [place] [address]    Report Status PENDING  Incomplete  Blood Culture (routine x 2)     Status: None (Preliminary result)   Collection Time: 10/28/19  8:30 AM   Specimen: BLOOD  Result Value Ref Range Status   Specimen Description BLOOD LEFT ANTECUBITAL  Final   Special Requests   Final    BOTTLES DRAWN AEROBIC AND ANAEROBIC Blood Culture results may not be optimal due to an inadequate volume of blood received in culture bottles   Culture   Final    NO GROWTH 4 DAYS Performed at [facility], [place] [address], [place],  [address]    Report Status PENDING  Incomplete    Today   Subjective    Pamela Roth today has no headache,no chest abdominal pain,no new weakness tingling or numbness, feels much better wants to go home today.     Objective   Blood pressure 123/75, pulse 64, temperature (!) 97.4 F (36.3 C), temperature source Oral, resp. rate 18, height 5\' 8"  (1.727 m), weight 79.4 kg, SpO2 90%   Intake/Output Summary (Last 24 hours) at 11/02/2019 0944 Last data filed at 11/01/2019 1825 Gross per 24 hour  Intake 360 ml  Output --  Net 360 ml    Exam  Awake Alert, No new F.N deficits, Normal affect .AT,PERRAL Supple Neck,No JVD, No cervical lymphadenopathy appriciated.  Symmetrical Chest wall movement, Good air movement bilaterally, CTAB RRR,No Gallops,Rubs or new Murmurs, No Parasternal Heave +ve B.Sounds, Abd Soft, Non tender, No organomegaly appriciated, No rebound -guarding or rigidity. No Cyanosis, Clubbing or edema, No new Rash or bruise   Data Review   CBC w Diff:  Lab Results  Component Value Date   WBC 8.8 11/02/2019   HGB 13.7 11/02/2019   HCT 40.1  11/02/2019   PLT 229 11/02/2019   LYMPHOPCT 11 11/02/2019   MONOPCT 3 11/02/2019   EOSPCT 0 11/02/2019   BASOPCT 0 11/02/2019    CMP:  Lab Results  Component Value Date   NA 137 11/02/2019   K 4.3 11/02/2019   CL 106 11/02/2019   CO2 24 11/02/2019   BUN 24 (H) 11/02/2019   CREATININE 0.91 11/02/2019   PROT 6.0 (L) 11/02/2019   ALBUMIN 2.6 (L) 11/02/2019  BILITOT 0.4 11/02/2019   ALKPHOS 38 11/02/2019   AST 20 11/02/2019   ALT 23 11/02/2019  .   Total Time in preparing paper work, data evaluation and todays exam - 71 minutes  Lala Lund M.D on 11/02/2019 at 9:44 AM  [facility]   Office  [phone]

## 2019-11-02 NOTE — Discharge Instructions (Signed)
[facility]                            [place], [place] [address]      Pamela Roth was admitted for severe COVID-19 pneumonia treatment to the Hospital on 10/28/2019 and Discharged  11/02/2019 and should be excused from work/school/travel  for 21 days starting from date -  10/28/2019 , may return to work/school/travel as tolerated .  Call Lala Lund MD, [facility]  [phone] with questions.  Lala Lund M.D on 11/02/2019,at 9:32 AM  [facility]   Office  [phone]      Follow with Primary MD Kelton Pillar, MD in 7 days   Get CBC, CMP, 2 view Chest X ray -  checked next visit within 1 week by Primary MD   Activity: As tolerated with Full fall precautions use walker/cane & assistance as needed  Disposition Home   Diet: Heart Healthy   Special Instructions: If you have smoked or chewed Tobacco  in the last 2 yrs please stop smoking, stop any regular Alcohol  and or any Recreational drug use.  On your next visit with your primary care physician please Get Medicines reviewed and adjusted.  Please request your Prim.MD to go over all Hospital Tests and Procedure/Radiological results at the follow up, please get all Hospital records sent to your Prim MD by signing hospital release before you go home.  If you experience worsening of your admission symptoms, develop shortness of breath, life threatening emergency, suicidal or homicidal thoughts you must seek medical attention immediately by calling 911 or calling your MD immediately  if symptoms less severe.  You Must read complete instructions/literature along with all the possible adverse reactions/side effects for all the Medicines you take and that have been prescribed to you. Take any new Medicines after you have completely understood and accpet all the possible  adverse reactions/side effects.        Person Under Monitoring Name: Pamela [facility]  Location: [address] [place] [address]   Infection Prevention Recommendations for Individuals Confirmed to have, or Being Evaluated for, 2019 Novel Coronavirus (COVID-19) Infection Who Receive Care at Home  Individuals who are confirmed to have, or are being evaluated for, COVID-19 should follow the prevention steps below until a healthcare provider or local or state health department says they can return to normal activities.  Stay home except to get medical care You should restrict activities outside your home, except for getting medical care. Do not go to work, school, or public areas, and do not use public transportation or taxis.  Call ahead before visiting your doctor Before your medical appointment, call the healthcare provider and tell them that you have, or are being evaluated for, COVID-19 infection. This will help the healthcare provider's office take steps to keep other people from getting infected. Ask your healthcare provider to call the local or state health department.  Monitor your symptoms Seek prompt medical attention if your illness is worsening (e.g., difficulty breathing). Before going to your medical appointment,  call the healthcare provider and tell them that you have, or are being evaluated for, COVID-19 infection. Ask your healthcare provider to call the local or state health department.  Wear a facemask You should wear a facemask that covers your nose and mouth when you are in the same room with other people and when you visit a healthcare provider. People who live with or visit you should also wear a facemask while they are in the same room with you.  Separate yourself from other people in your home As much as possible, you should stay in a different room from other people in your home. Also, you should use a separate bathroom, if  available.  Avoid sharing household items You should not share dishes, drinking glasses, cups, eating utensils, towels, bedding, or other items with other people in your home. After using these items, you should wash them thoroughly with soap and water.  Cover your coughs and sneezes Cover your mouth and nose with a tissue when you cough or sneeze, or you can cough or sneeze into your sleeve. Throw used tissues in a lined trash can, and immediately wash your hands with soap and water for at least 20 seconds or use an alcohol-based hand rub.  Wash your [facility] your hands often and thoroughly with soap and water for at least 20 seconds. You can use an alcohol-based hand sanitizer if soap and water are not available and if your hands are not visibly dirty. Avoid touching your eyes, nose, and mouth with unwashed hands.   Prevention Steps for Caregivers and Household Members of Individuals Confirmed to have, or Being Evaluated for, COVID-19 Infection Being Cared for in the Home  If you live with, or provide care at home for, a person confirmed to have, or being evaluated for, COVID-19 infection please follow these guidelines to prevent infection:  Follow healthcare provider's instructions Make sure that you understand and can help the patient follow any healthcare provider instructions for all care.  Provide for the patient's basic needs You should help the patient with basic needs in the home and provide support for getting groceries, prescriptions, and other personal needs.  Monitor the patient's symptoms If they are getting sicker, call his or her medical provider and tell them that the patient has, or is being evaluated for, COVID-19 infection. This will help the healthcare provider's office take steps to keep other people from getting infected. Ask the healthcare provider to call the local or state health department.  Limit the number of people who have contact with the  patient  If possible, have only one caregiver for the patient.  Other household members should stay in another home or place of residence. If this is not possible, they should stay  in another room, or be separated from the patient as much as possible. Use a separate bathroom, if available.  Restrict visitors who do not have an essential need to be in the home.  Keep older adults, very young children, and other sick people away from the patient Keep older adults, very young children, and those who have compromised immune systems or chronic health conditions away from the patient. This includes people with chronic heart, lung, or kidney conditions, diabetes, and cancer.  Ensure good ventilation Make sure that shared spaces in the home have good air flow, such as from an air conditioner or an opened window, weather permitting.  Wash your hands often  Wash your hands often and thoroughly with soap and  water for at least 20 seconds. You can use an alcohol based hand sanitizer if soap and water are not available and if your hands are not visibly dirty.  Avoid touching your eyes, nose, and mouth with unwashed hands.  Use disposable paper towels to dry your hands. If not available, use dedicated cloth towels and replace them when they become wet.  Wear a facemask and gloves  Wear a disposable facemask at all times in the room and gloves when you touch or have contact with the patient's blood, body fluids, and/or secretions or excretions, such as sweat, saliva, sputum, nasal mucus, vomit, urine, or feces.  Ensure the mask fits over your nose and mouth tightly, and do not touch it during use.  Throw out disposable facemasks and gloves after using them. Do not reuse.  Wash your hands immediately after removing your facemask and gloves.  If your personal clothing becomes contaminated, carefully remove clothing and launder. Wash your hands after handling contaminated clothing.  Place all used  disposable facemasks, gloves, and other waste in a lined container before disposing them with other household waste.  Remove gloves and wash your hands immediately after handling these items.  Do not share dishes, glasses, or other household items with the patient  Avoid sharing household items. You should not share dishes, drinking glasses, cups, eating utensils, towels, bedding, or other items with a patient who is confirmed to have, or being evaluated for, COVID-19 infection.  After the person uses these items, you should wash them thoroughly with soap and water.  Wash laundry thoroughly  Immediately remove and wash clothes or bedding that have blood, body fluids, and/or secretions or excretions, such as sweat, saliva, sputum, nasal mucus, vomit, urine, or feces, on them.  Wear gloves when handling laundry from the patient.  Read and follow directions on labels of laundry or clothing items and detergent. In general, wash and dry with the warmest temperatures recommended on the label.  Clean all areas the individual has used often  Clean all touchable surfaces, such as counters, tabletops, doorknobs, bathroom fixtures, toilets, phones, keyboards, tablets, and bedside tables, every day. Also, clean any surfaces that may have blood, body fluids, and/or secretions or excretions on them.  Wear gloves when cleaning surfaces the patient has come in contact with.  Use a diluted bleach solution (e.g., dilute bleach with 1 part bleach and 10 parts water) or a household disinfectant with a label that says EPA-registered for coronaviruses. To make a bleach solution at home, add 1 tablespoon of bleach to 1 quart (4 cups) of water. For a larger supply, add  cup of bleach to 1 gallon (16 cups) of water.  Read labels of cleaning products and follow recommendations provided on product labels. Labels contain instructions for safe and effective use of the cleaning product including precautions you should  take when applying the product, such as wearing gloves or eye protection and making sure you have good ventilation during use of the product.  Remove gloves and wash hands immediately after cleaning.  Monitor yourself for signs and symptoms of illness Caregivers and household members are considered close contacts, should monitor their health, and will be asked to limit movement outside of the home to the extent possible. Follow the monitoring steps for close contacts listed on the symptom monitoring form.   ? If you have additional questions, contact your local health department or call the epidemiologist on call at [phone] (available 24/7). ? This guidance is subject  to change. For the most up-to-date guidance from [facility], please refer to their website: [email or web address].pl

## 2019-11-02 NOTE — TOC Transition Note (Signed)
Transition of Care [facility]) - CM/SW Discharge Note   Patient Details  Name: TINITA BROOKER MRN: [id number] Date of Birth: 31-Jan-1950  Transition of Care [facility]) CM/SW Contact:  Pollie Friar, RN Phone Number: 11/02/2019, 11:18 AM   Clinical Narrative:    Pt discharging home with [facility] services through [facility]. Helene Kelp with [facility] accepted the referral.  3 in 1 and oxygen to be delivered to the room per [employer]. Pt plans to drive self home.    Final next level of care: Home w Home Health Services Barriers to Discharge: No Barriers Identified   Patient Goals and CMS Choice   CMS Medicare.gov Compare Post Acute Care list provided to:: Patient Choice offered to / list presented to : Patient  Discharge Placement                       Discharge Plan and Services   Discharge Planning Services: CM Consult            DME Arranged: 3-N-1, Oxygen DME Agency: [employer] Date DME Agency Contacted: 11/02/19   Representative spoke with at DME Agency: [place]: RN, PT, OT [facility] Agency: [facility] (now [facility]) Date [place]: 11/02/19   Representative spoke with at [place]: [place]) Interventions     Readmission Risk Interventions No flowsheet data found.

## 2019-11-02 NOTE — Progress Notes (Signed)
Physical Therapy Treatment Patient Details Name: Pamela Roth MRN: [id number] DOB: January 23, 1950 Today's Date: 11/02/2019    History of Present Illness 69 y.o. female with PMHx of COPD COPD, HTN, HLD who tested positive for COVID-19 on 10/19-presented to the hospital with 5-day history of weakness, exertional dyspnea-found to have acute hypoxic respiratory failure and admitted to the hospitalist service. 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    PT Comments    Pt demonstrates modified independence with bed mobility and transfers. Supervision ambulation 150' without AD. Mobilized on 4L O2 with desat to 85%. Quick recovery to 90% with seated rest break. SpO2 94% at rest on 4L. Plan is for d/c home today. Recommending HHPT to assist with mobility progression.    Follow Up Recommendations  Home health PT     Equipment Recommendations  None recommended by PT    Recommendations for Other Services       Precautions / Restrictions Precautions Precautions: Other (comment) Precaution Comments: watch sats Restrictions Weight Bearing Restrictions: No    Mobility  Bed Mobility Overal bed mobility: Modified Independent                Transfers Overall transfer level: Modified independent                  Ambulation/Gait Ambulation/Gait assistance: Supervision Gait Distance (Feet): 150 Feet Assistive device: None Gait Pattern/deviations: Step-through pattern;Decreased stride length Gait velocity: decreased   General Gait Details: Amb on 4L with desat to 85%. SpO2 94% at rest.   Stairs             Wheelchair Mobility    Modified Rankin (Stroke Patients Only)       Balance Overall balance assessment: No apparent balance deficits (not formally assessed)                                          Cognition Arousal/Alertness: Awake/alert Behavior During Therapy: WFL for tasks assessed/performed Overall Cognitive Status: Within  Functional Limits for tasks assessed                                 General Comments: mildly anxious about going home today      Exercises      General Comments General comments (skin integrity, edema, etc.): SpO2 94% at rest on 4L. Desat to 85% during amb on 4L.      Pertinent Vitals/Pain Pain Assessment: No/denies pain    Home Living                      Prior Function            PT Goals (current goals can now be found in the care plan section) Acute Rehab PT Goals Patient Stated Goal: home Progress towards PT goals: Progressing toward goals    Frequency    Min 3X/week      PT Plan Current plan remains appropriate    Co-evaluation              AM-PAC PT "6 Clicks" Mobility   Outcome Measure  Help needed turning from your back to your side while in a flat bed without using bedrails?: None Help needed moving from lying on your back to sitting on the side of a flat bed  without using bedrails?: None Help needed moving to and from a bed to a chair (including a wheelchair)?: None Help needed standing up from a chair using your arms (e.g., wheelchair or bedside chair)?: None Help needed to walk in hospital room?: A Little Help needed climbing 3-5 steps with a railing? : A Lot 6 Click Score: 21    End of Session Equipment Utilized During Treatment: Oxygen Activity Tolerance: Patient tolerated treatment well Patient left: in chair;with call bell/phone within reach Nurse Communication: Mobility status PT Visit Diagnosis: Difficulty in walking, not elsewhere classified (R26.2)     Time: 1100-1116 PT Time Calculation (min) (ACUTE ONLY): 16 min  Charges:  $Gait Training: 8-22 mins                     Lorrin Goodell, PT  Office # [phone] Pager [phone]    Lorriane Shire 11/02/2019, 12:08 PM

## 2019-11-02 NOTE — Progress Notes (Signed)
[place] to be D/C'd Home per MD order.  Discussed with the patient and all questions fully answered.  VSS, Skin clean, dry and intact without evidence of skin break down, no evidence of skin tears noted. IV catheter discontinued intact. Site without signs and symptoms of complications. Dressing and pressure applied.  An After Visit Summary was printed and given to the patient. Patient received oxygen and 3 in 1.  D/c education completed with patient/family including follow up instructions, medication list, d/c activities limitations if indicated, with other d/c instructions as indicated by MD - patient able to verbalize understanding, all questions fully answered.   Patient instructed to return to ED, call 911, or call MD for any changes in condition.   Patient escorted via [place], and D/C home via private auto.  Dene Gentry 11/02/2019 4:04 PM

## 2019-11-02 NOTE — Care Management Important Message (Signed)
Important Message  Patient Details  Name: Pamela Roth MRN: [id number] Date of Birth: October 22, 1950   Medicare Important Message Given:  Yes - Important Message mailed due to current National Emergency  Verbal consent obtained due to current National Emergency  Relationship to patient: Self Contact Name: Alira Fretwell Call Date: 11/02/19  Time: 1533 Phone: [phone] Outcome: Spoke with contact Important Message mailed to: Patient address on file    Pamela Roth 11/02/2019, 3:33 PM

## 2019-11-02 NOTE — Progress Notes (Signed)
Occupational Therapy Treatment Patient Details Name: Pamela Roth MRN: [id number] DOB: 07-17-1950 Today's Date: 11/02/2019    History of present illness 69 y.o. female with PMHx of COPD COPD, HTN, HLD who tested positive for COVID-19 on 10/19-presented to the hospital with 5-day history of weakness, exertional dyspnea-found to have acute hypoxic respiratory failure and admitted to the hospitalist service. 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   OT comments  Pt progressing towards established OT goals. Providing pt with education and handout on energy conservation techniques for ADLs and IADLs in preparation for dc. Discussed techniques for adapting ADLs and IADLs; pt verbalized ways she plans to implement into daily routine. Providing education on use of 3N1 as shower seat for tub; pt verbalized understanding. Continue to recommend dc to home with HHOT and answered all questions in preparation for DC later today.    Follow Up Recommendations  Home health OT;Supervision - Intermittent    Equipment Recommendations  3 in 1 bedside commode (as shower chair)    Recommendations for Other Services PT consult    Precautions / Restrictions Precautions Precautions: Other (comment) Precaution Comments: watch sats Restrictions Weight Bearing Restrictions: No       Mobility Bed Mobility Overal bed mobility: Modified Independent             General bed mobility comments: In recliner upon arrival  Transfers Overall transfer level: Modified independent                    Balance Overall balance assessment: No apparent balance deficits (not formally assessed)                                         ADL either performed or assessed with clinical judgement   ADL Overall ADL's : Needs assistance/impaired                                   Tub/Shower Transfer Details (indicate cue type and reason): Educating pt on use of 3n1 as shower  seat and how to set up in tub.    General ADL Comments: Pt recently walked with PT. Focused session on [facility] education in preparation for dc to home later today. Reviewed handout in full and discussed EC techniques for ADLs and IADLs. Pt verbalized understanding.      Vision       Perception     Praxis      Cognition Arousal/Alertness: Awake/alert Behavior During Therapy: WFL for tasks assessed/performed Overall Cognitive Status: Within Functional Limits for tasks assessed                                 General Comments: mildly anxious about going home today        Exercises     Shoulder Instructions       General Comments SpO2 90s on 4L.    Pertinent Vitals/ Pain       Pain Assessment: No/denies pain  Home Living                                          Prior Functioning/Environment  Frequency  Min 2X/week        Progress Toward Goals  OT Goals(current goals can now be found in the care plan section)  Progress towards OT goals: Progressing toward goals  Acute Rehab OT Goals Patient Stated Goal: home OT Goal Formulation: With patient Time For Goal Achievement: 11/13/19 Potential to Achieve Goals: Good ADL Goals Pt Will Perform Grooming: with modified independence;standing Pt Will Perform Lower Body Dressing: with modified independence;sit to/from stand Pt Will Transfer to Toilet: with modified independence;ambulating;regular height toilet Pt Will Perform Toileting - Clothing Manipulation and hygiene: with modified independence;sitting/lateral leans;sit to/from stand Pt Will Perform Tub/Shower Transfer: Tub transfer;3 in 1;ambulating;with supervision Additional ADL Goal #1: Pt will independently verbalize three energy conservation techniques for ADLs and IADLs Additional ADL Goal #2: Pt will independently monitor SpO2 and use purse lip breathing for ADLs  Plan Discharge plan remains appropriate     Co-evaluation                 AM-PAC OT "6 Clicks" Daily Activity     Outcome Measure   Help from another person eating meals?: None Help from another person taking care of personal grooming?: A Little Help from another person toileting, which includes using toliet, bedpan, or urinal?: A Little Help from another person bathing (including washing, rinsing, drying)?: A Little Help from another person to put on and taking off regular upper body clothing?: A Little Help from another person to put on and taking off regular lower body clothing?: A Little 6 Click Score: 19    End of Session Equipment Utilized During Treatment: Oxygen (4L)  OT Visit Diagnosis: Unsteadiness on feet (R26.81);Other abnormalities of gait and mobility (R26.89);Muscle weakness (generalized) (M62.81)   Activity Tolerance Patient tolerated treatment well   Patient Left in chair;with call bell/phone within reach   Nurse Communication Mobility status;Other (comment) (Pt with questions DC paperwork)        Time: 7530-0511 OT Time Calculation (min): 20 min  Charges: OT General Charges $OT Visit: 1 Visit OT Treatments $Self Care/Home Management : 8-22 mins  [place], OTR/L Acute Rehab Pager: [phone] Office: [place] 11/02/2019, 12:44 PM

## 2019-11-04 DIAGNOSIS — J9601 Acute respiratory failure with hypoxia: Secondary | ICD-10-CM | POA: Diagnosis not present

## 2019-11-04 DIAGNOSIS — E785 Hyperlipidemia, unspecified: Secondary | ICD-10-CM | POA: Diagnosis not present

## 2019-11-04 DIAGNOSIS — Z9181 History of falling: Secondary | ICD-10-CM | POA: Diagnosis not present

## 2019-11-04 DIAGNOSIS — Z9981 Dependence on supplemental oxygen: Secondary | ICD-10-CM | POA: Diagnosis not present

## 2019-11-04 DIAGNOSIS — Z96641 Presence of right artificial hip joint: Secondary | ICD-10-CM | POA: Diagnosis not present

## 2019-11-04 DIAGNOSIS — F32A Depression, unspecified: Secondary | ICD-10-CM | POA: Diagnosis not present

## 2019-11-04 DIAGNOSIS — F419 Anxiety disorder, unspecified: Secondary | ICD-10-CM | POA: Diagnosis not present

## 2019-11-04 DIAGNOSIS — J449 Chronic obstructive pulmonary disease, unspecified: Secondary | ICD-10-CM | POA: Diagnosis not present

## 2019-11-04 DIAGNOSIS — E042 Nontoxic multinodular goiter: Secondary | ICD-10-CM | POA: Diagnosis not present

## 2019-11-04 DIAGNOSIS — I10 Essential (primary) hypertension: Secondary | ICD-10-CM | POA: Diagnosis not present

## 2019-11-04 DIAGNOSIS — U071 COVID-19: Secondary | ICD-10-CM | POA: Diagnosis not present

## 2019-11-10 DIAGNOSIS — J1282 Pneumonia due to coronavirus disease 2019: Secondary | ICD-10-CM | POA: Diagnosis not present

## 2019-11-10 DIAGNOSIS — Z8619 Personal history of other infectious and parasitic diseases: Secondary | ICD-10-CM | POA: Diagnosis not present

## 2019-11-10 DIAGNOSIS — I10 Essential (primary) hypertension: Secondary | ICD-10-CM | POA: Diagnosis not present

## 2019-11-10 DIAGNOSIS — E042 Nontoxic multinodular goiter: Secondary | ICD-10-CM | POA: Diagnosis not present

## 2019-11-10 DIAGNOSIS — J9600 Acute respiratory failure, unspecified whether with hypoxia or hypercapnia: Secondary | ICD-10-CM | POA: Diagnosis not present

## 2019-11-11 ENCOUNTER — Other Ambulatory Visit: Payer: Self-pay | Admitting: Family Medicine

## 2019-11-11 ENCOUNTER — Ambulatory Visit
Admission: RE | Admit: 2019-11-11 | Discharge: 2019-11-11 | Disposition: A | Discharge status: Home / Self Care | Payer: Medicare Other | Source: Ambulatory Visit | Attending: Family Medicine | Admitting: Family Medicine

## 2019-11-11 DIAGNOSIS — J9601 Acute respiratory failure with hypoxia: Secondary | ICD-10-CM | POA: Diagnosis not present

## 2019-11-11 DIAGNOSIS — U071 COVID-19: Secondary | ICD-10-CM | POA: Diagnosis not present

## 2019-11-11 DIAGNOSIS — I10 Essential (primary) hypertension: Secondary | ICD-10-CM | POA: Diagnosis not present

## 2019-11-11 DIAGNOSIS — R918 Other nonspecific abnormal finding of lung field: Secondary | ICD-10-CM | POA: Diagnosis not present

## 2019-11-11 DIAGNOSIS — E042 Nontoxic multinodular goiter: Secondary | ICD-10-CM | POA: Diagnosis not present

## 2019-11-11 DIAGNOSIS — J1282 Pneumonia due to coronavirus disease 2019: Secondary | ICD-10-CM

## 2019-11-11 DIAGNOSIS — J449 Chronic obstructive pulmonary disease, unspecified: Secondary | ICD-10-CM | POA: Diagnosis not present

## 2019-11-11 DIAGNOSIS — I517 Cardiomegaly: Secondary | ICD-10-CM | POA: Diagnosis not present

## 2019-11-11 DIAGNOSIS — I700 Atherosclerosis of aorta: Secondary | ICD-10-CM | POA: Diagnosis not present

## 2019-11-11 DIAGNOSIS — E785 Hyperlipidemia, unspecified: Secondary | ICD-10-CM | POA: Diagnosis not present

## 2019-11-18 DIAGNOSIS — I10 Essential (primary) hypertension: Secondary | ICD-10-CM | POA: Diagnosis not present

## 2019-11-18 DIAGNOSIS — U071 COVID-19: Secondary | ICD-10-CM | POA: Diagnosis not present

## 2019-11-18 DIAGNOSIS — E042 Nontoxic multinodular goiter: Secondary | ICD-10-CM | POA: Diagnosis not present

## 2019-11-18 DIAGNOSIS — J9601 Acute respiratory failure with hypoxia: Secondary | ICD-10-CM | POA: Diagnosis not present

## 2019-11-18 DIAGNOSIS — E785 Hyperlipidemia, unspecified: Secondary | ICD-10-CM | POA: Diagnosis not present

## 2019-11-18 DIAGNOSIS — J449 Chronic obstructive pulmonary disease, unspecified: Secondary | ICD-10-CM | POA: Diagnosis not present

## 2019-11-24 DIAGNOSIS — E785 Hyperlipidemia, unspecified: Secondary | ICD-10-CM | POA: Diagnosis not present

## 2019-11-24 DIAGNOSIS — I10 Essential (primary) hypertension: Secondary | ICD-10-CM | POA: Diagnosis not present

## 2019-11-24 DIAGNOSIS — U071 COVID-19: Secondary | ICD-10-CM | POA: Diagnosis not present

## 2019-11-24 DIAGNOSIS — J449 Chronic obstructive pulmonary disease, unspecified: Secondary | ICD-10-CM | POA: Diagnosis not present

## 2019-11-24 DIAGNOSIS — J9601 Acute respiratory failure with hypoxia: Secondary | ICD-10-CM | POA: Diagnosis not present

## 2019-11-24 DIAGNOSIS — E042 Nontoxic multinodular goiter: Secondary | ICD-10-CM | POA: Diagnosis not present

## 2019-11-25 DIAGNOSIS — I10 Essential (primary) hypertension: Secondary | ICD-10-CM | POA: Diagnosis not present

## 2019-11-25 DIAGNOSIS — J9601 Acute respiratory failure with hypoxia: Secondary | ICD-10-CM | POA: Diagnosis not present

## 2019-11-25 DIAGNOSIS — E042 Nontoxic multinodular goiter: Secondary | ICD-10-CM | POA: Diagnosis not present

## 2019-11-25 DIAGNOSIS — J449 Chronic obstructive pulmonary disease, unspecified: Secondary | ICD-10-CM | POA: Diagnosis not present

## 2019-11-25 DIAGNOSIS — U071 COVID-19: Secondary | ICD-10-CM | POA: Diagnosis not present

## 2019-11-25 DIAGNOSIS — E785 Hyperlipidemia, unspecified: Secondary | ICD-10-CM | POA: Diagnosis not present

## 2019-12-01 DIAGNOSIS — U071 COVID-19: Secondary | ICD-10-CM | POA: Diagnosis not present

## 2019-12-01 DIAGNOSIS — I10 Essential (primary) hypertension: Secondary | ICD-10-CM | POA: Diagnosis not present

## 2019-12-01 DIAGNOSIS — E785 Hyperlipidemia, unspecified: Secondary | ICD-10-CM | POA: Diagnosis not present

## 2019-12-01 DIAGNOSIS — J9601 Acute respiratory failure with hypoxia: Secondary | ICD-10-CM | POA: Diagnosis not present

## 2019-12-01 DIAGNOSIS — J449 Chronic obstructive pulmonary disease, unspecified: Secondary | ICD-10-CM | POA: Diagnosis not present

## 2019-12-01 DIAGNOSIS — E042 Nontoxic multinodular goiter: Secondary | ICD-10-CM | POA: Diagnosis not present

## 2019-12-04 DIAGNOSIS — U071 COVID-19: Secondary | ICD-10-CM | POA: Diagnosis not present

## 2019-12-04 DIAGNOSIS — F419 Anxiety disorder, unspecified: Secondary | ICD-10-CM | POA: Diagnosis not present

## 2019-12-04 DIAGNOSIS — I10 Essential (primary) hypertension: Secondary | ICD-10-CM | POA: Diagnosis not present

## 2019-12-04 DIAGNOSIS — J9601 Acute respiratory failure with hypoxia: Secondary | ICD-10-CM | POA: Diagnosis not present

## 2019-12-04 DIAGNOSIS — Z9181 History of falling: Secondary | ICD-10-CM | POA: Diagnosis not present

## 2019-12-04 DIAGNOSIS — E042 Nontoxic multinodular goiter: Secondary | ICD-10-CM | POA: Diagnosis not present

## 2019-12-04 DIAGNOSIS — E785 Hyperlipidemia, unspecified: Secondary | ICD-10-CM | POA: Diagnosis not present

## 2019-12-04 DIAGNOSIS — J449 Chronic obstructive pulmonary disease, unspecified: Secondary | ICD-10-CM | POA: Diagnosis not present

## 2019-12-04 DIAGNOSIS — Z96641 Presence of right artificial hip joint: Secondary | ICD-10-CM | POA: Diagnosis not present

## 2019-12-04 DIAGNOSIS — Z9981 Dependence on supplemental oxygen: Secondary | ICD-10-CM | POA: Diagnosis not present

## 2019-12-04 DIAGNOSIS — F32A Depression, unspecified: Secondary | ICD-10-CM | POA: Diagnosis not present

## 2019-12-07 DIAGNOSIS — J9601 Acute respiratory failure with hypoxia: Secondary | ICD-10-CM | POA: Diagnosis not present

## 2019-12-07 DIAGNOSIS — E785 Hyperlipidemia, unspecified: Secondary | ICD-10-CM | POA: Diagnosis not present

## 2019-12-07 DIAGNOSIS — I10 Essential (primary) hypertension: Secondary | ICD-10-CM | POA: Diagnosis not present

## 2019-12-07 DIAGNOSIS — U071 COVID-19: Secondary | ICD-10-CM | POA: Diagnosis not present

## 2019-12-07 DIAGNOSIS — J449 Chronic obstructive pulmonary disease, unspecified: Secondary | ICD-10-CM | POA: Diagnosis not present

## 2019-12-07 DIAGNOSIS — E042 Nontoxic multinodular goiter: Secondary | ICD-10-CM | POA: Diagnosis not present

## 2019-12-15 DIAGNOSIS — Z8619 Personal history of other infectious and parasitic diseases: Secondary | ICD-10-CM | POA: Diagnosis not present

## 2019-12-15 DIAGNOSIS — M25559 Pain in unspecified hip: Secondary | ICD-10-CM | POA: Diagnosis not present

## 2019-12-15 DIAGNOSIS — J449 Chronic obstructive pulmonary disease, unspecified: Secondary | ICD-10-CM | POA: Diagnosis not present

## 2019-12-15 DIAGNOSIS — J1282 Pneumonia due to coronavirus disease 2019: Secondary | ICD-10-CM | POA: Diagnosis not present

## 2019-12-15 DIAGNOSIS — Z9981 Dependence on supplemental oxygen: Secondary | ICD-10-CM | POA: Diagnosis not present

## 2019-12-17 DIAGNOSIS — E785 Hyperlipidemia, unspecified: Secondary | ICD-10-CM | POA: Diagnosis not present

## 2019-12-17 DIAGNOSIS — U071 COVID-19: Secondary | ICD-10-CM | POA: Diagnosis not present

## 2019-12-17 DIAGNOSIS — I10 Essential (primary) hypertension: Secondary | ICD-10-CM | POA: Diagnosis not present

## 2019-12-17 DIAGNOSIS — J449 Chronic obstructive pulmonary disease, unspecified: Secondary | ICD-10-CM | POA: Diagnosis not present

## 2019-12-17 DIAGNOSIS — E042 Nontoxic multinodular goiter: Secondary | ICD-10-CM | POA: Diagnosis not present

## 2019-12-17 DIAGNOSIS — J9601 Acute respiratory failure with hypoxia: Secondary | ICD-10-CM | POA: Diagnosis not present

## 2019-12-19 DIAGNOSIS — H40033 Anatomical narrow angle, bilateral: Secondary | ICD-10-CM | POA: Diagnosis not present

## 2019-12-19 DIAGNOSIS — H2513 Age-related nuclear cataract, bilateral: Secondary | ICD-10-CM | POA: Diagnosis not present

## 2019-12-23 DIAGNOSIS — E042 Nontoxic multinodular goiter: Secondary | ICD-10-CM | POA: Diagnosis not present

## 2019-12-23 DIAGNOSIS — I10 Essential (primary) hypertension: Secondary | ICD-10-CM | POA: Diagnosis not present

## 2019-12-23 DIAGNOSIS — E785 Hyperlipidemia, unspecified: Secondary | ICD-10-CM | POA: Diagnosis not present

## 2019-12-23 DIAGNOSIS — U071 COVID-19: Secondary | ICD-10-CM | POA: Diagnosis not present

## 2019-12-23 DIAGNOSIS — J449 Chronic obstructive pulmonary disease, unspecified: Secondary | ICD-10-CM | POA: Diagnosis not present

## 2019-12-23 DIAGNOSIS — J9601 Acute respiratory failure with hypoxia: Secondary | ICD-10-CM | POA: Diagnosis not present

## 2019-12-29 DIAGNOSIS — E785 Hyperlipidemia, unspecified: Secondary | ICD-10-CM | POA: Diagnosis not present

## 2019-12-29 DIAGNOSIS — U071 COVID-19: Secondary | ICD-10-CM | POA: Diagnosis not present

## 2019-12-29 DIAGNOSIS — J449 Chronic obstructive pulmonary disease, unspecified: Secondary | ICD-10-CM | POA: Diagnosis not present

## 2019-12-29 DIAGNOSIS — E042 Nontoxic multinodular goiter: Secondary | ICD-10-CM | POA: Diagnosis not present

## 2019-12-29 DIAGNOSIS — J9601 Acute respiratory failure with hypoxia: Secondary | ICD-10-CM | POA: Diagnosis not present

## 2019-12-29 DIAGNOSIS — I10 Essential (primary) hypertension: Secondary | ICD-10-CM | POA: Diagnosis not present

## 2020-01-03 DIAGNOSIS — E785 Hyperlipidemia, unspecified: Secondary | ICD-10-CM | POA: Diagnosis not present

## 2020-01-03 DIAGNOSIS — F32A Depression, unspecified: Secondary | ICD-10-CM | POA: Diagnosis not present

## 2020-01-03 DIAGNOSIS — E042 Nontoxic multinodular goiter: Secondary | ICD-10-CM | POA: Diagnosis not present

## 2020-01-03 DIAGNOSIS — I10 Essential (primary) hypertension: Secondary | ICD-10-CM | POA: Diagnosis not present

## 2020-01-03 DIAGNOSIS — Z96641 Presence of right artificial hip joint: Secondary | ICD-10-CM | POA: Diagnosis not present

## 2020-01-03 DIAGNOSIS — J9601 Acute respiratory failure with hypoxia: Secondary | ICD-10-CM | POA: Diagnosis not present

## 2020-01-03 DIAGNOSIS — Z9981 Dependence on supplemental oxygen: Secondary | ICD-10-CM | POA: Diagnosis not present

## 2020-01-03 DIAGNOSIS — Z9181 History of falling: Secondary | ICD-10-CM | POA: Diagnosis not present

## 2020-01-03 DIAGNOSIS — J449 Chronic obstructive pulmonary disease, unspecified: Secondary | ICD-10-CM | POA: Diagnosis not present

## 2020-01-03 DIAGNOSIS — F419 Anxiety disorder, unspecified: Secondary | ICD-10-CM | POA: Diagnosis not present

## 2020-01-03 DIAGNOSIS — U071 COVID-19: Secondary | ICD-10-CM | POA: Diagnosis not present

## 2020-01-12 DIAGNOSIS — Z79891 Long term (current) use of opiate analgesic: Secondary | ICD-10-CM | POA: Diagnosis not present

## 2020-01-12 DIAGNOSIS — G894 Chronic pain syndrome: Secondary | ICD-10-CM | POA: Diagnosis not present

## 2020-01-12 DIAGNOSIS — M7062 Trochanteric bursitis, left hip: Secondary | ICD-10-CM | POA: Diagnosis not present

## 2020-01-12 DIAGNOSIS — M47816 Spondylosis without myelopathy or radiculopathy, lumbar region: Secondary | ICD-10-CM | POA: Diagnosis not present

## 2020-01-12 DIAGNOSIS — M7061 Trochanteric bursitis, right hip: Secondary | ICD-10-CM | POA: Diagnosis not present

## 2020-01-13 ENCOUNTER — Encounter: Payer: Self-pay | Admitting: Pulmonary Disease

## 2020-01-13 ENCOUNTER — Ambulatory Visit (INDEPENDENT_AMBULATORY_CARE_PROVIDER_SITE_OTHER): Payer: Medicare Other | Admitting: Pulmonary Disease

## 2020-01-13 ENCOUNTER — Other Ambulatory Visit: Payer: Self-pay

## 2020-01-13 VITALS — BP 126/82 | HR 80 | Temp 97.4°F | Ht 68.0 in | Wt 177.0 lb

## 2020-01-13 DIAGNOSIS — E785 Hyperlipidemia, unspecified: Secondary | ICD-10-CM | POA: Diagnosis not present

## 2020-01-13 DIAGNOSIS — J449 Chronic obstructive pulmonary disease, unspecified: Secondary | ICD-10-CM

## 2020-01-13 DIAGNOSIS — U071 COVID-19: Secondary | ICD-10-CM | POA: Diagnosis not present

## 2020-01-13 DIAGNOSIS — J9601 Acute respiratory failure with hypoxia: Secondary | ICD-10-CM | POA: Diagnosis not present

## 2020-01-13 DIAGNOSIS — J1282 Pneumonia due to coronavirus disease 2019: Secondary | ICD-10-CM | POA: Diagnosis not present

## 2020-01-13 DIAGNOSIS — E042 Nontoxic multinodular goiter: Secondary | ICD-10-CM | POA: Diagnosis not present

## 2020-01-13 DIAGNOSIS — J9611 Chronic respiratory failure with hypoxia: Secondary | ICD-10-CM | POA: Diagnosis not present

## 2020-01-13 DIAGNOSIS — I10 Essential (primary) hypertension: Secondary | ICD-10-CM | POA: Diagnosis not present

## 2020-01-13 MED ORDER — BREZTRI AEROSPHERE 160-9-4.8 MCG/ACT IN AERO: 2.0000 | INHALATION_SPRAY | Freq: Two times a day (BID) | RESPIRATORY_TRACT | 0 refills | Status: DC

## 2020-01-13 NOTE — Progress Notes (Signed)
Synopsis: Referred in 01/2020 for COPD by Maurice Small, MD  Subjective:   PATIENT ID: Pamela Roth GENDER: female DOB: 1950/02/27, MRN: [id number]   HPI  Chief Complaint  Patient presents with  . Consult    Referred by PCP for history of COPD. States her breathing was fine until October 2021. Was diagnosed with COVID and has been on O2 ever since. Uses 1-3L of O2. 1L at rest, 2-3L with exertion.    Pamela Roth is a 70 year old woman, former smoker with COPD/Emphysema who is referred to pulmonary clinic for persistent shortness of breath with exertion and oxygen requirement after covid 19 pneumonia in 10/2019.   She received 1 dose of covid 19 vaccine a week prior to testing positive and being admitted to the hospital 10/28/19 to 11/02/19. She was discharged on 4L of oxygen and has been weaned down to 1L at rest and 2-3L with exertion. She has a home nurse that has been monitoring her oxygen needs and performing ambulatory O2 monitoring. Today she was able to walk 530ft with an oxygen desaturation to 84% on 1L. Previously she was able to walk 364ft on 1L with oxygen desaturation to 80-85% on 12/29/19.   She has a significant smoking history but quit 14 years ago after her husband died of lung cancer and she was diagnosed with COPD. She has been on symbicort inhaler since with solid maintenance of her respiratory symptoms. Prior to covid she did not have issues ambulating or taking care of her activities of daily living.   Review of CTA Chest scan from 10/2019 does not show pulmonary emboli. There is emphysematous changes with interstitial thickening consistent with fibrosis in the periphery mainly in the mid and upper lung fields.   Past Medical History:  Diagnosis Date  . Anxiety   . COPD (chronic obstructive pulmonary disease) (HCC)   . Depression   . Hyperlipidemia   . Hypertension   . Multinodular goiter      Family History  Problem Relation Age of Onset  .  Schizophrenia Mother   . Dementia Mother      Social History   Socioeconomic History  . Marital status: Widowed    Spouse name: Not on file  . Number of children: 1  . Years of education: some college  . Highest education level: Some college, no degree  Occupational History  . Not on file  Tobacco Use  . Smoking status: Former Smoker    Quit date: 01/09/2008    Years since quitting: 12.0  . Smokeless tobacco: Never Used  Vaping Use  . Vaping Use: Never used  Substance and Sexual Activity  . Alcohol use: No    Alcohol/week: 0.0 standard drinks  . Drug use: No  . Sexual activity: Not Currently  Other Topics Concern  . Not on file  Social History Narrative   Lives at home alone   Right handed   Caffeine: drinks 3 cups daily   Social Determinants of Health   Financial Resource Strain: Not on file  Food Insecurity: Not on file  Transportation Needs: Not on file  Physical Activity: Not on file  Stress: Not on file  Social Connections: Not on file  Intimate Partner Violence: Not on file     No Known Allergies   Outpatient Medications Prior to Visit  Medication Sig Dispense Refill  . acetaminophen (TYLENOL) 500 MG tablet Take 1,000 mg by mouth every 6 (six) hours as needed (for hip pain  or mild headaches).    Marland Kitchen albuterol (VENTOLIN HFA) 108 (90 Base) MCG/ACT inhaler Inhale 2 puffs into the lungs every 6 (six) hours as needed for wheezing or shortness of breath. 6.7 g 0  . Camphor-Menthol-Methyl Sal (SALONPAS EX) Place 1 patch onto the skin See admin instructions. Apply 1 patch one to two times a day as needed for hip pain (remove old patch first)    . diclofenac Sodium (VOLTAREN) 1 % GEL Apply topically.    . DULoxetine (CYMBALTA) 30 MG capsule Take 30 mg by mouth daily.    Marland Kitchen losartan (COZAAR) 50 MG tablet Take 50 mg by mouth daily.    . meloxicam (MOBIC) 7.5 MG tablet Take 15 mg by mouth daily.    . Multiple Vitamins-Minerals (ONE-A-DAY PROACTIVE 65+) TABS Take 1 tablet  by mouth daily with breakfast.    . naphazoline-glycerin (CLEAR EYES REDNESS) 0.012-0.2 % SOLN Place 1 drop into both eyes 4 (four) times daily as needed for eye irritation.    . simvastatin (ZOCOR) 20 MG tablet Take 20 mg by mouth daily.   5  . sodium chloride (OCEAN) 0.65 % SOLN nasal spray Place 1 spray into both nostrils as needed for congestion. 30 mL 0  . SYMBICORT 80-4.5 MCG/ACT inhaler Inhale 2 puffs into the lungs in the morning and at bedtime.   12  . ASPIRIN PO Take 81-325 mg by mouth daily as needed (for hip pain).     Marland Kitchen guaiFENesin-dextromethorphan (ROBITUSSIN DM) 100-10 MG/5ML syrup Take 10 mLs by mouth every 6 (six) hours as needed for cough. 118 mL 0  . losartan (COZAAR) 100 MG tablet Take 100 mg by mouth at bedtime.     No facility-administered medications prior to visit.    Review of Systems  Constitutional: Negative for chills, fever, malaise/fatigue and weight loss.  HENT: Negative for congestion, sinus pain and sore throat.   Eyes: Negative.   Respiratory: Positive for shortness of breath. Negative for cough, hemoptysis, sputum production and wheezing.   Cardiovascular: Negative for chest pain, palpitations, orthopnea, claudication, leg swelling and PND.  Gastrointestinal: Negative for heartburn, nausea and vomiting.  Genitourinary: Negative.   Musculoskeletal: Negative for joint pain and myalgias.  Skin: Negative for rash.  Neurological: Negative for dizziness, weakness and headaches.  Endo/Heme/Allergies: Negative.   Psychiatric/Behavioral: Negative.    Objective:   Vitals:   01/13/20 1538  BP: 126/82  Pulse: 80  Temp: (!) 97.4 F (36.3 C)  TempSrc: Temporal  SpO2: 95%  Weight: 177 lb (80.3 kg)  Height: 5\' 8"  (1.727 m)     Physical Exam Constitutional:      General: She is not in acute distress.    Appearance: She is normal weight.  HENT:     Head: Normocephalic and atraumatic.     Nose: Nose normal.     Mouth/Throat:     Mouth: Mucous membranes  are moist.     Pharynx: Oropharynx is clear.  Eyes:     General: No scleral icterus.    Conjunctiva/sclera: Conjunctivae normal.     Pupils: Pupils are equal, round, and reactive to light.  Cardiovascular:     Rate and Rhythm: Normal rate and regular rhythm.     Pulses: Normal pulses.     Heart sounds: Normal heart sounds. No murmur heard.   Pulmonary:     Effort: Pulmonary effort is normal.     Breath sounds: Decreased air movement present. No wheezing, rhonchi or rales.  Abdominal:  General: Bowel sounds are normal.     Palpations: Abdomen is soft.  Musculoskeletal:     Right lower leg: No edema.     Left lower leg: No edema.  Lymphadenopathy:     Cervical: No cervical adenopathy.  Skin:    General: Skin is warm and dry.     Capillary Refill: Capillary refill takes less than 2 seconds.  Neurological:     General: No focal deficit present.     Mental Status: She is alert.  Psychiatric:        Mood and Affect: Mood normal.        Behavior: Behavior normal.        Thought Content: Thought content normal.        Judgment: Judgment normal.    CBC    Component Value Date/Time   WBC 8.8 11/02/2019 0513   RBC 4.52 11/02/2019 0513   HGB 13.7 11/02/2019 0513   HCT 40.1 11/02/2019 0513   PLT 229 11/02/2019 0513   MCV 88.7 11/02/2019 0513   MCH 30.3 11/02/2019 0513   MCHC 34.2 11/02/2019 0513   RDW 15.4 11/02/2019 0513   LYMPHSABS 1.0 11/02/2019 0513   MONOABS 0.3 11/02/2019 0513   EOSABS 0.0 11/02/2019 0513   BASOSABS 0.0 11/02/2019 0513   BMP Latest Ref Rng & Units 11/02/2019 11/01/2019 10/31/2019  Glucose 70 - 99 mg/dL 163(H) 151(H) 135(H)  BUN 8 - 23 mg/dL 24(H) 25(H) 26(H)  Creatinine 0.44 - 1.00 mg/dL 0.91 0.94 0.81  Sodium 135 - 145 mmol/L 137 139 138  Potassium 3.5 - 5.1 mmol/L 4.3 4.1 4.5  Chloride 98 - 111 mmol/L 106 105 109  CO2 22 - 32 mmol/L 24 25 23   Calcium 8.9 - 10.3 mg/dL 8.9 9.0 8.9   Chest imaging: CXR 11/11/19 The cardiomediastinal  silhouette is unchanged and mildly enlarged in contour.Atherosclerotic calcifications of the aorta. No pleural effusion. No pneumothorax. Increased peripheral predominant coarse reticular and consolidative opacities. Visualized abdomen is unremarkable. Chronic mild wedging of a vertebral body at the thoracolumbar junction.  CTA Chest 10/28/2019 Cardiovascular: No filling defects in the pulmonary arteries to suggest pulmonary emboli. Aortic atherosclerosis and coronary artery calcifications. Heart is normal size. Aorta is normal caliber.  Mediastinum/Nodes: No mediastinal, hilar, or axillary adenopathy. Trachea and esophagus are unremarkable. Left thyroid nodule measuring up to 2.1 cm.  Lungs/Pleura: Emphysema. Interstitial thickening peripherally in the lungs most compatible with fibrosis. Dependent and bibasilar atelectasis. No effusions.  Upper Abdomen: Imaging into the upper abdomen demonstrates no acute findings.  Musculoskeletal: Chest wall soft tissues are unremarkable. No acute bony abnormality.  PFT: No flowsheet data found.     Assessment & Plan:   Chronic obstructive pulmonary disease, unspecified COPD type ([place]) - Plan: Pulmonary Function Test, AMB referral to pulmonary rehabilitation  Pneumonia due to COVID-19 virus - Plan: Pulmonary Function Test  Chronic respiratory failure with hypoxia ([place])  Discussion: Pamela Roth is a 70 year old woman, former smoker with COPD/Emphysema who is referred to pulmonary clinic for persistent shortness of breath with exertion and oxygen requirement after covid 19 pneumonia in 10/2019.   Her recent CT Chest scan shows history of emphysema and also shows signs of fibrosis which could be both related to her history of smoking and also due to her covid pneumonia 10/2019. She likely has a significant diffusion defect which is demonstrated by her rapid oxygen desaturations with ambulation with fairly rapid recovery upon rest. We  will obtain pulmonary function tests to  further evaluate her lung function. We will trial her on Breztri inhaler and monitor for any improvement compared to her symbicort which she has been on. If she benefits, then we can send in a prescription for the [facility].   We will also refer her to cardiopulmonary rehab to continue working on her physical ability, strength and endurance.   We will consider follow up imaging with HRCT chest 6 months after her covid infection.   Follow up in 2 months.  Freda Jackson, MD [facility] Office: [phone]   See Amion for Pager Details     Current Outpatient Medications:  .  acetaminophen (TYLENOL) 500 MG tablet, Take 1,000 mg by mouth every 6 (six) hours as needed (for hip pain or mild headaches)., Disp: , Rfl:  .  albuterol (VENTOLIN HFA) 108 (90 Base) MCG/ACT inhaler, Inhale 2 puffs into the lungs every 6 (six) hours as needed for wheezing or shortness of breath., Disp: 6.7 g, Rfl: 0 .  Budeson-Glycopyrrol-Formoterol (BREZTRI AEROSPHERE) 160-9-4.8 MCG/ACT AERO, Inhale 2 puffs into the lungs 2 (two) times daily., Disp: 11.8 g, Rfl: 0 .  Camphor-Menthol-Methyl Sal (SALONPAS EX), Place 1 patch onto the skin See admin instructions. Apply 1 patch one to two times a day as needed for hip pain (remove old patch first), Disp: , Rfl:  .  diclofenac Sodium (VOLTAREN) 1 % GEL, Apply topically., Disp: , Rfl:  .  DULoxetine (CYMBALTA) 30 MG capsule, Take 30 mg by mouth daily., Disp: , Rfl:  .  losartan (COZAAR) 50 MG tablet, Take 50 mg by mouth daily., Disp: , Rfl:  .  meloxicam (MOBIC) 7.5 MG tablet, Take 15 mg by mouth daily., Disp: , Rfl:  .  Multiple Vitamins-Minerals (ONE-A-DAY PROACTIVE 65+) TABS, Take 1 tablet by mouth daily with breakfast., Disp: , Rfl:  .  naphazoline-glycerin (CLEAR EYES REDNESS) 0.012-0.2 % SOLN, Place 1 drop into both eyes 4 (four) times daily as needed for eye irritation., Disp: , Rfl:  .  simvastatin (ZOCOR)  20 MG tablet, Take 20 mg by mouth daily. , Disp: , Rfl: 5 .  sodium chloride (OCEAN) 0.65 % SOLN nasal spray, Place 1 spray into both nostrils as needed for congestion., Disp: 30 mL, Rfl: 0

## 2020-01-13 NOTE — Patient Instructions (Addendum)
Start Breztri Inhaler 2 puffs twice daily  - stop using symbicort while on breztri - Call if you notice improvement in breathing so we can send in prescription  We will schedule you for pulmonary function testing at your earliest convenience.  We will refer you to cardiopulmonary rehab

## 2020-01-15 ENCOUNTER — Encounter (HOSPITAL_COMMUNITY): Payer: Self-pay | Admitting: *Deleted

## 2020-01-15 NOTE — Progress Notes (Signed)
Received referral from  Dr.  Erin Fulling for this pt to participate in pulmonary rehab with the the diagnosis of Emphysema and Pneumonia due to Covid 19. Clinical review of pt follow up appt on 1/5 Pulmonary office note.  Pt with Covid Risk Score - 4. Pt appropriate for scheduling for Pulmonary rehab.  Will forward to support staff for scheduling and verification of insurance eligibility/benefits with pt consent. Cherre Huger, BSN Cardiac and Training and development officer

## 2020-01-18 DIAGNOSIS — M7061 Trochanteric bursitis, right hip: Secondary | ICD-10-CM | POA: Diagnosis not present

## 2020-01-18 DIAGNOSIS — M7062 Trochanteric bursitis, left hip: Secondary | ICD-10-CM | POA: Diagnosis not present

## 2020-01-20 ENCOUNTER — Telehealth (HOSPITAL_COMMUNITY): Payer: Self-pay

## 2020-01-20 NOTE — Telephone Encounter (Signed)
Pt insurance is active and benefits verified through Medicare a/b Co-pay 0, DED $203/0 met, out of pocket 0/0 met, co-insurance 20%. no pre-authorization required.  2ndary insurance is active and benefits verified through [place]. Co-pay 0, DED 0/0 met, out of pocket 0/0 met, co-insurance 0%. No pre-authorization required.

## 2020-01-26 DIAGNOSIS — J9601 Acute respiratory failure with hypoxia: Secondary | ICD-10-CM | POA: Diagnosis not present

## 2020-01-26 DIAGNOSIS — E785 Hyperlipidemia, unspecified: Secondary | ICD-10-CM | POA: Diagnosis not present

## 2020-01-26 DIAGNOSIS — J449 Chronic obstructive pulmonary disease, unspecified: Secondary | ICD-10-CM | POA: Diagnosis not present

## 2020-01-26 DIAGNOSIS — U071 COVID-19: Secondary | ICD-10-CM | POA: Diagnosis not present

## 2020-01-26 DIAGNOSIS — I10 Essential (primary) hypertension: Secondary | ICD-10-CM | POA: Diagnosis not present

## 2020-01-26 DIAGNOSIS — E042 Nontoxic multinodular goiter: Secondary | ICD-10-CM | POA: Diagnosis not present

## 2020-02-02 DIAGNOSIS — I10 Essential (primary) hypertension: Secondary | ICD-10-CM | POA: Diagnosis not present

## 2020-02-02 DIAGNOSIS — J9601 Acute respiratory failure with hypoxia: Secondary | ICD-10-CM | POA: Diagnosis not present

## 2020-02-02 DIAGNOSIS — F32A Depression, unspecified: Secondary | ICD-10-CM | POA: Diagnosis not present

## 2020-02-02 DIAGNOSIS — J449 Chronic obstructive pulmonary disease, unspecified: Secondary | ICD-10-CM | POA: Diagnosis not present

## 2020-02-02 DIAGNOSIS — Z9181 History of falling: Secondary | ICD-10-CM | POA: Diagnosis not present

## 2020-02-02 DIAGNOSIS — U071 COVID-19: Secondary | ICD-10-CM | POA: Diagnosis not present

## 2020-02-02 DIAGNOSIS — F419 Anxiety disorder, unspecified: Secondary | ICD-10-CM | POA: Diagnosis not present

## 2020-02-02 DIAGNOSIS — Z96641 Presence of right artificial hip joint: Secondary | ICD-10-CM | POA: Diagnosis not present

## 2020-02-02 DIAGNOSIS — E785 Hyperlipidemia, unspecified: Secondary | ICD-10-CM | POA: Diagnosis not present

## 2020-02-02 DIAGNOSIS — Z23 Encounter for immunization: Secondary | ICD-10-CM | POA: Diagnosis not present

## 2020-02-02 DIAGNOSIS — E042 Nontoxic multinodular goiter: Secondary | ICD-10-CM | POA: Diagnosis not present

## 2020-02-02 DIAGNOSIS — Z9981 Dependence on supplemental oxygen: Secondary | ICD-10-CM | POA: Diagnosis not present

## 2020-02-09 DIAGNOSIS — U071 COVID-19: Secondary | ICD-10-CM | POA: Diagnosis not present

## 2020-02-09 DIAGNOSIS — M47816 Spondylosis without myelopathy or radiculopathy, lumbar region: Secondary | ICD-10-CM | POA: Diagnosis not present

## 2020-02-09 DIAGNOSIS — M7062 Trochanteric bursitis, left hip: Secondary | ICD-10-CM | POA: Diagnosis not present

## 2020-02-09 DIAGNOSIS — J9601 Acute respiratory failure with hypoxia: Secondary | ICD-10-CM | POA: Diagnosis not present

## 2020-02-09 DIAGNOSIS — E785 Hyperlipidemia, unspecified: Secondary | ICD-10-CM | POA: Diagnosis not present

## 2020-02-09 DIAGNOSIS — E042 Nontoxic multinodular goiter: Secondary | ICD-10-CM | POA: Diagnosis not present

## 2020-02-09 DIAGNOSIS — M7061 Trochanteric bursitis, right hip: Secondary | ICD-10-CM | POA: Diagnosis not present

## 2020-02-09 DIAGNOSIS — G894 Chronic pain syndrome: Secondary | ICD-10-CM | POA: Diagnosis not present

## 2020-02-09 DIAGNOSIS — I10 Essential (primary) hypertension: Secondary | ICD-10-CM | POA: Diagnosis not present

## 2020-02-09 DIAGNOSIS — J449 Chronic obstructive pulmonary disease, unspecified: Secondary | ICD-10-CM | POA: Diagnosis not present

## 2020-02-12 NOTE — Telephone Encounter (Signed)
Called patient to see if she was interested in participating in the Pulmonary Rehab Program. Patient stated yes. Patient will come in for orientation on 03/28/2020@10 :30am and will attend the 11:00am exercise class.  Tourist information centre manager.

## 2020-02-23 ENCOUNTER — Telehealth: Payer: Self-pay | Admitting: Pulmonary Disease

## 2020-02-23 ENCOUNTER — Other Ambulatory Visit: Payer: Self-pay | Admitting: Pulmonary Disease

## 2020-02-23 MED ORDER — BREZTRI AEROSPHERE 160-9-4.8 MCG/ACT IN AERO: 2.0000 | INHALATION_SPRAY | Freq: Two times a day (BID) | RESPIRATORY_TRACT | 6 refills | Status: DC

## 2020-02-23 MED ORDER — BREZTRI AEROSPHERE 160-9-4.8 MCG/ACT IN AERO
2.0000 | INHALATION_SPRAY | Freq: Two times a day (BID) | RESPIRATORY_TRACT | 6 refills | Status: DC
Start: 2020-02-23 — End: 2020-02-23

## 2020-02-23 NOTE — Telephone Encounter (Signed)
Pt returned call

## 2020-02-23 NOTE — Telephone Encounter (Signed)
I have called and LM on VM for the pt to call back 

## 2020-02-23 NOTE — Telephone Encounter (Signed)
Called and spoke with pt and she states that the breztri worked well for her and she needed this sent to the pharmacy.  This has been done. Nothing further is needed.

## 2020-02-24 NOTE — Telephone Encounter (Signed)
The PA has been started. Will follow up

## 2020-02-25 ENCOUNTER — Telehealth: Payer: Self-pay | Admitting: Pulmonary Disease

## 2020-02-25 DIAGNOSIS — J9601 Acute respiratory failure with hypoxia: Secondary | ICD-10-CM

## 2020-02-25 NOTE — Telephone Encounter (Signed)
Called and spoke with pt  She reports that her home health nurse came to visit her today, and told her to be off of her o2 to "strengthen my lungs"  She states that she is getting readings above 90% on ra at rest, but walked approx 150 ft and she dropped to 79%ra  I advised that she should be using her o2 with exertion to maintain sats above 90%  She states she will do this and report if sats with exertion while using o2 are not above 90%   She asked that I call her nurse to tell her that we advised that she use the o2 for exertional use- Anda Kraft [phone]- called and lmtcb.

## 2020-02-25 NOTE — Telephone Encounter (Signed)
Anda Kraft called back and she stated that she is the PT for [facility]---she stated that the pt is normally is normally 95% on room air at rest without oxygen---  Anda Kraft stated that the pt is very anxious about her oxygen levels and was checking these every 15 mins.  Anda Kraft did tell her not to check this so often if she is feeling ok.  Anda Kraft stated that the pulse ox that the pt is using is not always accurate and will read a lower number under 88% at times or lower and the pts vitals are all normal and the pt does not have any SHOB.  Pt is scheduled to start pulmonary rehab on 3/21 but this is a ways out.  Anda Kraft stated that she has not seen the pt in almost 2 weeks and is scheduled to go back next week but was going to discharge the pt at that time.  JD please advise. Thanks

## 2020-02-25 NOTE — Telephone Encounter (Signed)
Patient should continue supplemental oxygen use with working with home PT or when exerting herself. She has signs of fibrosis so this is not abnormal that her oxygen continues to drop when she walks. I would recommend that she continue home PT through this month until she is able to get into pulmonary rehab. I would agree with her PT that she does not need to check her oxygen so frequently. Only if she is feeling symptomatic.   Thanks, Wille Glaser

## 2020-02-29 DIAGNOSIS — E042 Nontoxic multinodular goiter: Secondary | ICD-10-CM | POA: Diagnosis not present

## 2020-02-29 DIAGNOSIS — E785 Hyperlipidemia, unspecified: Secondary | ICD-10-CM | POA: Diagnosis not present

## 2020-02-29 DIAGNOSIS — J449 Chronic obstructive pulmonary disease, unspecified: Secondary | ICD-10-CM | POA: Diagnosis not present

## 2020-02-29 DIAGNOSIS — I10 Essential (primary) hypertension: Secondary | ICD-10-CM | POA: Diagnosis not present

## 2020-02-29 DIAGNOSIS — J9601 Acute respiratory failure with hypoxia: Secondary | ICD-10-CM | POA: Diagnosis not present

## 2020-02-29 DIAGNOSIS — U071 COVID-19: Secondary | ICD-10-CM | POA: Diagnosis not present

## 2020-03-08 DIAGNOSIS — M7062 Trochanteric bursitis, left hip: Secondary | ICD-10-CM | POA: Diagnosis not present

## 2020-03-08 DIAGNOSIS — M47816 Spondylosis without myelopathy or radiculopathy, lumbar region: Secondary | ICD-10-CM | POA: Diagnosis not present

## 2020-03-08 DIAGNOSIS — M7061 Trochanteric bursitis, right hip: Secondary | ICD-10-CM | POA: Diagnosis not present

## 2020-03-08 DIAGNOSIS — G894 Chronic pain syndrome: Secondary | ICD-10-CM | POA: Diagnosis not present

## 2020-03-10 DIAGNOSIS — M47816 Spondylosis without myelopathy or radiculopathy, lumbar region: Secondary | ICD-10-CM | POA: Diagnosis not present

## 2020-03-18 ENCOUNTER — Encounter: Payer: Self-pay | Admitting: Pulmonary Disease

## 2020-03-18 ENCOUNTER — Ambulatory Visit (INDEPENDENT_AMBULATORY_CARE_PROVIDER_SITE_OTHER): Payer: Medicare Other | Admitting: Pulmonary Disease

## 2020-03-18 ENCOUNTER — Telehealth: Payer: Self-pay | Admitting: Pulmonary Disease

## 2020-03-18 ENCOUNTER — Other Ambulatory Visit: Payer: Self-pay

## 2020-03-18 VITALS — BP 128/78 | HR 77 | Ht 66.0 in | Wt 179.6 lb

## 2020-03-18 DIAGNOSIS — U071 COVID-19: Secondary | ICD-10-CM

## 2020-03-18 DIAGNOSIS — J449 Chronic obstructive pulmonary disease, unspecified: Secondary | ICD-10-CM

## 2020-03-18 DIAGNOSIS — J1282 Pneumonia due to coronavirus disease 2019: Secondary | ICD-10-CM | POA: Diagnosis not present

## 2020-03-18 DIAGNOSIS — J9611 Chronic respiratory failure with hypoxia: Secondary | ICD-10-CM | POA: Diagnosis not present

## 2020-03-18 MED ORDER — TRELEGY ELLIPTA 100-62.5-25 MCG/INH IN AEPB: 1.0000 | INHALATION_SPRAY | Freq: Every day | RESPIRATORY_TRACT | 6 refills | Status: DC

## 2020-03-18 MED ORDER — TRELEGY ELLIPTA 100-62.5-25 MCG/INH IN AEPB: 1.0000 | INHALATION_SPRAY | Freq: Every day | RESPIRATORY_TRACT | 0 refills | Status: AC

## 2020-03-18 NOTE — Patient Instructions (Signed)
Start Trelegy Ellipta 1 puff daily  We will schedule you for pulmonary function tests and call you with the results.

## 2020-03-18 NOTE — Progress Notes (Signed)
Synopsis: Referred in 01/2020 for COPD by Kelton Pillar, MD  Subjective:   PATIENT ID: Pamela Roth GENDER: female DOB: [birth year], MRN: [id number]   HPI  Chief Complaint  Patient presents with  . Follow-up    COPD F/U. Patient reports her oxygen is doing well. Insurance does not cover [employer].    Pamela Roth is a 70 year old woman, former smoker with COPD/Emphysema who returns to pulmonary clinic for COPD and chronic hypoxemic respiratory failure after covid 19 pneumonia in 10/2019 on supplemental oxygen.   She is doing better since last visit. She reports she is not using oxygen at rest now. She does report that she is dropping to 79% when walking to her mailbox with some dyspnea. She also reports dropping into the 70s when working with her home physical therapist but at those times she is not complaining of dyspnea, feeling light headed or dizzy.   She does find relief from breztri inhaler but it is not covered by her insurance and is $700.   On simple walk in clinic, she remained above 90% oxygen saturation after 3 laps.   OV 01/13/20 She received 1 dose of covid 19 vaccine a week prior to testing positive and being admitted to the hospital 10/28/19 to 11/02/19. She was discharged on 4L of oxygen and has been weaned down to 1L at rest and 2-3L with exertion. She has a home nurse that has been monitoring her oxygen needs and performing ambulatory O2 monitoring. Today she was able to walk 553ft with an oxygen desaturation to 84% on 1L. Previously she was able to walk 370ft on 1L with oxygen desaturation to 80-85% on 12/29/19.   She has a significant smoking history but quit 14 years ago after her husband died of lung cancer and she was diagnosed with COPD. She has been on symbicort inhaler since with solid maintenance of her respiratory symptoms. Prior to covid she did not have issues ambulating or taking care of her activities of daily living.   Review of CTA Chest scan from  10/2019 does not show pulmonary emboli. There is emphysematous changes with interstitial thickening consistent with fibrosis in the periphery mainly in the mid and upper lung fields.   Past Medical History:  Diagnosis Date  . Anxiety   . COPD (chronic obstructive pulmonary disease) ([place])   . Depression   . Hyperlipidemia   . Hypertension   . Multinodular goiter      Family History  Problem Relation Age of Onset  . Schizophrenia Mother   . Dementia Mother      Social History   Socioeconomic History  . Marital status: Widowed    Spouse name: Not on file  . Number of children: 1  . Years of education: some college  . Highest education level: Some college, no degree  Occupational History  . Not on file  Tobacco Use  . Smoking status: Former Smoker    Quit date: 01/09/2008    Years since quitting: 12.1  . Smokeless tobacco: Never Used  Vaping Use  . Vaping Use: Never used  Substance and Sexual Activity  . Alcohol use: No    Alcohol/week: 0.0 standard drinks  . Drug use: No  . Sexual activity: Not Currently  Other Topics Concern  . Not on file  Social History Narrative   Lives at home alone   Right handed   Caffeine: drinks 3 cups daily   Social Determinants of Radio broadcast assistant  Strain: Not on file  Food Insecurity: Not on file  Transportation Needs: Not on file  Physical Activity: Not on file  Stress: Not on file  Social Connections: Not on file  Intimate Partner Violence: Not on file     No Known Allergies   Outpatient Medications Prior to Visit  Medication Sig Dispense Refill  . acetaminophen (TYLENOL) 500 MG tablet Take 1,000 mg by mouth every 6 (six) hours as needed (for hip pain or mild headaches).    Marland Kitchen albuterol (VENTOLIN HFA) 108 (90 Base) MCG/ACT inhaler Inhale 2 puffs into the lungs every 6 (six) hours as needed for wheezing or shortness of breath. 6.7 g 0  . Camphor-Menthol-Methyl Sal (SALONPAS EX) Place 1 patch onto the skin See admin  instructions. Apply 1 patch one to two times a day as needed for hip pain (remove old patch first)    . diclofenac Sodium (VOLTAREN) 1 % GEL Apply topically.    . DULoxetine (CYMBALTA) 30 MG capsule Take 30 mg by mouth daily.    Marland Kitchen losartan (COZAAR) 50 MG tablet Take 50 mg by mouth daily.    . meloxicam (MOBIC) 7.5 MG tablet Take 15 mg by mouth daily.    . Multiple Vitamins-Minerals (ONE-A-DAY PROACTIVE 65+) TABS Take 1 tablet by mouth daily with breakfast.    . naphazoline-glycerin (CLEAR EYES REDNESS) 0.012-0.2 % SOLN Place 1 drop into both eyes 4 (four) times daily as needed for eye irritation.    . simvastatin (ZOCOR) 20 MG tablet Take 20 mg by mouth daily.   5  . sodium chloride (OCEAN) 0.65 % SOLN nasal spray Place 1 spray into both nostrils as needed for congestion. 30 mL 0  . Budeson-Glycopyrrol-Formoterol (BREZTRI AEROSPHERE) 160-9-4.8 MCG/ACT AERO Inhale 2 puffs into the lungs 2 (two) times daily. 10.7 g 11   No facility-administered medications prior to visit.    Review of Systems  Constitutional: Negative for chills, fever, malaise/fatigue and weight loss.  HENT: Negative for congestion, sinus pain and sore throat.   Eyes: Negative.   Respiratory: Positive for shortness of breath. Negative for cough, hemoptysis, sputum production and wheezing.   Cardiovascular: Negative for chest pain, palpitations, orthopnea, claudication, leg swelling and PND.  Gastrointestinal: Negative for heartburn, nausea and vomiting.  Genitourinary: Negative.   Musculoskeletal: Negative for joint pain and myalgias.  Skin: Negative for rash.  Neurological: Negative for dizziness, weakness and headaches.  Endo/Heme/Allergies: Negative.   Psychiatric/Behavioral: Negative.    Objective:   Vitals:   03/18/20 0857  BP: 128/78  Pulse: 77  SpO2: 97%  Weight: 179 lb 9.6 oz (81.5 kg)  Height: 5\' 6"  (1.676 m)     Physical Exam Constitutional:      General: She is not in acute distress.     Appearance: She is normal weight. She is not ill-appearing.  HENT:     Head: Normocephalic and atraumatic.     Nose: Nose normal.     Mouth/Throat:     Mouth: Mucous membranes are moist.     Pharynx: Oropharynx is clear.  Eyes:     General: No scleral icterus.    Conjunctiva/sclera: Conjunctivae normal.     Pupils: Pupils are equal, round, and reactive to light.  Cardiovascular:     Rate and Rhythm: Normal rate and regular rhythm.     Pulses: Normal pulses.     Heart sounds: Normal heart sounds. No murmur heard.   Pulmonary:     Effort: Pulmonary effort is normal.  Breath sounds: Normal breath sounds. No wheezing, rhonchi or rales.  Abdominal:     General: Bowel sounds are normal.     Palpations: Abdomen is soft.  Musculoskeletal:     Right lower leg: No edema.     Left lower leg: No edema.  Lymphadenopathy:     Cervical: No cervical adenopathy.  Skin:    General: Skin is warm and dry.     Capillary Refill: Capillary refill takes less than 2 seconds.  Neurological:     General: No focal deficit present.     Mental Status: She is alert.  Psychiatric:        Mood and Affect: Mood normal.        Behavior: Behavior normal.        Thought Content: Thought content normal.        Judgment: Judgment normal.    CBC    Component Value Date/Time   WBC 8.8 11/02/2019 0513   RBC 4.52 11/02/2019 0513   HGB 13.7 11/02/2019 0513   HCT 40.1 11/02/2019 0513   PLT 229 11/02/2019 0513   MCV 88.7 11/02/2019 0513   MCH 30.3 11/02/2019 0513   MCHC 34.2 11/02/2019 0513   RDW 15.4 11/02/2019 0513   LYMPHSABS 1.0 11/02/2019 0513   MONOABS 0.3 11/02/2019 0513   EOSABS 0.0 11/02/2019 0513   BASOSABS 0.0 11/02/2019 0513   BMP Latest Ref Rng & Units 11/02/2019 11/01/2019 10/31/2019  Glucose 70 - 99 mg/dL 163(H) 151(H) 135(H)  BUN 8 - 23 mg/dL 24(H) 25(H) 26(H)  Creatinine 0.44 - 1.00 mg/dL 0.91 0.94 0.81  Sodium 135 - 145 mmol/L 137 139 138  Potassium 3.5 - 5.1 mmol/L 4.3 4.1 4.5   Chloride 98 - 111 mmol/L 106 105 109  CO2 22 - 32 mmol/L 24 25 23   Calcium 8.9 - 10.3 mg/dL 8.9 9.0 8.9   Chest imaging: CXR 11/11/19 The cardiomediastinal silhouette is unchanged and mildly enlarged in contour.Atherosclerotic calcifications of the aorta. No pleural effusion. No pneumothorax. Increased peripheral predominant coarse reticular and consolidative opacities. Visualized abdomen is unremarkable. Chronic mild wedging of a vertebral body at the thoracolumbar junction.  CTA Chest 10/28/2019 Cardiovascular: No filling defects in the pulmonary arteries to suggest pulmonary emboli. Aortic atherosclerosis and coronary artery calcifications. Heart is normal size. Aorta is normal caliber.  Mediastinum/Nodes: No mediastinal, hilar, or axillary adenopathy. Trachea and esophagus are unremarkable. Left thyroid nodule measuring up to 2.1 cm.  Lungs/Pleura: Emphysema. Interstitial thickening peripherally in the lungs most compatible with fibrosis. Dependent and bibasilar atelectasis. No effusions.  Upper Abdomen: Imaging into the upper abdomen demonstrates no acute findings.  Musculoskeletal: Chest wall soft tissues are unremarkable. No acute bony abnormality.  PFT: No flowsheet data found.     Assessment & Plan:   Chronic respiratory failure with hypoxia (HCC)  Pneumonia due to COVID-19 virus  Chronic obstructive pulmonary disease, unspecified COPD type ([place]) - Plan: Fluticasone-Umeclidin-Vilant (TRELEGY ELLIPTA) 100-62.5-25 MCG/INH AEPB  Discussion: Pamela Roth is a 70 year old woman, former smoker with COPD/Emphysema who returns to pulmonary clinic for COPD and chronic hypoxemic respiratory failure after covid 19 pneumonia in 10/2019 on supplemental oxygen.    Her respiratory status has improved significantly since last visit. We will change her from [place] to [employer] based on her insurance coverage.   We will schedule her for pulmonary function tests.    On simple walk today, she does not have desaturations below 90% after 3 laps of walking. We discussed how  her oximeter she has been using may not be truly accurate. For now she can continue supplemental oxygen with extended time periods of exertion or ambulation. Will consider 6 minute walk testing in the future to determine need for supplemental oxygen.   Follow up in 3 months.  Freda Jackson, MD [facility] Office: [phone]     Current Outpatient Medications:  .  acetaminophen (TYLENOL) 500 MG tablet, Take 1,000 mg by mouth every 6 (six) hours as needed (for hip pain or mild headaches)., Disp: , Rfl:  .  albuterol (VENTOLIN HFA) 108 (90 Base) MCG/ACT inhaler, Inhale 2 puffs into the lungs every 6 (six) hours as needed for wheezing or shortness of breath., Disp: 6.7 g, Rfl: 0 .  Camphor-Menthol-Methyl Sal (SALONPAS EX), Place 1 patch onto the skin See admin instructions. Apply 1 patch one to two times a day as needed for hip pain (remove old patch first), Disp: , Rfl:  .  diclofenac Sodium (VOLTAREN) 1 % GEL, Apply topically., Disp: , Rfl:  .  DULoxetine (CYMBALTA) 30 MG capsule, Take 30 mg by mouth daily., Disp: , Rfl:  .  Fluticasone-Umeclidin-Vilant (TRELEGY ELLIPTA) 100-62.5-25 MCG/INH AEPB, Inhale 1 puff into the lungs daily., Disp: 28 each, Rfl: 6 .  losartan (COZAAR) 50 MG tablet, Take 50 mg by mouth daily., Disp: , Rfl:  .  meloxicam (MOBIC) 7.5 MG tablet, Take 15 mg by mouth daily., Disp: , Rfl:  .  Multiple Vitamins-Minerals (ONE-A-DAY PROACTIVE 65+) TABS, Take 1 tablet by mouth daily with breakfast., Disp: , Rfl:  .  naphazoline-glycerin (CLEAR EYES REDNESS) 0.012-0.2 % SOLN, Place 1 drop into both eyes 4 (four) times daily as needed for eye irritation., Disp: , Rfl:  .  simvastatin (ZOCOR) 20 MG tablet, Take 20 mg by mouth daily. , Disp: , Rfl: 5 .  sodium chloride (OCEAN) 0.65 % SOLN nasal spray, Place 1 spray into both nostrils as needed for  congestion., Disp: 30 mL, Rfl: 0

## 2020-03-18 NOTE — Addendum Note (Signed)
Addended by: Vivia Ewing on: 03/18/2020 10:13 AM   Modules accepted: Orders

## 2020-03-18 NOTE — Telephone Encounter (Signed)
Pt just seen in clinic today and no cxr order  Looks like last cxr was 11/11/19  [facility] for the pt

## 2020-03-19 ENCOUNTER — Other Ambulatory Visit (HOSPITAL_COMMUNITY)
Admission: RE | Admit: 2020-03-19 | Discharge: 2020-03-19 | Disposition: A | Discharge status: Home / Self Care | Payer: Medicare Other | Source: Ambulatory Visit | Attending: Pulmonary Disease | Admitting: Pulmonary Disease

## 2020-03-19 DIAGNOSIS — Z20822 Contact with and (suspected) exposure to covid-19: Secondary | ICD-10-CM | POA: Diagnosis not present

## 2020-03-19 DIAGNOSIS — Z01812 Encounter for preprocedural laboratory examination: Secondary | ICD-10-CM | POA: Diagnosis not present

## 2020-03-19 LAB — SARS CORONAVIRUS 2 (TAT 6-24 HRS): SARS Coronavirus 2: NEGATIVE

## 2020-03-22 ENCOUNTER — Ambulatory Visit (INDEPENDENT_AMBULATORY_CARE_PROVIDER_SITE_OTHER): Payer: Medicare Other | Admitting: Pulmonary Disease

## 2020-03-22 ENCOUNTER — Other Ambulatory Visit: Payer: Self-pay

## 2020-03-22 DIAGNOSIS — J449 Chronic obstructive pulmonary disease, unspecified: Secondary | ICD-10-CM

## 2020-03-22 DIAGNOSIS — J1282 Pneumonia due to coronavirus disease 2019: Secondary | ICD-10-CM

## 2020-03-22 DIAGNOSIS — U071 COVID-19: Secondary | ICD-10-CM

## 2020-03-22 LAB — PULMONARY FUNCTION TEST
DL/VA % pred: 54 %
DL/VA: 2.21 ml/min/mmHg/L
DLCO cor % pred: 46 %
DLCO cor: 9.64 ml/min/mmHg
DLCO unc % pred: 46 %
DLCO unc: 9.64 ml/min/mmHg
FEF 25-75 Post: 1.65 L/sec
FEF 25-75 Pre: 2.07 L/sec
FEF2575-%Change-Post: -20 %
FEF2575-%Pred-Post: 91 %
FEF2575-%Pred-Pre: 114 %
FEV1-%Change-Post: -4 %
FEV1-%Pred-Post: 118 %
FEV1-%Pred-Pre: 124 %
FEV1-Post: 2.37 L
FEV1-Pre: 2.48 L
FEV1FVC-%Change-Post: -4 %
FEV1FVC-%Pred-Pre: 98 %
FEV6-%Change-Post: 0 %
FEV6-%Pred-Post: 131 %
FEV6-%Pred-Pre: 130 %
FEV6-Post: 3.25 L
FEV6-Pre: 3.23 L
FEV6FVC-%Pred-Post: 103 %
FEV6FVC-%Pred-Pre: 103 %
FVC-%Change-Post: 0 %
FVC-%Pred-Post: 126 %
FVC-%Pred-Pre: 126 %
FVC-Post: 3.25 L
FVC-Pre: 3.25 L
Post FEV1/FVC ratio: 73 %
Post FEV6/FVC ratio: 100 %
Pre FEV1/FVC ratio: 76 %
Pre FEV6/FVC Ratio: 100 %
RV % pred: 40 %
RV: 0.93 L
TLC % pred: 77 %
TLC: 4.15 L

## 2020-03-22 NOTE — Patient Instructions (Signed)
Pulmonary function test performed today. 

## 2020-03-22 NOTE — Progress Notes (Signed)
Full PFT performed today. °

## 2020-03-24 ENCOUNTER — Telehealth (HOSPITAL_COMMUNITY): Payer: Self-pay | Admitting: *Deleted

## 2020-03-28 ENCOUNTER — Other Ambulatory Visit: Payer: Self-pay

## 2020-03-28 ENCOUNTER — Encounter (HOSPITAL_COMMUNITY)
Admission: RE | Admit: 2020-03-28 | Discharge: 2020-03-28 | Disposition: A | Discharge status: Home / Self Care | Payer: Medicare Other | Source: Ambulatory Visit | Attending: Pulmonary Disease | Admitting: Pulmonary Disease

## 2020-03-28 VITALS — BP 144/80 | HR 71 | Ht 65.0 in | Wt 175.3 lb

## 2020-03-28 DIAGNOSIS — J439 Emphysema, unspecified: Secondary | ICD-10-CM | POA: Diagnosis not present

## 2020-03-28 NOTE — Progress Notes (Signed)
Pamela Roth 70 y.o. female Pulmonary Rehab Orientation Note This patient who was referred to Pulmonary rehab by Dr. Erin Fulling with the diagnosis of emphysema arrived today in Cardiac and Pulmonary Rehab. She arrived ambulatory with normal gait. She does carry portable oxygen. Per pt, she uses oxygen intermittently, mainly when walking longer distances. Color good, skin warm and dry. Patient is oriented to time and place. Patient's medical history, psychosocial health, and medications reviewed. Psychosocial assessment reveals pt lives alone. 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 Pt hobbies include writing and reading. Pt reports her stress level is low. Areas of stress/anxiety include learning to deal with her lung issues post covid which she was hospitalized 10/2019.  Pt does not exhibit signs of depression.  PHQ2/9 score 0/1. Pt shows good  coping skills with positive outlook . Will continue to monitor and evaluate progress toward psychosocial goal(s) of continued mental well being while participating in pulmonary rehab. Physical assessment reveals heart rate is normal, breath sounds clear to auscultation, no wheezes, rales, or rhonchi. Grip strength equal, strong. Posterior tibial pulses present and 3+ without peripheral edema. Patient reports she does take medications as prescribed. Patient states she follows a Regular diet. The patient reports no specific efforts to gain or lose weight.. Patient's weight will be monitored closely. Demonstration and practice of PLB using pulse oximeter. Patient able to return demonstration satisfactorily. Safety and hand hygiene in the exercise area reviewed with patient. Patient voices understanding of the information reviewed. Department expectations discussed with patient and achievable goals were set. The patient shows enthusiasm about attending the program and we look forward to working with this nice lady. The patient  completed a 6 min walk test on today, 03/28/2020 and to begin exercise on Tuesday, 04/05/2020 in the 11 am exercise slot.  [id number]

## 2020-03-28 NOTE — Progress Notes (Signed)
Pulmonary Individual Treatment Plan  Patient Details  Name: Pamela Roth MRN: [id number] Date of Birth: 1950-11-12 Referring Provider:   April Manson Pulmonary Rehab Walk Test from 03/28/2020 in [place]  Referring Provider Dr. Erin Fulling      Initial Encounter Date:  Flowsheet Row Pulmonary Rehab Walk Test from 03/28/2020 in [place]  Date 03/28/20      Visit Diagnosis: Pulmonary emphysema, unspecified emphysema type ([place])  Patient's Home Medications on Admission:   Current Outpatient Medications:  .  diclofenac Sodium (VOLTAREN) 1 % GEL, Apply topically., Disp: , Rfl:  .  DULoxetine (CYMBALTA) 30 MG capsule, Take 30 mg by mouth daily., Disp: , Rfl:  .  Fluticasone-Umeclidin-Vilant (TRELEGY ELLIPTA) 100-62.5-25 MCG/INH AEPB, Inhale 1 puff into the lungs daily., Disp: 28 each, Rfl: 6 .  losartan (COZAAR) 50 MG tablet, Take 50 mg by mouth daily., Disp: , Rfl:  .  Multiple Vitamins-Minerals (ONE-A-DAY PROACTIVE 65+) TABS, Take 1 tablet by mouth daily with breakfast., Disp: , Rfl:  .  simvastatin (ZOCOR) 20 MG tablet, Take 20 mg by mouth daily. , Disp: , Rfl: 5 .  acetaminophen (TYLENOL) 500 MG tablet, Take 1,000 mg by mouth every 6 (six) hours as needed (for hip pain or mild headaches). (Patient not taking: Reported on 03/28/2020), Disp: , Rfl:  .  albuterol (VENTOLIN HFA) 108 (90 Base) MCG/ACT inhaler, Inhale 2 puffs into the lungs every 6 (six) hours as needed for wheezing or shortness of breath. (Patient not taking: Reported on 03/28/2020), Disp: 6.7 g, Rfl: 0 .  Camphor-Menthol-Methyl Sal (SALONPAS EX), Place 1 patch onto the skin See admin instructions. Apply 1 patch one to two times a day as needed for hip pain (remove old patch first) (Patient not taking: Reported on 03/28/2020), Disp: , Rfl:  .  Fluticasone-Umeclidin-Vilant (TRELEGY ELLIPTA) 100-62.5-25 MCG/INH AEPB, Inhale 1 puff into the lungs daily., Disp: 60  each, Rfl: 0 .  meloxicam (MOBIC) 7.5 MG tablet, Take 15 mg by mouth daily. (Patient not taking: Reported on 03/28/2020), Disp: , Rfl:  .  naphazoline-glycerin (CLEAR EYES REDNESS) 0.012-0.2 % SOLN, Place 1 drop into both eyes 4 (four) times daily as needed for eye irritation. (Patient not taking: Reported on 03/28/2020), Disp: , Rfl:  .  sodium chloride (OCEAN) 0.65 % SOLN nasal spray, Place 1 spray into both nostrils as needed for congestion. (Patient not taking: Reported on 03/28/2020), Disp: 30 mL, Rfl: 0  Past Medical History: Past Medical History:  Diagnosis Date  . Anxiety   . COPD (chronic obstructive pulmonary disease) ([place])   . Depression   . Hyperlipidemia   . Hypertension   . Multinodular goiter     Tobacco Use: Social History   Tobacco Use  Smoking Status Former Smoker  . Quit date: 01/09/2008  . Years since quitting: 12.2  Smokeless Tobacco Never Used    Labs: Recent Review Scientist, physiological    Labs for ITP Cardiac and Pulmonary Rehab Latest Ref Rng & Units 10/27/2006 10/28/2006 10/28/2019   Cholestrol - - 167 ... -   LDLCALC - - 105 .Marland Kitchen.(H) -   HDL - - 57 -   Trlycerides <150 mg/dL - 25 69   PHART - 7.288(L) 7.387 -   PCO2ART - 46.7(H) 44.2 -   HCO3 - 21.6 26.2(H) -   TCO2 - 19.6 23.5 -   ACIDBASEDEF - 4.7(H) - -   O2SAT - 97.4 98.1 -  Capillary Blood Glucose: No results found for: GLUCAP   Pulmonary Assessment Scores:  Pulmonary Assessment Scores    Row Name 03/28/20 1201 03/28/20 1551       ADL UCSD   ADL Phase Entry --    SOB Score total 31 --         CAT Score   CAT Score 13 --         mMRC Score   mMRC Score -- 1          UCSD: Self-administered rating of dyspnea associated with activities of daily living (ADLs) 6-point scale (0 = "not at all" to 5 = "maximal or unable to do because of breathlessness")  Scoring Scores range from 0 to 120.  Minimally important difference is 5 units  CAT: CAT can identify the health impairment of  COPD patients and is better correlated with disease progression.  CAT has a scoring range of zero to 40. The CAT score is classified into four groups of low (less than 10), medium (10 - 20), high (21-30) and very high (31-40) based on the impact level of disease on health status. A CAT score over 10 suggests significant symptoms.  A worsening CAT score could be explained by an exacerbation, poor medication adherence, poor inhaler technique, or progression of COPD or comorbid conditions.  CAT MCID is 2 points  mMRC: mMRC (Modified Medical Research Council) Dyspnea Scale is used to assess the degree of baseline functional disability in patients of respiratory disease due to dyspnea. No minimal important difference is established. A decrease in score of 1 point or greater is considered a positive change.   Pulmonary Function Assessment:  Pulmonary Function Assessment - 03/28/20 1214      Breath   Bilateral Breath Sounds Clear    Shortness of Breath Yes;Limiting activity           Exercise Target Goals: Exercise Program Goal: Individual exercise prescription set using results from initial 6 min walk test and THRR while considering  patient's activity barriers and safety.   Exercise Prescription Goal: Initial exercise prescription builds to 30-45 minutes a day of aerobic activity, 2-3 days per week.  Home exercise guidelines will be given to patient during program as part of exercise prescription that the participant will acknowledge.  Activity Barriers & Risk Stratification:  Activity Barriers & Cardiac Risk Stratification - 03/28/20 1139      Activity Barriers & Cardiac Risk Stratification   Activity Barriers Arthritis;Back Problems;Right Hip Replacement;Joint Problems;Deconditioning;Muscular Weakness;Shortness of Breath           6 Minute Walk:  6 Minute Walk    Row Name 03/28/20 1439         6 Minute Walk   Phase Initial     Distance 1356 feet     Walk Time 6 minutes      # of Rest Breaks 0     MPH 2.57     METS 3.35     RPE 9     Perceived Dyspnea  0     VO2 Peak 11.74     Symptoms No     Resting HR 75 bpm     Resting BP 148/88     Resting Oxygen Saturation  93 %     Exercise Oxygen Saturation  during 6 min walk 86 %     Max Ex. HR 99 bpm     Max Ex. BP 152/86     2 Minute Post BP 140/84  Interval HR   1 Minute HR 90     2 Minute HR 99     3 Minute HR 98     4 Minute HR 95     5 Minute HR 91     6 Minute HR 97     2 Minute Post HR 67     Interval Heart Rate? Yes           Interval Oxygen   Interval Oxygen? Yes     Baseline Oxygen Saturation % 93 %     1 Minute Oxygen Saturation % 92 %     1 Minute Liters of Oxygen 0 L     2 Minute Oxygen Saturation % 89 %     2 Minute Liters of Oxygen 0 L     3 Minute Oxygen Saturation % 87 %     3 Minute Liters of Oxygen 1 L     4 Minute Oxygen Saturation % 91 %     4 Minute Liters of Oxygen 1 L     5 Minute Oxygen Saturation % 89 %     5 Minute Liters of Oxygen 1 L     6 Minute Oxygen Saturation % 86 %     6 Minute Liters of Oxygen 1 L     2 Minute Post Oxygen Saturation % 98 %     2 Minute Post Liters of Oxygen 1 L            Oxygen Initial Assessment:  Oxygen Initial Assessment - 03/28/20 1213      Home Oxygen   Home Oxygen Device Portable Concentrator;E-Tanks    Sleep Oxygen Prescription None    Home Exercise Oxygen Prescription Continuous    Liters per minute 1    Home Resting Oxygen Prescription None    Compliance with Home Oxygen Use Yes      Initial 6 min Walk   Oxygen Used Continuous    Liters per minute 1      Program Oxygen Prescription   Program Oxygen Prescription Continuous    Liters per minute -1    Comments Pt may need 2L of oxygen for walking longer distances. Started her on RA for 6 minute walk test, she desaturated to 86/87% right before minute 3. Put her on 1L and she started to drop again below 87% around minute 5 and 6. Will monitor oxygen needs with  specific exercises.      Intervention   Short Term Goals To learn and exhibit compliance with exercise, home and travel O2 prescription;To learn and understand importance of monitoring SPO2 with pulse oximeter and demonstrate accurate use of the pulse oximeter.;To learn and understand importance of maintaining oxygen saturations>88%;To learn and demonstrate proper pursed lip breathing techniques or other breathing techniques.;To learn and demonstrate proper use of respiratory medications    Long  Term Goals Maintenance of O2 saturations>88%;Exhibits compliance with exercise, home and travel O2 prescription;Verbalizes importance of monitoring SPO2 with pulse oximeter and return demonstration;Exhibits proper breathing techniques, such as pursed lip breathing or other method taught during program session;Compliance with respiratory medication;Demonstrates proper use of MDI's           Oxygen Re-Evaluation:   Oxygen Discharge (Final Oxygen Re-Evaluation):   Initial Exercise Prescription:  Initial Exercise Prescription - 03/28/20 1400      Date of Initial Exercise RX and Referring Provider   Date 03/28/20    Referring Provider Dr. Erin Fulling    Expected Discharge  Date 06/02/20      Treadmill   MPH 2.2    Grade 0    Minutes 15      NuStep   Level 1    SPM 80    Minutes 15      Prescription Details   Frequency (times per week) 2    Duration Progress to 30 minutes of continuous aerobic without signs/symptoms of physical distress      Intensity   THRR 40-80% of Max Heartrate 60-119    Ratings of Perceived Exertion 11-13    Perceived Dyspnea 0-4      Progression   Progression Continue to progress workloads to maintain intensity without signs/symptoms of physical distress.      Resistance Training   Training Prescription Yes    Weight orange bands    Reps 10-15           Perform Capillary Blood Glucose checks as needed.  Exercise Prescription Changes:   Exercise  Comments:   Exercise Goals and Review:  Exercise Goals    Row Name 03/28/20 1435             Exercise Goals   Increase Physical Activity Yes       Intervention Provide advice, education, support and counseling about physical activity/exercise needs.;Develop an individualized exercise prescription for aerobic and resistive training based on initial evaluation findings, risk stratification, comorbidities and participant's personal goals.       Expected Outcomes Short Term: Attend rehab on a regular basis to increase amount of physical activity.;Long Term: Add in home exercise to make exercise part of routine and to increase amount of physical activity.;Long Term: Exercising regularly at least 3-5 days a week.       Increase Strength and Stamina Yes       Intervention Provide advice, education, support and counseling about physical activity/exercise needs.;Develop an individualized exercise prescription for aerobic and resistive training based on initial evaluation findings, risk stratification, comorbidities and participant's personal goals.       Expected Outcomes Short Term: Increase workloads from initial exercise prescription for resistance, speed, and METs.;Short Term: Perform resistance training exercises routinely during rehab and add in resistance training at home;Long Term: Improve cardiorespiratory fitness, muscular endurance and strength as measured by increased METs and functional capacity (6MWT)       Able to understand and use rate of perceived exertion (RPE) scale Yes       Intervention Provide education and explanation on how to use RPE scale       Expected Outcomes Short Term: Able to use RPE daily in rehab to express subjective intensity level;Long Term:  Able to use RPE to guide intensity level when exercising independently       Able to understand and use Dyspnea scale Yes       Intervention Provide education and explanation on how to use Dyspnea scale       Expected Outcomes  Short Term: Able to use Dyspnea scale daily in rehab to express subjective sense of shortness of breath during exertion;Long Term: Able to use Dyspnea scale to guide intensity level when exercising independently       Knowledge and understanding of Target Heart Rate Range (THRR) Yes       Intervention Provide education and explanation of THRR including how the numbers were predicted and where they are located for reference       Expected Outcomes Short Term: Able to state/look up THRR;Long Term: Able to use THRR  to govern intensity when exercising independently;Short Term: Able to use daily as guideline for intensity in rehab       Understanding of Exercise Prescription Yes       Intervention Provide education, explanation, and written materials on patient's individual exercise prescription       Expected Outcomes Short Term: Able to explain program exercise prescription;Long Term: Able to explain home exercise prescription to exercise independently              Exercise Goals Re-Evaluation :   Discharge Exercise Prescription (Final Exercise Prescription Changes):   Nutrition:  Target Goals: Understanding of nutrition guidelines, daily intake of sodium 1500mg , cholesterol 200mg , calories 30% from fat and 7% or less from saturated fats, daily to have 5 or more servings of fruits and vegetables.  Biometrics:  Pre Biometrics - 03/28/20 1140      Pre Biometrics   Grip Strength 30.5 kg            Nutrition Therapy Plan and Nutrition Goals:   Nutrition Assessments:  MEDIFICTS Score Key:  ?70 Need to make dietary changes   40-70 Heart Healthy Diet  ? 40 Therapeutic Level Cholesterol Diet   Picture Your Plate Scores:  <46 Unhealthy dietary pattern with much room for improvement.  41-50 Dietary pattern unlikely to meet recommendations for good health and room for improvement.  51-60 More healthful dietary pattern, with some room for improvement.   >60 Healthy dietary  pattern, although there may be some specific behaviors that could be improved.    Nutrition Goals Re-Evaluation:   Nutrition Goals Discharge (Final Nutrition Goals Re-Evaluation):   Psychosocial: Target Goals: Acknowledge presence or absence of significant depression and/or stress, maximize coping skills, provide positive support system. Participant is able to verbalize types and ability to use techniques and skills needed for reducing stress and depression.  Initial Review & Psychosocial Screening:  Initial Psych Review & Screening - 03/28/20 1202      Initial Review   Current issues with None Identified      Family Dynamics   Good Support System? Yes      Barriers   Psychosocial barriers to participate in program There are no identifiable barriers or psychosocial needs.      Screening Interventions   Interventions Encouraged to exercise           Quality of Life Scores:  Scores of 19 and below usually indicate a poorer quality of life in these areas.  A difference of  2-3 points is a clinically meaningful difference.  A difference of 2-3 points in the total score of the Quality of Life Index has been associated with significant improvement in overall quality of life, self-image, physical symptoms, and general health in studies assessing change in quality of life.  PHQ-9: Recent Review Flowsheet Data    Depression screen [facility] 2/9 03/28/2020   Decreased Interest 0   Down, Depressed, Hopeless 0   PHQ - 2 Score 0   Altered sleeping 0   Tired, decreased energy 1   Change in appetite 0   Feeling bad or failure about yourself  0   Trouble concentrating 0   Moving slowly or fidgety/restless 0   Suicidal thoughts 0   PHQ-9 Score 1   Difficult doing work/chores Not difficult at all     Interpretation of Total Score  Total Score Depression Severity:  1-4 = Minimal depression, 5-9 = Mild depression, 10-14 = Moderate depression, 15-19 = Moderately severe depression, 20-27 =  Severe depression   Psychosocial Evaluation and Intervention:  Psychosocial Evaluation - 03/28/20 1211      Psychosocial Evaluation & Interventions   Interventions Encouraged to exercise with the program and follow exercise prescription    Comments No concerns identified    Expected Outcomes To continue to have no psychosocial concerns while participating in pulmonary rehab.    Continue Psychosocial Services  No Follow up required           Psychosocial Re-Evaluation:   Psychosocial Discharge (Final Psychosocial Re-Evaluation):   Education: Education Goals: Education classes will be provided on a weekly basis, covering required topics. Participant will state understanding/return demonstration of topics presented.  Learning Barriers/Preferences:  Learning Barriers/Preferences - 03/28/20 1213      Learning Barriers/Preferences   Learning Barriers None    Learning Preferences Computer/Internet;Written Material           Education Topics: Risk Factor Reduction:  -Group instruction that is supported by a PowerPoint presentation. Instructor discusses the definition of a risk factor, different risk factors for pulmonary disease, and how the heart and lungs work together.     Nutrition for Pulmonary Patient:  -Group instruction provided by PowerPoint slides, verbal discussion, and written materials to support subject matter. The instructor gives an explanation and review of healthy diet recommendations, which includes a discussion on weight management, recommendations for fruit and vegetable consumption, as well as protein, fluid, caffeine, fiber, sodium, sugar, and alcohol. Tips for eating when patients are short of breath are discussed.   Pursed Lip Breathing:  -Group instruction that is supported by demonstration and informational handouts. Instructor discusses the benefits of pursed lip and diaphragmatic breathing and detailed demonstration on how to preform both.      Oxygen Safety:  -Group instruction provided by PowerPoint, verbal discussion, and written material to support subject matter. There is an overview of "What is Oxygen" and "Why do we need it".  Instructor also reviews how to create a safe environment for oxygen use, the importance of using oxygen as prescribed, and the risks of noncompliance. There is a brief discussion on traveling with oxygen and resources the patient may utilize.   Oxygen Equipment:  -Group instruction provided by [facility] Staff utilizing handouts, written materials, and equipment demonstrations.   Signs and Symptoms:  -Group instruction provided by written material and verbal discussion to support subject matter. Warning signs and symptoms of infection, stroke, and heart attack are reviewed and when to call the physician/911 reinforced. Tips for preventing the spread of infection discussed.   Advanced Directives:  -Group instruction provided by verbal instruction and written material to support subject matter. Instructor reviews Advanced Directive laws and proper instruction for filling out document.   Pulmonary Video:  -Group video education that reviews the importance of medication and oxygen compliance, exercise, good nutrition, pulmonary hygiene, and pursed lip and diaphragmatic breathing for the pulmonary patient.   Exercise for the Pulmonary Patient:  -Group instruction that is supported by a PowerPoint presentation. Instructor discusses benefits of exercise, core components of exercise, frequency, duration, and intensity of an exercise routine, importance of utilizing pulse oximetry during exercise, safety while exercising, and options of places to exercise outside of rehab.     Pulmonary Medications:  -Verbally interactive group education provided by instructor with focus on inhaled medications and proper administration.   Anatomy and Physiology of the Respiratory System and Intimacy:  -Group  instruction provided by PowerPoint, verbal discussion, and written material to support subject matter. Instructor  reviews respiratory cycle and anatomical components of the respiratory system and their functions. Instructor also reviews differences in obstructive and restrictive respiratory diseases with examples of each. Intimacy, Sex, and Sexuality differences are reviewed with a discussion on how relationships can change when diagnosed with pulmonary disease. Common sexual concerns are reviewed.   MD DAY -A group question and answer session with a medical doctor that allows participants to ask questions that relate to their pulmonary disease state.   OTHER EDUCATION -Group or individual verbal, written, or video instructions that support the educational goals of the pulmonary rehab program.   Holiday Eating Survival Tips:  -Group instruction provided by PowerPoint slides, verbal discussion, and written materials to support subject matter. The instructor gives patients tips, tricks, and techniques to help them not only survive but enjoy the holidays despite the onslaught of food that accompanies the holidays.   Knowledge Questionnaire Score:  Knowledge Questionnaire Score - 03/28/20 1205      Knowledge Questionnaire Score   Pre Score 16/18           Core Components/Risk Factors/Patient Goals at Admission:  Personal Goals and Risk Factors at Admission - 03/28/20 1205      Core Components/Risk Factors/Patient Goals on Admission   Improve shortness of breath with ADL's Yes    Intervention Provide education, individualized exercise plan and daily activity instruction to help decrease symptoms of SOB with activities of daily living.    Expected Outcomes Short Term: Improve cardiorespiratory fitness to achieve a reduction of symptoms when performing ADLs;Long Term: Be able to perform more ADLs without symptoms or delay the onset of symptoms           Core Components/Risk  Factors/Patient Goals Review:   Goals and Risk Factor Review    Row Name 03/28/20 1205             Core Components/Risk Factors/Patient Goals Review   Personal Goals Review Develop more efficient breathing techniques such as purse lipped breathing and diaphragmatic breathing and practicing self-pacing with activity.;Increase knowledge of respiratory medications and ability to use respiratory devices properly.;Improve shortness of breath with ADL's              Core Components/Risk Factors/Patient Goals at Discharge (Final Review):   Goals and Risk Factor Review - 03/28/20 1205      Core Components/Risk Factors/Patient Goals Review   Personal Goals Review Develop more efficient breathing techniques such as purse lipped breathing and diaphragmatic breathing and practicing self-pacing with activity.;Increase knowledge of respiratory medications and ability to use respiratory devices properly.;Improve shortness of breath with ADL's           ITP Comments:   Comments:

## 2020-03-28 NOTE — Progress Notes (Deleted)
Pulmonary Individual Treatment Plan  Patient Details  Name: Pamela Roth MRN: [id number] Date of Birth: 17-Nov-1950 Referring Provider:   April Manson Pulmonary Rehab Walk Test from 03/28/2020 in [place]  Referring Provider Dr. Erin Fulling      Initial Encounter Date:  Flowsheet Row Pulmonary Rehab Walk Test from 03/28/2020 in [facility]  Date 03/28/20      Visit Diagnosis: Pulmonary emphysema, unspecified emphysema type ([place])  Patient's Home Medications on Admission:   Current Outpatient Medications:  .  diclofenac Sodium (VOLTAREN) 1 % GEL, Apply topically., Disp: , Rfl:  .  DULoxetine (CYMBALTA) 30 MG capsule, Take 30 mg by mouth daily., Disp: , Rfl:  .  Fluticasone-Umeclidin-Vilant (TRELEGY ELLIPTA) 100-62.5-25 MCG/INH AEPB, Inhale 1 puff into the lungs daily., Disp: 28 each, Rfl: 6 .  losartan (COZAAR) 50 MG tablet, Take 50 mg by mouth daily., Disp: , Rfl:  .  Multiple Vitamins-Minerals (ONE-A-DAY PROACTIVE 65+) TABS, Take 1 tablet by mouth daily with breakfast., Disp: , Rfl:  .  simvastatin (ZOCOR) 20 MG tablet, Take 20 mg by mouth daily. , Disp: , Rfl: 5 .  acetaminophen (TYLENOL) 500 MG tablet, Take 1,000 mg by mouth every 6 (six) hours as needed (for hip pain or mild headaches). (Patient not taking: Reported on 03/28/2020), Disp: , Rfl:  .  albuterol (VENTOLIN HFA) 108 (90 Base) MCG/ACT inhaler, Inhale 2 puffs into the lungs every 6 (six) hours as needed for wheezing or shortness of breath. (Patient not taking: Reported on 03/28/2020), Disp: 6.7 g, Rfl: 0 .  Camphor-Menthol-Methyl Sal (SALONPAS EX), Place 1 patch onto the skin See admin instructions. Apply 1 patch one to two times a day as needed for hip pain (remove old patch first) (Patient not taking: Reported on 03/28/2020), Disp: , Rfl:  .  Fluticasone-Umeclidin-Vilant (TRELEGY ELLIPTA) 100-62.5-25 MCG/INH AEPB, Inhale 1 puff into the lungs daily., Disp: 60  each, Rfl: 0 .  meloxicam (MOBIC) 7.5 MG tablet, Take 15 mg by mouth daily. (Patient not taking: Reported on 03/28/2020), Disp: , Rfl:  .  naphazoline-glycerin (CLEAR EYES REDNESS) 0.012-0.2 % SOLN, Place 1 drop into both eyes 4 (four) times daily as needed for eye irritation. (Patient not taking: Reported on 03/28/2020), Disp: , Rfl:  .  sodium chloride (OCEAN) 0.65 % SOLN nasal spray, Place 1 spray into both nostrils as needed for congestion. (Patient not taking: Reported on 03/28/2020), Disp: 30 mL, Rfl: 0  Past Medical History: Past Medical History:  Diagnosis Date  . Anxiety   . COPD (chronic obstructive pulmonary disease) ([place])   . Depression   . Hyperlipidemia   . Hypertension   . Multinodular goiter     Tobacco Use: Social History   Tobacco Use  Smoking Status Former Smoker  . Quit date: 01/09/2008  . Years since quitting: 12.2  Smokeless Tobacco Never Used    Labs: Recent Review Scientist, physiological    Labs for ITP Cardiac and Pulmonary Rehab Latest Ref Rng & Units 10/27/2006 10/28/2006 10/28/2019   Cholestrol - - 167 ... -   LDLCALC - - 105 .Marland Kitchen.(H) -   HDL - - 57 -   Trlycerides <150 mg/dL - 25 69   PHART - 7.288(L) 7.387 -   PCO2ART - 46.7(H) 44.2 -   HCO3 - 21.6 26.2(H) -   TCO2 - 19.6 23.5 -   ACIDBASEDEF - 4.7(H) - -   O2SAT - 97.4 98.1 -  Capillary Blood Glucose: No results found for: GLUCAP   Pulmonary Assessment Scores:  Pulmonary Assessment Scores    Row Name 03/28/20 1201 03/28/20 1551       ADL UCSD   ADL Phase Entry -    SOB Score total 31 -         CAT Score   CAT Score 13 -         mMRC Score   mMRC Score - 1          UCSD: Self-administered rating of dyspnea associated with activities of daily living (ADLs) 6-point scale (0 = "not at all" to 5 = "maximal or unable to do because of breathlessness")  Scoring Scores range from 0 to 120.  Minimally important difference is 5 units  CAT: CAT can identify the health impairment of COPD  patients and is better correlated with disease progression.  CAT has a scoring range of zero to 40. The CAT score is classified into four groups of low (less than 10), medium (10 - 20), high (21-30) and very high (31-40) based on the impact level of disease on health status. A CAT score over 10 suggests significant symptoms.  A worsening CAT score could be explained by an exacerbation, poor medication adherence, poor inhaler technique, or progression of COPD or comorbid conditions.  CAT MCID is 2 points  mMRC: mMRC (Modified Medical Research Council) Dyspnea Scale is used to assess the degree of baseline functional disability in patients of respiratory disease due to dyspnea. No minimal important difference is established. A decrease in score of 1 point or greater is considered a positive change.   Pulmonary Function Assessment:  Pulmonary Function Assessment - 03/28/20 1214      Breath   Bilateral Breath Sounds Clear    Shortness of Breath Yes;Limiting activity           Exercise Target Goals: Exercise Program Goal: Individual exercise prescription set using results from initial 6 min walk test and THRR while considering  patient's activity barriers and safety.   Exercise Prescription Goal: Initial exercise prescription builds to 30-45 minutes a day of aerobic activity, 2-3 days per week.  Home exercise guidelines will be given to patient during program as part of exercise prescription that the participant will acknowledge.  Activity Barriers & Risk Stratification:  Activity Barriers & Cardiac Risk Stratification - 03/28/20 1139      Activity Barriers & Cardiac Risk Stratification   Activity Barriers Arthritis;Back Problems;Right Hip Replacement;Joint Problems;Deconditioning;Muscular Weakness;Shortness of Breath           6 Minute Walk:  6 Minute Walk    Row Name 03/28/20 1439         6 Minute Walk   Phase Initial     Distance 1356 feet     Walk Time 6 minutes     # of  Rest Breaks 0     MPH 2.57     METS 3.35     RPE 9     Perceived Dyspnea  0     VO2 Peak 11.74     Symptoms No     Resting HR 75 bpm     Resting BP 148/88     Resting Oxygen Saturation  93 %     Exercise Oxygen Saturation  during 6 min walk 86 %     Max Ex. HR 99 bpm     Max Ex. BP 152/86     2 Minute Post BP 140/84  Interval HR   1 Minute HR 90     2 Minute HR 99     3 Minute HR 98     4 Minute HR 95     5 Minute HR 91     6 Minute HR 97     2 Minute Post HR 67     Interval Heart Rate? Yes           Interval Oxygen   Interval Oxygen? Yes     Baseline Oxygen Saturation % 93 %     1 Minute Oxygen Saturation % 92 %     1 Minute Liters of Oxygen 0 L     2 Minute Oxygen Saturation % 89 %     2 Minute Liters of Oxygen 0 L     3 Minute Oxygen Saturation % 87 %     3 Minute Liters of Oxygen 1 L     4 Minute Oxygen Saturation % 91 %     4 Minute Liters of Oxygen 1 L     5 Minute Oxygen Saturation % 89 %     5 Minute Liters of Oxygen 1 L     6 Minute Oxygen Saturation % 86 %     6 Minute Liters of Oxygen 1 L     2 Minute Post Oxygen Saturation % 98 %     2 Minute Post Liters of Oxygen 1 L            Oxygen Initial Assessment:  Oxygen Initial Assessment - 03/28/20 1213      Home Oxygen   Home Oxygen Device Portable Concentrator;E-Tanks    Sleep Oxygen Prescription None    Home Exercise Oxygen Prescription Continuous    Liters per minute 1    Home Resting Oxygen Prescription None    Compliance with Home Oxygen Use Yes      Initial 6 min Walk   Oxygen Used Continuous    Liters per minute 1      Program Oxygen Prescription   Program Oxygen Prescription Continuous    Liters per minute -1    Comments Pt may need 2L of oxygen for walking longer distances. Started her on RA for 6 minute walk test, she desaturated to 86/87% right before minute 3. Put her on 1L and she started to drop again below 87% around minute 5 and 6. Will monitor oxygen needs with  specific exercises.      Intervention   Short Term Goals To learn and exhibit compliance with exercise, home and travel O2 prescription;To learn and understand importance of monitoring SPO2 with pulse oximeter and demonstrate accurate use of the pulse oximeter.;To learn and understand importance of maintaining oxygen saturations>88%;To learn and demonstrate proper pursed lip breathing techniques or other breathing techniques.;To learn and demonstrate proper use of respiratory medications    Long  Term Goals Maintenance of O2 saturations>88%;Exhibits compliance with exercise, home and travel O2 prescription;Verbalizes importance of monitoring SPO2 with pulse oximeter and return demonstration;Exhibits proper breathing techniques, such as pursed lip breathing or other method taught during program session;Compliance with respiratory medication;Demonstrates proper use of MDI's           Oxygen Re-Evaluation:   Oxygen Discharge (Final Oxygen Re-Evaluation):   Initial Exercise Prescription:  Initial Exercise Prescription - 03/28/20 1400      Date of Initial Exercise RX and Referring Provider   Date 03/28/20    Referring Provider Dr. Erin Fulling    Expected Discharge  Date 06/02/20      Treadmill   MPH 2.2    Grade 0    Minutes 15      NuStep   Level 1    SPM 80    Minutes 15      Prescription Details   Frequency (times per week) 2    Duration Progress to 30 minutes of continuous aerobic without signs/symptoms of physical distress      Intensity   THRR 40-80% of Max Heartrate 60-119    Ratings of Perceived Exertion 11-13    Perceived Dyspnea 0-4      Progression   Progression Continue to progress workloads to maintain intensity without signs/symptoms of physical distress.      Resistance Training   Training Prescription Yes    Weight orange bands    Reps 10-15           Perform Capillary Blood Glucose checks as needed.  Exercise Prescription Changes:   Exercise  Comments:   Exercise Goals and Review:  Exercise Goals    Row Name 03/28/20 1435             Exercise Goals   Increase Physical Activity Yes       Intervention Provide advice, education, support and counseling about physical activity/exercise needs.;Develop an individualized exercise prescription for aerobic and resistive training based on initial evaluation findings, risk stratification, comorbidities and participant's personal goals.       Expected Outcomes Short Term: Attend rehab on a regular basis to increase amount of physical activity.;Long Term: Add in home exercise to make exercise part of routine and to increase amount of physical activity.;Long Term: Exercising regularly at least 3-5 days a week.       Increase Strength and Stamina Yes       Intervention Provide advice, education, support and counseling about physical activity/exercise needs.;Develop an individualized exercise prescription for aerobic and resistive training based on initial evaluation findings, risk stratification, comorbidities and participant's personal goals.       Expected Outcomes Short Term: Increase workloads from initial exercise prescription for resistance, speed, and METs.;Short Term: Perform resistance training exercises routinely during rehab and add in resistance training at home;Long Term: Improve cardiorespiratory fitness, muscular endurance and strength as measured by increased METs and functional capacity (6MWT)       Able to understand and use rate of perceived exertion (RPE) scale Yes       Intervention Provide education and explanation on how to use RPE scale       Expected Outcomes Short Term: Able to use RPE daily in rehab to express subjective intensity level;Long Term:  Able to use RPE to guide intensity level when exercising independently       Able to understand and use Dyspnea scale Yes       Intervention Provide education and explanation on how to use Dyspnea scale       Expected Outcomes  Short Term: Able to use Dyspnea scale daily in rehab to express subjective sense of shortness of breath during exertion;Long Term: Able to use Dyspnea scale to guide intensity level when exercising independently       Knowledge and understanding of Target Heart Rate Range (THRR) Yes       Intervention Provide education and explanation of THRR including how the numbers were predicted and where they are located for reference       Expected Outcomes Short Term: Able to state/look up THRR;Long Term: Able to use THRR  to govern intensity when exercising independently;Short Term: Able to use daily as guideline for intensity in rehab       Understanding of Exercise Prescription Yes       Intervention Provide education, explanation, and written materials on patient's individual exercise prescription       Expected Outcomes Short Term: Able to explain program exercise prescription;Long Term: Able to explain home exercise prescription to exercise independently              Exercise Goals Re-Evaluation :   Discharge Exercise Prescription (Final Exercise Prescription Changes):   Nutrition:  Target Goals: Understanding of nutrition guidelines, daily intake of sodium 1500mg , cholesterol 200mg , calories 30% from fat and 7% or less from saturated fats, daily to have 5 or more servings of fruits and vegetables.  Biometrics:  Pre Biometrics - 03/28/20 1140      Pre Biometrics   Grip Strength 30.5 kg            Nutrition Therapy Plan and Nutrition Goals:   Nutrition Assessments:  MEDIFICTS Score Key:  ?70 Need to make dietary changes   40-70 Heart Healthy Diet  ? 40 Therapeutic Level Cholesterol Diet   Picture Your Plate Scores:  <86 Unhealthy dietary pattern with much room for improvement.  41-50 Dietary pattern unlikely to meet recommendations for good health and room for improvement.  51-60 More healthful dietary pattern, with some room for improvement.   >60 Healthy dietary  pattern, although there may be some specific behaviors that could be improved.    Nutrition Goals Re-Evaluation:   Nutrition Goals Discharge (Final Nutrition Goals Re-Evaluation):   Psychosocial: Target Goals: Acknowledge presence or absence of significant depression and/or stress, maximize coping skills, provide positive support system. Participant is able to verbalize types and ability to use techniques and skills needed for reducing stress and depression.  Initial Review & Psychosocial Screening:  Initial Psych Review & Screening - 03/28/20 1202      Initial Review   Current issues with None Identified      Family Dynamics   Good Support System? Yes      Barriers   Psychosocial barriers to participate in program There are no identifiable barriers or psychosocial needs.      Screening Interventions   Interventions Encouraged to exercise           Quality of Life Scores:  Scores of 19 and below usually indicate a poorer quality of life in these areas.  A difference of  2-3 points is a clinically meaningful difference.  A difference of 2-3 points in the total score of the Quality of Life Index has been associated with significant improvement in overall quality of life, self-image, physical symptoms, and general health in studies assessing change in quality of life.  PHQ-9: Recent Review Flowsheet Data    Depression screen [facility] 2/9 03/28/2020   Decreased Interest 0   Down, Depressed, Hopeless 0   PHQ - 2 Score 0   Altered sleeping 0   Tired, decreased energy 1   Change in appetite 0   Feeling bad or failure about yourself  0   Trouble concentrating 0   Moving slowly or fidgety/restless 0   Suicidal thoughts 0   PHQ-9 Score 1   Difficult doing work/chores Not difficult at all     Interpretation of Total Score  Total Score Depression Severity:  1-4 = Minimal depression, 5-9 = Mild depression, 10-14 = Moderate depression, 15-19 = Moderately severe depression, 20-27 =  Severe depression   Psychosocial Evaluation and Intervention:  Psychosocial Evaluation - 03/28/20 1211      Psychosocial Evaluation & Interventions   Interventions Encouraged to exercise with the program and follow exercise prescription    Comments No concerns identified    Expected Outcomes To continue to have no psychosocial concerns while participating in pulmonary rehab.    Continue Psychosocial Services  No Follow up required           Psychosocial Re-Evaluation:   Psychosocial Discharge (Final Psychosocial Re-Evaluation):   Education: Education Goals: Education classes will be provided on a weekly basis, covering required topics. Participant will state understanding/return demonstration of topics presented.  Learning Barriers/Preferences:  Learning Barriers/Preferences - 03/28/20 1213      Learning Barriers/Preferences   Learning Barriers None    Learning Preferences Computer/Internet;Written Material           Education Topics: Risk Factor Reduction:  -Group instruction that is supported by a PowerPoint presentation. Instructor discusses the definition of a risk factor, different risk factors for pulmonary disease, and how the heart and lungs work together.     Nutrition for Pulmonary Patient:  -Group instruction provided by PowerPoint slides, verbal discussion, and written materials to support subject matter. The instructor gives an explanation and review of healthy diet recommendations, which includes a discussion on weight management, recommendations for fruit and vegetable consumption, as well as protein, fluid, caffeine, fiber, sodium, sugar, and alcohol. Tips for eating when patients are short of breath are discussed.   Pursed Lip Breathing:  -Group instruction that is supported by demonstration and informational handouts. Instructor discusses the benefits of pursed lip and diaphragmatic breathing and detailed demonstration on how to preform both.      Oxygen Safety:  -Group instruction provided by PowerPoint, verbal discussion, and written material to support subject matter. There is an overview of "What is Oxygen" and "Why do we need it".  Instructor also reviews how to create a safe environment for oxygen use, the importance of using oxygen as prescribed, and the risks of noncompliance. There is a brief discussion on traveling with oxygen and resources the patient may utilize.   Oxygen Equipment:  -Group instruction provided by [facility] Staff utilizing handouts, written materials, and equipment demonstrations.   Signs and Symptoms:  -Group instruction provided by written material and verbal discussion to support subject matter. Warning signs and symptoms of infection, stroke, and heart attack are reviewed and when to call the physician/911 reinforced. Tips for preventing the spread of infection discussed.   Advanced Directives:  -Group instruction provided by verbal instruction and written material to support subject matter. Instructor reviews Advanced Directive laws and proper instruction for filling out document.   Pulmonary Video:  -Group video education that reviews the importance of medication and oxygen compliance, exercise, good nutrition, pulmonary hygiene, and pursed lip and diaphragmatic breathing for the pulmonary patient.   Exercise for the Pulmonary Patient:  -Group instruction that is supported by a PowerPoint presentation. Instructor discusses benefits of exercise, core components of exercise, frequency, duration, and intensity of an exercise routine, importance of utilizing pulse oximetry during exercise, safety while exercising, and options of places to exercise outside of rehab.     Pulmonary Medications:  -Verbally interactive group education provided by instructor with focus on inhaled medications and proper administration.   Anatomy and Physiology of the Respiratory System and Intimacy:  -Group  instruction provided by PowerPoint, verbal discussion, and written material to support subject matter. Instructor  reviews respiratory cycle and anatomical components of the respiratory system and their functions. Instructor also reviews differences in obstructive and restrictive respiratory diseases with examples of each. Intimacy, Sex, and Sexuality differences are reviewed with a discussion on how relationships can change when diagnosed with pulmonary disease. Common sexual concerns are reviewed.   MD DAY -A group question and answer session with a medical doctor that allows participants to ask questions that relate to their pulmonary disease state.   OTHER EDUCATION -Group or individual verbal, written, or video instructions that support the educational goals of the pulmonary rehab program.   Holiday Eating Survival Tips:  -Group instruction provided by PowerPoint slides, verbal discussion, and written materials to support subject matter. The instructor gives patients tips, tricks, and techniques to help them not only survive but enjoy the holidays despite the onslaught of food that accompanies the holidays.   Knowledge Questionnaire Score:  Knowledge Questionnaire Score - 03/28/20 1205      Knowledge Questionnaire Score   Pre Score 16/18           Core Components/Risk Factors/Patient Goals at Admission:  Personal Goals and Risk Factors at Admission - 03/28/20 1205      Core Components/Risk Factors/Patient Goals on Admission   Improve shortness of breath with ADL's Yes    Intervention Provide education, individualized exercise plan and daily activity instruction to help decrease symptoms of SOB with activities of daily living.    Expected Outcomes Short Term: Improve cardiorespiratory fitness to achieve a reduction of symptoms when performing ADLs;Long Term: Be able to perform more ADLs without symptoms or delay the onset of symptoms           Core Components/Risk  Factors/Patient Goals Review:   Goals and Risk Factor Review    Row Name 03/28/20 1205             Core Components/Risk Factors/Patient Goals Review   Personal Goals Review Develop more efficient breathing techniques such as purse lipped breathing and diaphragmatic breathing and practicing self-pacing with activity.;Increase knowledge of respiratory medications and ability to use respiratory devices properly.;Improve shortness of breath with ADL's              Core Components/Risk Factors/Patient Goals at Discharge (Final Review):   Goals and Risk Factor Review - 03/28/20 1205      Core Components/Risk Factors/Patient Goals Review   Personal Goals Review Develop more efficient breathing techniques such as purse lipped breathing and diaphragmatic breathing and practicing self-pacing with activity.;Increase knowledge of respiratory medications and ability to use respiratory devices properly.;Improve shortness of breath with ADL's           ITP Comments:   Comments:

## 2020-03-31 ENCOUNTER — Telehealth: Payer: Self-pay | Admitting: Primary Care

## 2020-03-31 NOTE — Telephone Encounter (Signed)
Notified patient.

## 2020-03-31 NOTE — Telephone Encounter (Signed)
-----   Message from Freddi Starr, MD sent at 03/29/2020  9:35 PM EDT ----- Please let the patient know she has a mild restrictive defect where her lungs are stiffer than normal and she also has a diffusion defect where oxygen does not travel across her lung tissue as easily. Both of the findings can be related to the covid infection she had.   Thanks, Dr. Erin Fulling

## 2020-04-05 ENCOUNTER — Other Ambulatory Visit: Payer: Self-pay

## 2020-04-05 ENCOUNTER — Encounter (HOSPITAL_COMMUNITY)
Admission: RE | Admit: 2020-04-05 | Discharge: 2020-04-05 | Disposition: A | Discharge status: Home / Self Care | Payer: Medicare Other | Source: Ambulatory Visit | Attending: Pulmonary Disease | Admitting: Pulmonary Disease

## 2020-04-05 DIAGNOSIS — J439 Emphysema, unspecified: Secondary | ICD-10-CM

## 2020-04-05 NOTE — Progress Notes (Signed)
Pulmonary Individual Treatment Plan  Patient Details  Name: ARIEAL CUOCO MRN: [id number] Date of Birth: 06/27/1950 Referring Provider:   April Manson Pulmonary Rehab Walk Test from 03/28/2020 in [place]  Referring Provider Dr. Erin Fulling      Initial Encounter Date:  Flowsheet Row Pulmonary Rehab Walk Test from 03/28/2020 in [facility]  Date 03/28/20      Visit Diagnosis: Pulmonary emphysema, unspecified emphysema type ([place])  Patient's Home Medications on Admission:   Current Outpatient Medications:  .  acetaminophen (TYLENOL) 500 MG tablet, Take 1,000 mg by mouth every 6 (six) hours as needed (for hip pain or mild headaches). (Patient not taking: Reported on 03/28/2020), Disp: , Rfl:  .  albuterol (VENTOLIN HFA) 108 (90 Base) MCG/ACT inhaler, Inhale 2 puffs into the lungs every 6 (six) hours as needed for wheezing or shortness of breath. (Patient not taking: Reported on 03/28/2020), Disp: 6.7 g, Rfl: 0 .  Camphor-Menthol-Methyl Sal (SALONPAS EX), Place 1 patch onto the skin See admin instructions. Apply 1 patch one to two times a day as needed for hip pain (remove old patch first) (Patient not taking: Reported on 03/28/2020), Disp: , Rfl:  .  diclofenac Sodium (VOLTAREN) 1 % GEL, Apply topically., Disp: , Rfl:  .  DULoxetine (CYMBALTA) 30 MG capsule, Take 30 mg by mouth daily., Disp: , Rfl:  .  Fluticasone-Umeclidin-Vilant (TRELEGY ELLIPTA) 100-62.5-25 MCG/INH AEPB, Inhale 1 puff into the lungs daily., Disp: 28 each, Rfl: 6 .  Fluticasone-Umeclidin-Vilant (TRELEGY ELLIPTA) 100-62.5-25 MCG/INH AEPB, Inhale 1 puff into the lungs daily., Disp: 60 each, Rfl: 0 .  losartan (COZAAR) 50 MG tablet, Take 50 mg by mouth daily., Disp: , Rfl:  .  meloxicam (MOBIC) 7.5 MG tablet, Take 15 mg by mouth daily. (Patient not taking: Reported on 03/28/2020), Disp: , Rfl:  .  Multiple Vitamins-Minerals (ONE-A-DAY PROACTIVE 65+) TABS, Take 1  tablet by mouth daily with breakfast., Disp: , Rfl:  .  naphazoline-glycerin (CLEAR EYES REDNESS) 0.012-0.2 % SOLN, Place 1 drop into both eyes 4 (four) times daily as needed for eye irritation. (Patient not taking: Reported on 03/28/2020), Disp: , Rfl:  .  simvastatin (ZOCOR) 20 MG tablet, Take 20 mg by mouth daily. , Disp: , Rfl: 5 .  sodium chloride (OCEAN) 0.65 % SOLN nasal spray, Place 1 spray into both nostrils as needed for congestion. (Patient not taking: Reported on 03/28/2020), Disp: 30 mL, Rfl: 0  Past Medical History: Past Medical History:  Diagnosis Date  . Anxiety   . COPD (chronic obstructive pulmonary disease) ([place])   . Depression   . Hyperlipidemia   . Hypertension   . Multinodular goiter     Tobacco Use: Social History   Tobacco Use  Smoking Status Former Smoker  . Quit date: 01/09/2008  . Years since quitting: 12.2  Smokeless Tobacco Never Used    Labs: Recent Review Scientist, physiological    Labs for ITP Cardiac and Pulmonary Rehab Latest Ref Rng & Units 10/27/2006 10/28/2006 10/28/2019   Cholestrol - - 167 ... -   LDLCALC - - 105 .Marland Kitchen.(H) -   HDL - - 57 -   Trlycerides <150 mg/dL - 25 69   PHART - 7.288(L) 7.387 -   PCO2ART - 46.7(H) 44.2 -   HCO3 - 21.6 26.2(H) -   TCO2 - 19.6 23.5 -   ACIDBASEDEF - 4.7(H) - -   O2SAT - 97.4 98.1 -  Capillary Blood Glucose: No results found for: GLUCAP   Pulmonary Assessment Scores:  Pulmonary Assessment Scores    Row Name 03/28/20 1201 03/28/20 1551       ADL UCSD   ADL Phase Entry --    SOB Score total 31 --         CAT Score   CAT Score 13 --         mMRC Score   mMRC Score -- 1          UCSD: Self-administered rating of dyspnea associated with activities of daily living (ADLs) 6-point scale (0 = "not at all" to 5 = "maximal or unable to do because of breathlessness")  Scoring Scores range from 0 to 120.  Minimally important difference is 5 units  CAT: CAT can identify the health impairment of  COPD patients and is better correlated with disease progression.  CAT has a scoring range of zero to 40. The CAT score is classified into four groups of low (less than 10), medium (10 - 20), high (21-30) and very high (31-40) based on the impact level of disease on health status. A CAT score over 10 suggests significant symptoms.  A worsening CAT score could be explained by an exacerbation, poor medication adherence, poor inhaler technique, or progression of COPD or comorbid conditions.  CAT MCID is 2 points  mMRC: mMRC (Modified Medical Research Council) Dyspnea Scale is used to assess the degree of baseline functional disability in patients of respiratory disease due to dyspnea. No minimal important difference is established. A decrease in score of 1 point or greater is considered a positive change.   Pulmonary Function Assessment:  Pulmonary Function Assessment - 03/28/20 1214      Breath   Bilateral Breath Sounds Clear    Shortness of Breath Yes;Limiting activity           Exercise Target Goals: Exercise Program Goal: Individual exercise prescription set using results from initial 6 min walk test and THRR while considering  patient's activity barriers and safety.   Exercise Prescription Goal: Initial exercise prescription builds to 30-45 minutes a day of aerobic activity, 2-3 days per week.  Home exercise guidelines will be given to patient during program as part of exercise prescription that the participant will acknowledge.  Activity Barriers & Risk Stratification:  Activity Barriers & Cardiac Risk Stratification - 03/28/20 1139      Activity Barriers & Cardiac Risk Stratification   Activity Barriers Arthritis;Back Problems;Right Hip Replacement;Joint Problems;Deconditioning;Muscular Weakness;Shortness of Breath           6 Minute Walk:  6 Minute Walk    Row Name 03/28/20 1439         6 Minute Walk   Phase Initial     Distance 1356 feet     Walk Time 6 minutes      # of Rest Breaks 0     MPH 2.57     METS 3.35     RPE 9     Perceived Dyspnea  0     VO2 Peak 11.74     Symptoms No     Resting HR 75 bpm     Resting BP 148/88     Resting Oxygen Saturation  93 %     Exercise Oxygen Saturation  during 6 min walk 86 %     Max Ex. HR 99 bpm     Max Ex. BP 152/86     2 Minute Post BP 140/84  Interval HR   1 Minute HR 90     2 Minute HR 99     3 Minute HR 98     4 Minute HR 95     5 Minute HR 91     6 Minute HR 97     2 Minute Post HR 67     Interval Heart Rate? Yes           Interval Oxygen   Interval Oxygen? Yes     Baseline Oxygen Saturation % 93 %     1 Minute Oxygen Saturation % 92 %     1 Minute Liters of Oxygen 0 L     2 Minute Oxygen Saturation % 89 %     2 Minute Liters of Oxygen 0 L     3 Minute Oxygen Saturation % 87 %     3 Minute Liters of Oxygen 1 L     4 Minute Oxygen Saturation % 91 %     4 Minute Liters of Oxygen 1 L     5 Minute Oxygen Saturation % 89 %     5 Minute Liters of Oxygen 1 L     6 Minute Oxygen Saturation % 86 %     6 Minute Liters of Oxygen 1 L     2 Minute Post Oxygen Saturation % 98 %     2 Minute Post Liters of Oxygen 1 L            Oxygen Initial Assessment:  Oxygen Initial Assessment - 03/28/20 1213      Home Oxygen   Home Oxygen Device Portable Concentrator;E-Tanks    Sleep Oxygen Prescription None    Home Exercise Oxygen Prescription Continuous    Liters per minute 1    Home Resting Oxygen Prescription None    Compliance with Home Oxygen Use Yes      Initial 6 min Walk   Oxygen Used Continuous    Liters per minute 1      Program Oxygen Prescription   Program Oxygen Prescription Continuous    Liters per minute -1    Comments Pt may need 2L of oxygen for walking longer distances. Started her on RA for 6 minute walk test, she desaturated to 86/87% right before minute 3. Put her on 1L and she started to drop again below 87% around minute 5 and 6. Will monitor oxygen needs with  specific exercises.      Intervention   Short Term Goals To learn and exhibit compliance with exercise, home and travel O2 prescription;To learn and understand importance of monitoring SPO2 with pulse oximeter and demonstrate accurate use of the pulse oximeter.;To learn and understand importance of maintaining oxygen saturations>88%;To learn and demonstrate proper pursed lip breathing techniques or other breathing techniques.;To learn and demonstrate proper use of respiratory medications    Long  Term Goals Maintenance of O2 saturations>88%;Exhibits compliance with exercise, home and travel O2 prescription;Verbalizes importance of monitoring SPO2 with pulse oximeter and return demonstration;Exhibits proper breathing techniques, such as pursed lip breathing or other method taught during program session;Compliance with respiratory medication;Demonstrates proper use of MDI's           Oxygen Re-Evaluation:  Oxygen Re-Evaluation    Row Name 04/05/20 0832             Program Oxygen Prescription   Program Oxygen Prescription Continuous       Liters per minute 1       Comments  Pt may need 2L of oxygen for walking longer distances. Started her on RA for 6 minute walk test, she desaturated to 86/87% right before minute 3. Put her on 1L and she started to drop again below 87% around minute 5 and 6. Will monitor oxygen needs with specific exercises.               Home Oxygen   Home Oxygen Device Portable Concentrator;E-Tanks       Sleep Oxygen Prescription None       Home Exercise Oxygen Prescription Continuous       Liters per minute 1       Home Resting Oxygen Prescription None       Compliance with Home Oxygen Use Yes               Goals/Expected Outcomes   Short Term Goals To learn and exhibit compliance with exercise, home and travel O2 prescription;To learn and understand importance of monitoring SPO2 with pulse oximeter and demonstrate accurate use of the pulse oximeter.;To learn and  understand importance of maintaining oxygen saturations>88%;To learn and demonstrate proper pursed lip breathing techniques or other breathing techniques.;To learn and demonstrate proper use of respiratory medications       Long  Term Goals Maintenance of O2 saturations>88%;Exhibits compliance with exercise, home and travel O2 prescription;Verbalizes importance of monitoring SPO2 with pulse oximeter and return demonstration;Exhibits proper breathing techniques, such as pursed lip breathing or other method taught during program session;Compliance with respiratory medication;Demonstrates proper use of MDI's       Goals/Expected Outcomes compliance and understanding of oxygen saturation and pursed lip breathing.              Oxygen Discharge (Final Oxygen Re-Evaluation):  Oxygen Re-Evaluation - 04/05/20 0832      Program Oxygen Prescription   Program Oxygen Prescription Continuous    Liters per minute 1    Comments Pt may need 2L of oxygen for walking longer distances. Started her on RA for 6 minute walk test, she desaturated to 86/87% right before minute 3. Put her on 1L and she started to drop again below 87% around minute 5 and 6. Will monitor oxygen needs with specific exercises.      Home Oxygen   Home Oxygen Device Portable Concentrator;E-Tanks    Sleep Oxygen Prescription None    Home Exercise Oxygen Prescription Continuous    Liters per minute 1    Home Resting Oxygen Prescription None    Compliance with Home Oxygen Use Yes      Goals/Expected Outcomes   Short Term Goals To learn and exhibit compliance with exercise, home and travel O2 prescription;To learn and understand importance of monitoring SPO2 with pulse oximeter and demonstrate accurate use of the pulse oximeter.;To learn and understand importance of maintaining oxygen saturations>88%;To learn and demonstrate proper pursed lip breathing techniques or other breathing techniques.;To learn and demonstrate proper use of  respiratory medications    Long  Term Goals Maintenance of O2 saturations>88%;Exhibits compliance with exercise, home and travel O2 prescription;Verbalizes importance of monitoring SPO2 with pulse oximeter and return demonstration;Exhibits proper breathing techniques, such as pursed lip breathing or other method taught during program session;Compliance with respiratory medication;Demonstrates proper use of MDI's    Goals/Expected Outcomes compliance and understanding of oxygen saturation and pursed lip breathing.           Initial Exercise Prescription:  Initial Exercise Prescription - 03/28/20 1400      Date of Initial Exercise RX  and Referring Provider   Date 03/28/20    Referring Provider Dr. Erin Fulling    Expected Discharge Date 06/02/20      Treadmill   MPH 2.2    Grade 0    Minutes 15      NuStep   Level 1    SPM 80    Minutes 15      Prescription Details   Frequency (times per week) 2    Duration Progress to 30 minutes of continuous aerobic without signs/symptoms of physical distress      Intensity   THRR 40-80% of Max Heartrate 60-119    Ratings of Perceived Exertion 11-13    Perceived Dyspnea 0-4      Progression   Progression Continue to progress workloads to maintain intensity without signs/symptoms of physical distress.      Resistance Training   Training Prescription Yes    Weight orange bands    Reps 10-15           Perform Capillary Blood Glucose checks as needed.  Exercise Prescription Changes:   Exercise Comments:   Exercise Goals and Review:  Exercise Goals    Row Name 03/28/20 1435             Exercise Goals   Increase Physical Activity Yes       Intervention Provide advice, education, support and counseling about physical activity/exercise needs.;Develop an individualized exercise prescription for aerobic and resistive training based on initial evaluation findings, risk stratification, comorbidities and participant's personal goals.        Expected Outcomes Short Term: Attend rehab on a regular basis to increase amount of physical activity.;Long Term: Add in home exercise to make exercise part of routine and to increase amount of physical activity.;Long Term: Exercising regularly at least 3-5 days a week.       Increase Strength and Stamina Yes       Intervention Provide advice, education, support and counseling about physical activity/exercise needs.;Develop an individualized exercise prescription for aerobic and resistive training based on initial evaluation findings, risk stratification, comorbidities and participant's personal goals.       Expected Outcomes Short Term: Increase workloads from initial exercise prescription for resistance, speed, and METs.;Short Term: Perform resistance training exercises routinely during rehab and add in resistance training at home;Long Term: Improve cardiorespiratory fitness, muscular endurance and strength as measured by increased METs and functional capacity (6MWT)       Able to understand and use rate of perceived exertion (RPE) scale Yes       Intervention Provide education and explanation on how to use RPE scale       Expected Outcomes Short Term: Able to use RPE daily in rehab to express subjective intensity level;Long Term:  Able to use RPE to guide intensity level when exercising independently       Able to understand and use Dyspnea scale Yes       Intervention Provide education and explanation on how to use Dyspnea scale       Expected Outcomes Short Term: Able to use Dyspnea scale daily in rehab to express subjective sense of shortness of breath during exertion;Long Term: Able to use Dyspnea scale to guide intensity level when exercising independently       Knowledge and understanding of Target Heart Rate Range (THRR) Yes       Intervention Provide education and explanation of THRR including how the numbers were predicted and where they are located for reference  Expected Outcomes  Short Term: Able to state/look up THRR;Long Term: Able to use THRR to govern intensity when exercising independently;Short Term: Able to use daily as guideline for intensity in rehab       Understanding of Exercise Prescription Yes       Intervention Provide education, explanation, and written materials on patient's individual exercise prescription       Expected Outcomes Short Term: Able to explain program exercise prescription;Long Term: Able to explain home exercise prescription to exercise independently              Exercise Goals Re-Evaluation :  Exercise Goals Re-Evaluation    Row Name 04/05/20 0830             Exercise Goal Re-Evaluation   Exercise Goals Review Increase Physical Activity;Increase Strength and Stamina;Able to understand and use rate of perceived exertion (RPE) scale;Able to understand and use Dyspnea scale;Knowledge and understanding of Target Heart Rate Range (THRR);Understanding of Exercise Prescription       Comments Pt starts exercise this week and is too soon to note any progression. Will continue to monitor and follow.       Expected Outcomes Through exercise at rehab and home, the patient will decrease shortness of breath with daily activities and feel confident in carrying out an exercise regimn at home.              Discharge Exercise Prescription (Final Exercise Prescription Changes):   Nutrition:  Target Goals: Understanding of nutrition guidelines, daily intake of sodium 1500mg , cholesterol 200mg , calories 30% from fat and 7% or less from saturated fats, daily to have 5 or more servings of fruits and vegetables.  Biometrics:  Pre Biometrics - 03/28/20 1140      Pre Biometrics   Grip Strength 30.5 kg            Nutrition Therapy Plan and Nutrition Goals:   Nutrition Assessments:  MEDIFICTS Score Key:  ?70 Need to make dietary changes   40-70 Heart Healthy Diet  ? 40 Therapeutic Level Cholesterol Diet   Picture Your Plate  Scores:  <58 Unhealthy dietary pattern with much room for improvement.  41-50 Dietary pattern unlikely to meet recommendations for good health and room for improvement.  51-60 More healthful dietary pattern, with some room for improvement.   >60 Healthy dietary pattern, although there may be some specific behaviors that could be improved.    Nutrition Goals Re-Evaluation:   Nutrition Goals Discharge (Final Nutrition Goals Re-Evaluation):   Psychosocial: Target Goals: Acknowledge presence or absence of significant depression and/or stress, maximize coping skills, provide positive support system. Participant is able to verbalize types and ability to use techniques and skills needed for reducing stress and depression.  Initial Review & Psychosocial Screening:  Initial Psych Review & Screening - 03/28/20 1202      Initial Review   Current issues with None Identified      Family Dynamics   Good Support System? Yes      Barriers   Psychosocial barriers to participate in program There are no identifiable barriers or psychosocial needs.      Screening Interventions   Interventions Encouraged to exercise           Quality of Life Scores:  Scores of 19 and below usually indicate a poorer quality of life in these areas.  A difference of  2-3 points is a clinically meaningful difference.  A difference of 2-3 points in the total score of the  Quality of Life Index has been associated with significant improvement in overall quality of life, self-image, physical symptoms, and general health in studies assessing change in quality of life.  PHQ-9: Recent Review Flowsheet Data    Depression screen [facility] 2/9 03/28/2020   Decreased Interest 0   Down, Depressed, Hopeless 0   PHQ - 2 Score 0   Altered sleeping 0   Tired, decreased energy 1   Change in appetite 0   Feeling bad or failure about yourself  0   Trouble concentrating 0   Moving slowly or fidgety/restless 0   Suicidal thoughts  0   PHQ-9 Score 1   Difficult doing work/chores Not difficult at all     Interpretation of Total Score  Total Score Depression Severity:  1-4 = Minimal depression, 5-9 = Mild depression, 10-14 = Moderate depression, 15-19 = Moderately severe depression, 20-27 = Severe depression   Psychosocial Evaluation and Intervention:  Psychosocial Evaluation - 03/28/20 1211      Psychosocial Evaluation & Interventions   Interventions Encouraged to exercise with the program and follow exercise prescription    Comments No concerns identified    Expected Outcomes To continue to have no psychosocial concerns while participating in pulmonary rehab.    Continue Psychosocial Services  No Follow up required           Psychosocial Re-Evaluation:  Psychosocial Re-Evaluation    [place] Name 04/04/20 1221 04/05/20 1607           Psychosocial Re-Evaluation   Current issues with None Identified None Identified      Comments No change since orientation/walk test 1 week ago. No psychosocial concerns identified at this time.      Expected Outcomes To continue to be free of psychosocial concerns while participating in pulmonary rehab. For Selena to continue to be free of psychosocial concerns while participating in pulmonary rehab.      Interventions Encouraged to attend Pulmonary Rehabilitation for the exercise Encouraged to attend Pulmonary Rehabilitation for the exercise      Continue Psychosocial Services  No Follow up required No Follow up required             Psychosocial Discharge (Final Psychosocial Re-Evaluation):  Psychosocial Re-Evaluation - 04/05/20 0903      Psychosocial Re-Evaluation   Current issues with None Identified    Comments No psychosocial concerns identified at this time.    Expected Outcomes For [place] to continue to be free of psychosocial concerns while participating in pulmonary rehab.    Interventions Encouraged to attend Pulmonary Rehabilitation for the exercise     Continue Psychosocial Services  No Follow up required           Education: Education Goals: Education classes will be provided on a weekly basis, covering required topics. Participant will state understanding/return demonstration of topics presented.  Learning Barriers/Preferences:  Learning Barriers/Preferences - 03/28/20 1213      Learning Barriers/Preferences   Learning Barriers None    Learning Preferences Computer/Internet;Written Material           Education Topics: Risk Factor Reduction:  -Group instruction that is supported by a PowerPoint presentation. Instructor discusses the definition of a risk factor, different risk factors for pulmonary disease, and how the heart and lungs work together.     Nutrition for Pulmonary Patient:  -Group instruction provided by PowerPoint slides, verbal discussion, and written materials to support subject matter. The instructor gives an explanation and review of healthy diet recommendations, which  includes a discussion on weight management, recommendations for fruit and vegetable consumption, as well as protein, fluid, caffeine, fiber, sodium, sugar, and alcohol. Tips for eating when patients are short of breath are discussed.   Pursed Lip Breathing:  -Group instruction that is supported by demonstration and informational handouts. Instructor discusses the benefits of pursed lip and diaphragmatic breathing and detailed demonstration on how to preform both.     Oxygen Safety:  -Group instruction provided by PowerPoint, verbal discussion, and written material to support subject matter. There is an overview of "What is Oxygen" and "Why do we need it".  Instructor also reviews how to create a safe environment for oxygen use, the importance of using oxygen as prescribed, and the risks of noncompliance. There is a brief discussion on traveling with oxygen and resources the patient may utilize.   Oxygen Equipment:  -Group instruction provided  by [employer] Staff utilizing handouts, written materials, and equipment demonstrations.   Signs and Symptoms:  -Group instruction provided by written material and verbal discussion to support subject matter. Warning signs and symptoms of infection, stroke, and heart attack are reviewed and when to call the physician/911 reinforced. Tips for preventing the spread of infection discussed.   Advanced Directives:  -Group instruction provided by verbal instruction and written material to support subject matter. Instructor reviews Advanced Directive laws and proper instruction for filling out document.   Pulmonary Video:  -Group video education that reviews the importance of medication and oxygen compliance, exercise, good nutrition, pulmonary hygiene, and pursed lip and diaphragmatic breathing for the pulmonary patient.   Exercise for the Pulmonary Patient:  -Group instruction that is supported by a PowerPoint presentation. Instructor discusses benefits of exercise, core components of exercise, frequency, duration, and intensity of an exercise routine, importance of utilizing pulse oximetry during exercise, safety while exercising, and options of places to exercise outside of rehab.     Pulmonary Medications:  -Verbally interactive group education provided by instructor with focus on inhaled medications and proper administration.   Anatomy and Physiology of the Respiratory System and Intimacy:  -Group instruction provided by PowerPoint, verbal discussion, and written material to support subject matter. Instructor reviews respiratory cycle and anatomical components of the respiratory system and their functions. Instructor also reviews differences in obstructive and restrictive respiratory diseases with examples of each. Intimacy, Sex, and Sexuality differences are reviewed with a discussion on how relationships can change when diagnosed with pulmonary disease. Common sexual concerns are  reviewed.   MD DAY -A group question and answer session with a medical doctor that allows participants to ask questions that relate to their pulmonary disease state.   OTHER EDUCATION -Group or individual verbal, written, or video instructions that support the educational goals of the pulmonary rehab program.   Holiday Eating Survival Tips:  -Group instruction provided by PowerPoint slides, verbal discussion, and written materials to support subject matter. The instructor gives patients tips, tricks, and techniques to help them not only survive but enjoy the holidays despite the onslaught of food that accompanies the holidays.   Knowledge Questionnaire Score:  Knowledge Questionnaire Score - 03/28/20 1205      Knowledge Questionnaire Score   Pre Score 16/18           Core Components/Risk Factors/Patient Goals at Admission:  Personal Goals and Risk Factors at Admission - 03/28/20 1205      Core Components/Risk Factors/Patient Goals on Admission   Improve shortness of breath with ADL's Yes    Intervention  Provide education, individualized exercise plan and daily activity instruction to help decrease symptoms of SOB with activities of daily living.    Expected Outcomes Short Term: Improve cardiorespiratory fitness to achieve a reduction of symptoms when performing ADLs;Long Term: Be able to perform more ADLs without symptoms or delay the onset of symptoms           Core Components/Risk Factors/Patient Goals Review:   Goals and Risk Factor Review    Row Name 03/28/20 1205 04/04/20 1222 04/05/20 0904         Core Components/Risk Factors/Patient Goals Review   Personal Goals Review Develop more efficient breathing techniques such as purse lipped breathing and diaphragmatic breathing and practicing self-pacing with activity.;Increase knowledge of respiratory medications and ability to use respiratory devices properly.;Improve shortness of breath with ADL's Develop more efficient  breathing techniques such as purse lipped breathing and diaphragmatic breathing and practicing self-pacing with activity.;Increase knowledge of respiratory medications and ability to use respiratory devices properly.;Improve shortness of breath with ADL's Develop more efficient breathing techniques such as purse lipped breathing and diaphragmatic breathing and practicing self-pacing with activity.;Increase knowledge of respiratory medications and ability to use respiratory devices properly.;Improve shortness of breath with ADL's     Review -- Giliana will begin exercising 04/05/2020, unable to accomplish goals until she begins program. Katee begins exercising in pulmonary rehab 04/05/2020.     Expected Outcomes -- See admission goals. See admission goals.            Core Components/Risk Factors/Patient Goals at Discharge (Final Review):   Goals and Risk Factor Review - 04/05/20 0904      Core Components/Risk Factors/Patient Goals Review   Personal Goals Review Develop more efficient breathing techniques such as purse lipped breathing and diaphragmatic breathing and practicing self-pacing with activity.;Increase knowledge of respiratory medications and ability to use respiratory devices properly.;Improve shortness of breath with ADL's    Review Sheyenne begins exercising in pulmonary rehab 04/05/2020.    Expected Outcomes See admission goals.           ITP Comments:   Comments: ITP REVIEW Pt is making expected progress toward pulmonary rehab goals after completing 0 sessions. Recommend continued exercise, life style modification, education, and utilization of breathing techniques to increase stamina and strength and decrease shortness of breath with exertion.

## 2020-04-05 NOTE — Progress Notes (Signed)
Daily Session Note  Patient Details  Name: Ardelia J Yontz MRN: [id number] Date of Birth: 06/24/1950 Referring Provider:   Flowsheet Row Pulmonary Rehab Walk Test from 03/28/2020 in [facility]  Referring Provider Dr. Dewald      Encounter Date: 04/05/2020  Check In:  Session Check In - 04/05/20 1103      Check-In   Supervising physician immediately available to respond to emergencies [facility] immediately available    Physician(s) Dr. Adhikari    Location [facility]    Staff Present Joan Behrens, RN, BSN;Lisa Hughes, RN;Jessica Martin, MS, ACSM-CEP, Exercise Physiologist    Virtual Visit No    Medication changes reported     No    Fall or balance concerns reported    No    Tobacco Cessation No Change    Warm-up and Cool-down Performed on first and last piece of equipment    Resistance Training Performed Yes    VAD Patient? No    PAD/SET Patient? No      Pain Assessment   Currently in Pain? No/denies    Multiple Pain Sites No           Capillary Blood Glucose: No results found for this or any previous visit (from the past 24 hour(s)).    Social History   Tobacco Use  Smoking Status Former Smoker  . Quit date: 01/09/2008  . Years since quitting: 12.2  Smokeless Tobacco Never Used    Goals Met:  Proper associated with RPD/PD & O2 Sat Exercise tolerated well Strength training completed today  Goals Unmet:  Not Applicable  Comments: Service time is from 1040 to 1150    Dr. Traci Turner is Medical Director for Cardiac Rehab at [facility]. 

## 2020-04-07 ENCOUNTER — Other Ambulatory Visit: Payer: Self-pay

## 2020-04-07 ENCOUNTER — Encounter (HOSPITAL_COMMUNITY)
Admission: RE | Admit: 2020-04-07 | Discharge: 2020-04-07 | Disposition: A | Discharge status: Home / Self Care | Payer: Medicare Other | Source: Ambulatory Visit | Attending: Pulmonary Disease | Admitting: Pulmonary Disease

## 2020-04-07 DIAGNOSIS — J439 Emphysema, unspecified: Secondary | ICD-10-CM | POA: Diagnosis not present

## 2020-04-07 NOTE — Progress Notes (Signed)
Daily Session Note  Patient Details  Name: Pamela Roth MRN: [id number] Date of Birth: 1950-06-01 Referring Provider:   April Manson Pulmonary Rehab Walk Test from 03/28/2020 in [place]  Referring Provider Dr. Erin Fulling      Encounter Date: 04/07/2020  Check In:  Session Check In - 04/07/20 1105      Check-In   Supervising physician immediately available to respond to emergencies [facility] immediately available    Physician(s) Dr. Florencia Reasons    [facility]    Staff Present Rosebud Poles, RN, Isaac Laud, MS, ACSM-CEP, Exercise Physiologist;Agnieszka Newhouse Ysidro Evert, RN    Virtual Visit No    Medication changes reported     No    Fall or balance concerns reported    No    Tobacco Cessation No Change    Warm-up and Cool-down Performed on first and last piece of equipment    Resistance Training Performed Yes    VAD Patient? No    PAD/SET Patient? No      Pain Assessment   Currently in Pain? No/denies    Multiple Pain Sites No           Capillary Blood Glucose: No results found for this or any previous visit (from the past 24 hour(s)).    Social History   Tobacco Use  Smoking Status Former Smoker  . Quit date: 01/09/2008  . Years since quitting: 12.2  Smokeless Tobacco Never Used    Goals Met:  Exercise tolerated well No report of cardiac concerns or symptoms Strength training completed today  Goals Unmet:  Not Applicable  Comments: Service time is from 1030 to 1132    Dr. Fransico Him is Medical Director for Cardiac Rehab at [facility].

## 2020-04-12 ENCOUNTER — Other Ambulatory Visit: Payer: Self-pay

## 2020-04-12 ENCOUNTER — Encounter (HOSPITAL_COMMUNITY)
Admission: RE | Admit: 2020-04-12 | Discharge: 2020-04-12 | Disposition: A | Discharge status: Home / Self Care | Payer: Medicare Other | Source: Ambulatory Visit | Attending: Pulmonary Disease | Admitting: Pulmonary Disease

## 2020-04-12 VITALS — Wt 175.9 lb

## 2020-04-12 DIAGNOSIS — J439 Emphysema, unspecified: Secondary | ICD-10-CM | POA: Diagnosis not present

## 2020-04-12 NOTE — Progress Notes (Signed)
Arletha Marschke Najjar 70 y.o. female Nutrition Note  Diagnosis: Emphysema  Past Medical History:  Diagnosis Date  . Anxiety   . COPD (chronic obstructive pulmonary disease) ([place])   . Depression   . Hyperlipidemia   . Hypertension   . Multinodular goiter      Medications reviewed.   Current Outpatient Medications:  .  acetaminophen (TYLENOL) 500 MG tablet, Take 1,000 mg by mouth every 6 (six) hours as needed (for hip pain or mild headaches). (Patient not taking: Reported on 03/28/2020), Disp: , Rfl:  .  albuterol (VENTOLIN HFA) 108 (90 Base) MCG/ACT inhaler, Inhale 2 puffs into the lungs every 6 (six) hours as needed for wheezing or shortness of breath. (Patient not taking: Reported on 03/28/2020), Disp: 6.7 g, Rfl: 0 .  Camphor-Menthol-Methyl Sal (SALONPAS EX), Place 1 patch onto the skin See admin instructions. Apply 1 patch one to two times a day as needed for hip pain (remove old patch first) (Patient not taking: Reported on 03/28/2020), Disp: , Rfl:  .  diclofenac Sodium (VOLTAREN) 1 % GEL, Apply topically., Disp: , Rfl:  .  DULoxetine (CYMBALTA) 30 MG capsule, Take 30 mg by mouth daily., Disp: , Rfl:  .  Fluticasone-Umeclidin-Vilant (TRELEGY ELLIPTA) 100-62.5-25 MCG/INH AEPB, Inhale 1 puff into the lungs daily., Disp: 28 each, Rfl: 6 .  Fluticasone-Umeclidin-Vilant (TRELEGY ELLIPTA) 100-62.5-25 MCG/INH AEPB, Inhale 1 puff into the lungs daily., Disp: 60 each, Rfl: 0 .  losartan (COZAAR) 50 MG tablet, Take 50 mg by mouth daily., Disp: , Rfl:  .  meloxicam (MOBIC) 7.5 MG tablet, Take 15 mg by mouth daily. (Patient not taking: Reported on 03/28/2020), Disp: , Rfl:  .  Multiple Vitamins-Minerals (ONE-A-DAY PROACTIVE 65+) TABS, Take 1 tablet by mouth daily with breakfast., Disp: , Rfl:  .  naphazoline-glycerin (CLEAR EYES REDNESS) 0.012-0.2 % SOLN, Place 1 drop into both eyes 4 (four) times daily as needed for eye irritation. (Patient not taking: Reported on 03/28/2020), Disp: , Rfl:  .   simvastatin (ZOCOR) 20 MG tablet, Take 20 mg by mouth daily. , Disp: , Rfl: 5 .  sodium chloride (OCEAN) 0.65 % SOLN nasal spray, Place 1 spray into both nostrils as needed for congestion. (Patient not taking: Reported on 03/28/2020), Disp: 30 mL, Rfl: 0   Ht Readings from Last 1 Encounters:  03/28/20 5\' 5"  (1.651 m)     Wt Readings from Last 3 Encounters:  03/28/20 175 lb 4.3 oz (79.5 kg)  03/18/20 179 lb 9.6 oz (81.5 kg)  01/13/20 177 lb (80.3 kg)     There is no height or weight on file to calculate BMI.   Social History   Tobacco Use  Smoking Status Former Smoker  . Quit date: 01/09/2008  . Years since quitting: 12.2  Smokeless Tobacco Never Used      Nutrition Note  Spoke with pt. Nutrition Plan and Nutrition Survey goals reviewed with pt.   Appetite: good Unintentional weight loss: none - reviewed weight hx. Pt states goal of 10 lb weight loss. She has gained about 10 lbs since she had covid due to not being able to exercise.  Difficulty eating: none - she only wears O2 with exertion. She wears it grocery shopping but not cooking.  Fluids: coffee with milk/sugar, water, 1-2 times per week sweet tea. Meals per day: 2 and snacks  Per discussion, pt does use canned/convenience foods often. Pt does add salt to food. Pt does not eat out frequently. She is shopping on  a budget. Discussed ways to buy healthier,  Budget friendly foods and how to prepare them at home. She already chooses lean meats, whole grains, and healthy fats.    Pt main goal is to increase nutrient density of foods and lose 5-10 lbs.  Pt expressed understanding of the information reviewed.    Nutrition Diagnosis ? Food-and nutrition-related knowledge deficit related to lack of exposure to information as related to diagnosis of: ? HLD, HTN and weight loss goals  Nutrition Intervention ? Pt's individual nutrition plan reviewed with pt. ? Label reading, portion control, nutrient dense foods  ? Continue  client-centered nutrition education by RD, as part of interdisciplinary care.  Goal(s) ? Pt to identify food quantities necessary to achieve weight loss of 5-10 lb at graduation from pulmonary rehab.  ? Pt to limit added sugas <25 g/day ? Pt to build a healthy plate including vegetables, fruits, whole grains, and low-fat dairy products in a heart healthy meal plan.  Plan:   Will provide client-centered nutrition education as part of interdisciplinary care  Monitor and evaluate progress toward nutrition goal with team.   Michaele Offer, MS, RDN, LDN

## 2020-04-12 NOTE — Progress Notes (Signed)
Daily Session Note  Patient Details  Name: Pamela Roth MRN: [id number] Date of Birth: 1950-03-28 Referring Provider:   April Manson Pulmonary Rehab Walk Test from 03/28/2020 in [place]  Referring Provider Dr. Erin Fulling      Encounter Date: 04/12/2020  Check In:  Session Check In - 04/12/20 1122      Check-In   Supervising physician immediately available to respond to emergencies [facility] immediately available    Physician(s) Dr. Tawanna Solo    [facility]    Staff Present Maurice Small, RN, BSN;Danine Hor Ysidro Evert, RN;Jessica Hassell Done, MS, ACSM-CEP, Exercise Physiologist    Virtual Visit No    Medication changes reported     No    Fall or balance concerns reported    No    Tobacco Cessation No Change    Warm-up and Cool-down Performed on first and last piece of equipment    Resistance Training Performed Yes    VAD Patient? No    PAD/SET Patient? No      Pain Assessment   Currently in Pain? No/denies    Pain Score 0-No pain    Multiple Pain Sites No           Capillary Blood Glucose: No results found for this or any previous visit (from the past 24 hour(s)).   Exercise Prescription Changes - 04/12/20 1200      Response to Exercise   Blood Pressure (Admit) 136/80    Blood Pressure (Exercise) 128/80    Blood Pressure (Exit) 140/80    Heart Rate (Admit) 81 bpm    Heart Rate (Exercise) 92 bpm    Heart Rate (Exit) 77 bpm    Oxygen Saturation (Admit) 99 %    Oxygen Saturation (Exercise) 94 %    Oxygen Saturation (Exit) 99 %    Rating of Perceived Exertion (Exercise) 10    Perceived Dyspnea (Exercise) 0    Duration Progress to 30 minutes of  aerobic without signs/symptoms of physical distress    Intensity Other (comment)   40-80% of HRR     Progression   Progression Continue to progress workloads to maintain intensity without signs/symptoms of physical distress.      Resistance Training   Training  Prescription Yes    Weight orange bands    Reps 10-15    Time 10 Minutes      NuStep   Level 2    SPM 80    Minutes 30    METs 2.1           Social History   Tobacco Use  Smoking Status Former Smoker  . Quit date: 01/09/2008  . Years since quitting: 12.2  Smokeless Tobacco Never Used    Goals Met:  Exercise tolerated well No report of cardiac concerns or symptoms Strength training completed today  Goals Unmet:  Not Applicable  Comments: Service time is from 1045 to 1147    Dr. Fransico Him is Medical Director for Cardiac Rehab at [facility].

## 2020-04-14 ENCOUNTER — Other Ambulatory Visit: Payer: Self-pay

## 2020-04-14 ENCOUNTER — Encounter (HOSPITAL_COMMUNITY)
Admission: RE | Admit: 2020-04-14 | Discharge: 2020-04-14 | Disposition: A | Discharge status: Home / Self Care | Payer: Medicare Other | Source: Ambulatory Visit | Attending: Pulmonary Disease | Admitting: Pulmonary Disease

## 2020-04-14 DIAGNOSIS — J439 Emphysema, unspecified: Secondary | ICD-10-CM

## 2020-04-14 NOTE — Progress Notes (Signed)
Daily Session Note  Patient Details  Name: Pamela Roth MRN: [id number] Date of Birth: Apr 01, 1950 Referring Provider:   April Manson Pulmonary Rehab Walk Test from 03/28/2020 in [place]  Referring Provider Dr. Erin Fulling      Encounter Date: 04/14/2020  Check In:  Session Check In - 04/14/20 1107      Check-In   Supervising physician immediately available to respond to emergencies [facility] immediately available    Physician(s) Dr. Pricilla Loveless    [facility]    Staff Present Rosebud Poles, RN, BSN;Briella Hobday Ysidro Evert, RN;Jessica Hassell Done, MS, ACSM-CEP, Exercise Physiologist    Virtual Visit No    Medication changes reported     No    Fall or balance concerns reported    No    Tobacco Cessation No Change    Warm-up and Cool-down Performed on first and last piece of equipment    Resistance Training Performed Yes    VAD Patient? No    PAD/SET Patient? No      Pain Assessment   Currently in Pain? No/denies    Multiple Pain Sites No           Capillary Blood Glucose: No results found for this or any previous visit (from the past 24 hour(s)).    Social History   Tobacco Use  Smoking Status Former Smoker  . Quit date: 01/09/2008  . Years since quitting: 12.2  Smokeless Tobacco Never Used    Goals Met:  Exercise tolerated well No report of cardiac concerns or symptoms Strength training completed today  Goals Unmet:  Not Applicable  Comments: Service time is from 1035 to 1130    Dr. Fransico Him is Medical Director for Cardiac Rehab at [facility].

## 2020-04-18 DIAGNOSIS — B948 Sequelae of other specified infectious and parasitic diseases: Secondary | ICD-10-CM | POA: Diagnosis not present

## 2020-04-18 DIAGNOSIS — E7800 Pure hypercholesterolemia, unspecified: Secondary | ICD-10-CM | POA: Diagnosis not present

## 2020-04-18 DIAGNOSIS — I10 Essential (primary) hypertension: Secondary | ICD-10-CM | POA: Diagnosis not present

## 2020-04-18 DIAGNOSIS — Z9981 Dependence on supplemental oxygen: Secondary | ICD-10-CM | POA: Diagnosis not present

## 2020-04-18 DIAGNOSIS — M545 Low back pain, unspecified: Secondary | ICD-10-CM | POA: Diagnosis not present

## 2020-04-18 DIAGNOSIS — J449 Chronic obstructive pulmonary disease, unspecified: Secondary | ICD-10-CM | POA: Diagnosis not present

## 2020-04-19 ENCOUNTER — Other Ambulatory Visit: Payer: Self-pay

## 2020-04-19 ENCOUNTER — Encounter (HOSPITAL_COMMUNITY)
Admission: RE | Admit: 2020-04-19 | Discharge: 2020-04-19 | Disposition: A | Discharge status: Home / Self Care | Payer: Medicare Other | Source: Ambulatory Visit | Attending: Pulmonary Disease | Admitting: Pulmonary Disease

## 2020-04-19 DIAGNOSIS — J439 Emphysema, unspecified: Secondary | ICD-10-CM | POA: Diagnosis not present

## 2020-04-19 NOTE — Progress Notes (Signed)
Daily Session Note  Patient Details  Name: Pamela Roth MRN: [id number] Date of Birth: 07/03/1950 Referring Provider:   April Manson Pulmonary Rehab Walk Test from 03/28/2020 in [place]  Referring Provider Dr. Erin Fulling      Encounter Date: 04/19/2020  Check In:  Session Check In - 04/19/20 1120      Check-In   Supervising physician immediately available to respond to emergencies [facility] immediately available    Physician(s) Dr. Tawanna Solo    [facility]    Staff Present Rosebud Poles, RN, Isaac Laud, MS, ACSM-CEP, Exercise Physiologist    Virtual Visit No    Medication changes reported     No    Fall or balance concerns reported    No    Tobacco Cessation No Change    Warm-up and Cool-down Performed on first and last piece of equipment    Resistance Training Performed Yes    VAD Patient? No    PAD/SET Patient? No      Pain Assessment   Currently in Pain? No/denies    Multiple Pain Sites No           Capillary Blood Glucose: No results found for this or any previous visit (from the past 24 hour(s)).    Social History   Tobacco Use  Smoking Status Former Smoker  . Quit date: 01/09/2008  . Years since quitting: 12.2  Smokeless Tobacco Never Used    Goals Met:  Proper associated with RPD/PD & O2 Sat Exercise tolerated well Strength training completed today  Goals Unmet:  Not Applicable  Comments: Service time is from 1035 to 1135    Dr. Fransico Him is Medical Director for Cardiac Rehab at [facility].

## 2020-04-21 ENCOUNTER — Other Ambulatory Visit: Payer: Self-pay

## 2020-04-21 ENCOUNTER — Encounter (HOSPITAL_COMMUNITY)
Admission: RE | Admit: 2020-04-21 | Discharge: 2020-04-21 | Disposition: A | Discharge status: Home / Self Care | Payer: Medicare Other | Source: Ambulatory Visit | Attending: Pulmonary Disease | Admitting: Pulmonary Disease

## 2020-04-21 DIAGNOSIS — M7062 Trochanteric bursitis, left hip: Secondary | ICD-10-CM | POA: Diagnosis not present

## 2020-04-21 DIAGNOSIS — M47816 Spondylosis without myelopathy or radiculopathy, lumbar region: Secondary | ICD-10-CM | POA: Diagnosis not present

## 2020-04-21 DIAGNOSIS — M7061 Trochanteric bursitis, right hip: Secondary | ICD-10-CM | POA: Diagnosis not present

## 2020-04-21 DIAGNOSIS — M545 Low back pain, unspecified: Secondary | ICD-10-CM | POA: Diagnosis not present

## 2020-04-21 DIAGNOSIS — G894 Chronic pain syndrome: Secondary | ICD-10-CM | POA: Diagnosis not present

## 2020-04-21 DIAGNOSIS — J439 Emphysema, unspecified: Secondary | ICD-10-CM | POA: Diagnosis not present

## 2020-04-21 DIAGNOSIS — M25551 Pain in right hip: Secondary | ICD-10-CM | POA: Diagnosis not present

## 2020-04-21 NOTE — Progress Notes (Signed)
Daily Session Note  Patient Details  Name: Pamela Roth MRN: [id number] Date of Birth: [birth year] Referring Provider:   April Manson Pulmonary Rehab Walk Test from 03/28/2020 in [place]  Referring Provider Dr. Erin Fulling      Encounter Date: 04/21/2020  Check In:  Session Check In - 04/21/20 1104      Check-In   Supervising physician immediately available to respond to emergencies [facility] immediately available    Physician(s) Dr. Florencia Reasons    [facility]    Staff Present Rosebud Poles, RN, BSN;Keyonta Barradas Ysidro Evert, RN;Jessica Hassell Done, MS, ACSM-CEP, Exercise Physiologist    Virtual Visit No    Medication changes reported     No    Fall or balance concerns reported    No    Tobacco Cessation No Change    Warm-up and Cool-down Performed on first and last piece of equipment    Resistance Training Performed Yes    VAD Patient? No    PAD/SET Patient? No      Pain Assessment   Currently in Pain? No/denies    Multiple Pain Sites No           Capillary Blood Glucose: No results found for this or any previous visit (from the past 24 hour(s)).    Social History   Tobacco Use  Smoking Status Former Smoker  . Quit date: 01/09/2008  . Years since quitting: 12.2  Smokeless Tobacco Never Used    Goals Met:  Exercise tolerated well No report of cardiac concerns or symptoms Strength training completed today  Goals Unmet:  Not Applicable  Comments: Service time is from 1035 to 1130    Dr. Fransico Him is Medical Director for Cardiac Rehab at [facility].

## 2020-04-22 ENCOUNTER — Other Ambulatory Visit: Payer: Self-pay | Admitting: Physical Medicine and Rehabilitation

## 2020-04-22 DIAGNOSIS — M545 Low back pain, unspecified: Secondary | ICD-10-CM

## 2020-04-26 ENCOUNTER — Other Ambulatory Visit: Payer: Self-pay

## 2020-04-26 ENCOUNTER — Encounter (HOSPITAL_COMMUNITY)
Admission: RE | Admit: 2020-04-26 | Discharge: 2020-04-26 | Disposition: A | Discharge status: Home / Self Care | Payer: Medicare Other | Source: Ambulatory Visit | Attending: Pulmonary Disease | Admitting: Pulmonary Disease

## 2020-04-26 VITALS — Wt 175.5 lb

## 2020-04-26 DIAGNOSIS — J439 Emphysema, unspecified: Secondary | ICD-10-CM

## 2020-04-26 DIAGNOSIS — M545 Low back pain, unspecified: Secondary | ICD-10-CM | POA: Diagnosis not present

## 2020-04-26 DIAGNOSIS — M25551 Pain in right hip: Secondary | ICD-10-CM | POA: Diagnosis not present

## 2020-04-26 NOTE — Progress Notes (Signed)
Pamela Roth have reviewed a Home Exercise Prescription with Pamela Roth . Pamela Roth is currently exercising at home. Pamela Roth states Pamela Roth walks, does the elliptical, and stretches a couple of times a week. The patient was advised to continue walking and to add resistance training an additional 2-3 days a week for 30-45 minutes.  Pamela Roth and Pamela Roth discussed how to progress their exercise prescription.  The patient stated that their goals were to lose weight and to get off of supplemental oxygen. The patient stated that they understand the exercise prescription.  We reviewed exercise guidelines, target heart rate during exercise, RPE Scale, weather conditions, NTG use, endpoints for exercise, warmup and cool down.  Patient is encouraged to come to me with any questions. Pamela Roth will continue to follow up with the patient to assist them with progression and safety.    Rick Duff MS, ACSM CEP 3:56 PM 04/26/2020

## 2020-04-26 NOTE — Progress Notes (Signed)
Daily Session Note  Patient Details  Name: Pamela Roth MRN: [id number] Date of Birth: [birth year] Referring Provider:   April Manson Pulmonary Rehab Walk Test from 03/28/2020 in [place]  Referring Provider Dr. Erin Fulling      Encounter Date: 04/26/2020  Check In:  Session Check In - 04/26/20 1108      Check-In   Supervising physician immediately available to respond to emergencies [facility] immediately available    Physician(s) Dr. Tawanna Solo    [facility]    Staff Present Rosebud Poles, RN, BSN;Ysabel Cowgill Ysidro Evert, RN;Jessica Hassell Done, MS, ACSM-CEP, Exercise Physiologist    Virtual Visit No    Medication changes reported     No    Fall or balance concerns reported    No    Tobacco Cessation No Change    Warm-up and Cool-down Performed on first and last piece of equipment    Resistance Training Performed Yes    VAD Patient? No    PAD/SET Patient? No      Pain Assessment   Currently in Pain? No/denies    Multiple Pain Sites No           Capillary Blood Glucose: No results found for this or any previous visit (from the past 24 hour(s)).   Exercise Prescription Changes - 04/26/20 1500      Response to Exercise   Blood Pressure (Admit) 124/80    Blood Pressure (Exercise) 122/66    Blood Pressure (Exit) 130/80    Heart Rate (Admit) 72 bpm    Heart Rate (Exercise) 105 bpm    Heart Rate (Exit) 85 bpm    Oxygen Saturation (Admit) 98 %    Oxygen Saturation (Exercise) 90 %    Oxygen Saturation (Exit) 96 %    Rating of Perceived Exertion (Exercise) 11    Perceived Dyspnea (Exercise) 1    Duration Continue with 30 min of aerobic exercise without signs/symptoms of physical distress.    Intensity THRR unchanged      Progression   Progression Continue to progress workloads to maintain intensity without signs/symptoms of physical distress.      Resistance Training   Training Prescription Yes    Weight orange bands     Reps 10-15    Time 10 Minutes      Treadmill   MPH 2.4    Grade 0    Minutes 15      NuStep   Level 3    SPM 80    Minutes 30    METs 1.7           Social History   Tobacco Use  Smoking Status Former Smoker  . Quit date: 01/09/2008  . Years since quitting: 12.3  Smokeless Tobacco Never Used    Goals Met:  Exercise tolerated well No report of cardiac concerns or symptoms Strength training completed today  Goals Unmet:  Not Applicable  Comments: Service time is from 1035 to 1147    Dr. Fransico Him is Medical Director for Cardiac Rehab at [facility].

## 2020-04-26 NOTE — Progress Notes (Signed)
Nutrition Note: Follow Up  Reviewed label reading with pt. Emphasized plate method for meal planning. Pt states wanting to lose weight. We talked about serving sizes, calories, appropriate fiber and protein intake for satiety, balanced meals. Pt prefers to eat small meals throughout the day.  Discussed importance of exercise.  Pt verbalizes understanding.  Michaele Offer, MS, RDN, LDN

## 2020-04-28 ENCOUNTER — Encounter (HOSPITAL_COMMUNITY)
Admission: RE | Admit: 2020-04-28 | Discharge: 2020-04-28 | Disposition: A | Discharge status: Home / Self Care | Payer: Medicare Other | Source: Ambulatory Visit | Attending: Pulmonary Disease | Admitting: Pulmonary Disease

## 2020-04-28 ENCOUNTER — Other Ambulatory Visit: Payer: Self-pay

## 2020-04-28 DIAGNOSIS — J439 Emphysema, unspecified: Secondary | ICD-10-CM | POA: Diagnosis not present

## 2020-04-28 NOTE — Progress Notes (Signed)
Daily Session Note  Patient Details  Name: Pamela Roth MRN: [id number] Date of Birth: 1950/11/20 Referring Provider:   April Manson Pulmonary Rehab Walk Test from 03/28/2020 in [place]  Referring Provider Dr. Erin Fulling      Encounter Date: 04/28/2020  Check In:  Session Check In - 04/28/20 1113      Check-In   Supervising physician immediately available to respond to emergencies [facility] immediately available    Physician(s) Dr. Tawanna Solo    Location [facility]    Staff Present Rosebud Poles, RN, BSN;Melitta Tigue Ysidro Evert, RN;Jessica Hassell Done, MS, ACSM-CEP, Exercise Physiologist    Virtual Visit No    Medication changes reported     No    Fall or balance concerns reported    No    Tobacco Cessation No Change    Warm-up and Cool-down Performed on first and last piece of equipment    Resistance Training Performed Yes    VAD Patient? No    PAD/SET Patient? No      Pain Assessment   Currently in Pain? No/denies    Multiple Pain Sites No           Capillary Blood Glucose: No results found for this or any previous visit (from the past 24 hour(s)).    Social History   Tobacco Use  Smoking Status Former Smoker  . Quit date: 01/09/2008  . Years since quitting: 12.3  Smokeless Tobacco Never Used    Goals Met:  Exercise tolerated well No report of cardiac concerns or symptoms Strength training completed today  Goals Unmet:  Not Applicable  Comments: Service time is from 1045 to 1140    Dr. Fransico Him is Medical Director for Cardiac Rehab at [facility].

## 2020-04-29 DIAGNOSIS — M25551 Pain in right hip: Secondary | ICD-10-CM | POA: Diagnosis not present

## 2020-04-29 DIAGNOSIS — M545 Low back pain, unspecified: Secondary | ICD-10-CM | POA: Diagnosis not present

## 2020-05-02 DIAGNOSIS — M545 Low back pain, unspecified: Secondary | ICD-10-CM | POA: Diagnosis not present

## 2020-05-02 DIAGNOSIS — M25551 Pain in right hip: Secondary | ICD-10-CM | POA: Diagnosis not present

## 2020-05-03 ENCOUNTER — Encounter (HOSPITAL_COMMUNITY)
Admission: RE | Admit: 2020-05-03 | Discharge: 2020-05-03 | Disposition: A | Discharge status: Home / Self Care | Payer: Medicare Other | Source: Ambulatory Visit | Attending: Pulmonary Disease | Admitting: Pulmonary Disease

## 2020-05-03 ENCOUNTER — Other Ambulatory Visit: Payer: Self-pay

## 2020-05-03 DIAGNOSIS — J439 Emphysema, unspecified: Secondary | ICD-10-CM | POA: Diagnosis not present

## 2020-05-03 NOTE — Progress Notes (Signed)
Daily Session Note  Patient Details  Name: Pamela Roth MRN: [id number] Date of Birth: [birth year] Referring Provider:   April Manson Pulmonary Rehab Walk Test from 03/28/2020 in [place]  Referring Provider Dr. Erin Fulling      Encounter Date: 05/03/2020  Check In:  Session Check In - 05/03/20 1136      Check-In   Supervising physician immediately available to respond to emergencies [facility] immediately available    Physician(s) Dr. Pricilla Loveless    Location [facility]    Staff Present Rosebud Poles, RN, BSN;Lisa Ysidro Evert, RN;Jessica Hassell Done, MS, ACSM-CEP, Exercise Physiologist    Virtual Visit No    Medication changes reported     No    Fall or balance concerns reported    No    Tobacco Cessation No Change    Warm-up and Cool-down Performed on first and last piece of equipment    Resistance Training Performed Yes    VAD Patient? No    PAD/SET Patient? No      Pain Assessment   Currently in Pain? No/denies    Multiple Pain Sites No           Capillary Blood Glucose: No results found for this or any previous visit (from the past 24 hour(s)).    Social History   Tobacco Use  Smoking Status Former Smoker  . Quit date: 01/09/2008  . Years since quitting: 12.3  Smokeless Tobacco Never Used    Goals Met:  Proper associated with RPD/PD & O2 Sat Exercise tolerated well Strength training completed today  Goals Unmet:  Not Applicable  Comments: Service time is from 0950 to 1045.   Dr. Fransico Him is Medical Director for Cardiac Rehab at [facility].

## 2020-05-03 NOTE — Progress Notes (Signed)
Pulmonary Individual Treatment Plan  Patient Details  Name: Pamela Roth MRN: [id number] Date of Birth: 1950/08/18 Referring Provider:   April Manson Pulmonary Rehab Walk Test from 03/28/2020 in [place]  Referring Provider Dr. Erin Fulling      Initial Encounter Date:  Flowsheet Row Pulmonary Rehab Walk Test from 03/28/2020 in [place]  Date 03/28/20      Visit Diagnosis: Pulmonary emphysema, unspecified emphysema type ([place])  Patient's Home Medications on Admission:   Current Outpatient Medications:  .  acetaminophen (TYLENOL) 500 MG tablet, Take 1,000 mg by mouth every 6 (six) hours as needed (for hip pain or mild headaches). (Patient not taking: Reported on 03/28/2020), Disp: , Rfl:  .  albuterol (VENTOLIN HFA) 108 (90 Base) MCG/ACT inhaler, Inhale 2 puffs into the lungs every 6 (six) hours as needed for wheezing or shortness of breath. (Patient not taking: Reported on 03/28/2020), Disp: 6.7 g, Rfl: 0 .  Camphor-Menthol-Methyl Sal (SALONPAS EX), Place 1 patch onto the skin See admin instructions. Apply 1 patch one to two times a day as needed for hip pain (remove old patch first) (Patient not taking: Reported on 03/28/2020), Disp: , Rfl:  .  diclofenac Sodium (VOLTAREN) 1 % GEL, Apply topically., Disp: , Rfl:  .  DULoxetine (CYMBALTA) 30 MG capsule, Take 30 mg by mouth daily., Disp: , Rfl:  .  Fluticasone-Umeclidin-Vilant (TRELEGY ELLIPTA) 100-62.5-25 MCG/INH AEPB, Inhale 1 puff into the lungs daily., Disp: 28 each, Rfl: 6 .  losartan (COZAAR) 50 MG tablet, Take 50 mg by mouth daily., Disp: , Rfl:  .  meloxicam (MOBIC) 7.5 MG tablet, Take 15 mg by mouth daily. (Patient not taking: Reported on 03/28/2020), Disp: , Rfl:  .  Multiple Vitamins-Minerals (ONE-A-DAY PROACTIVE 65+) TABS, Take 1 tablet by mouth daily with breakfast., Disp: , Rfl:  .  naphazoline-glycerin (CLEAR EYES REDNESS) 0.012-0.2 % SOLN, Place 1 drop into both  eyes 4 (four) times daily as needed for eye irritation. (Patient not taking: Reported on 03/28/2020), Disp: , Rfl:  .  simvastatin (ZOCOR) 20 MG tablet, Take 20 mg by mouth daily. , Disp: , Rfl: 5 .  sodium chloride (OCEAN) 0.65 % SOLN nasal spray, Place 1 spray into both nostrils as needed for congestion. (Patient not taking: Reported on 03/28/2020), Disp: 30 mL, Rfl: 0  Past Medical History: Past Medical History:  Diagnosis Date  . Anxiety   . COPD (chronic obstructive pulmonary disease) ([place])   . Depression   . Hyperlipidemia   . Hypertension   . Multinodular goiter     Tobacco Use: Social History   Tobacco Use  Smoking Status Former Smoker  . Quit date: 01/09/2008  . Years since quitting: 12.3  Smokeless Tobacco Never Used    Labs: Recent Review Scientist, physiological    Labs for ITP Cardiac and Pulmonary Rehab Latest Ref Rng & Units 10/27/2006 10/28/2006 10/28/2019   Cholestrol - - 167 ... -   LDLCALC - - 105 .Marland Kitchen.(H) -   HDL - - 57 -   Trlycerides <150 mg/dL - 25 69   PHART - 7.288(L) 7.387 -   PCO2ART - 46.7(H) 44.2 -   HCO3 - 21.6 26.2(H) -   TCO2 - 19.6 23.5 -   ACIDBASEDEF - 4.7(H) - -   O2SAT - 97.4 98.1 -      Capillary Blood Glucose: No results found for: GLUCAP   Pulmonary Assessment Scores:  Pulmonary Assessment Scores  [place] Name 03/28/20 1201 03/28/20 1551       ADL UCSD   ADL Phase Entry --    SOB Score total 31 --         CAT Score   CAT Score 13 --         mMRC Score   mMRC Score -- 1          UCSD: Self-administered rating of dyspnea associated with activities of daily living (ADLs) 6-point scale (0 = "not at all" to 5 = "maximal or unable to do because of breathlessness")  Scoring Scores range from 0 to 120.  Minimally important difference is 5 units  CAT: CAT can identify the health impairment of COPD patients and is better correlated with disease progression.  CAT has a scoring range of zero to 40. The CAT score is classified into four  groups of low (less than 10), medium (10 - 20), high (21-30) and very high (31-40) based on the impact level of disease on health status. A CAT score over 10 suggests significant symptoms.  A worsening CAT score could be explained by an exacerbation, poor medication adherence, poor inhaler technique, or progression of COPD or comorbid conditions.  CAT MCID is 2 points  mMRC: mMRC (Modified Medical Research Council) Dyspnea Scale is used to assess the degree of baseline functional disability in patients of respiratory disease due to dyspnea. No minimal important difference is established. A decrease in score of 1 point or greater is considered a positive change.   Pulmonary Function Assessment:  Pulmonary Function Assessment - 03/28/20 1214      Breath   Bilateral Breath Sounds Clear    Shortness of Breath Yes;Limiting activity           Exercise Target Goals: Exercise Program Goal: Individual exercise prescription set using results from initial 6 min walk test and THRR while considering  patient's activity barriers and safety.   Exercise Prescription Goal: Initial exercise prescription builds to 30-45 minutes a day of aerobic activity, 2-3 days per week.  Home exercise guidelines will be given to patient during program as part of exercise prescription that the participant will acknowledge.  Activity Barriers & Risk Stratification:  Activity Barriers & Cardiac Risk Stratification - 03/28/20 1139      Activity Barriers & Cardiac Risk Stratification   Activity Barriers Arthritis;Back Problems;Right Hip Replacement;Joint Problems;Deconditioning;Muscular Weakness;Shortness of Breath           6 Minute Walk:  6 Minute Walk    Row Name 03/28/20 1439         6 Minute Walk   Phase Initial     Distance 1356 feet     Walk Time 6 minutes     # of Rest Breaks 0     MPH 2.57     METS 3.35     RPE 9     Perceived Dyspnea  0     VO2 Peak 11.74     Symptoms No     Resting HR 75 bpm      Resting BP 148/88     Resting Oxygen Saturation  93 %     Exercise Oxygen Saturation  during 6 min walk 86 %     Max Ex. HR 99 bpm     Max Ex. BP 152/86     2 Minute Post BP 140/84           Interval HR   1 Minute HR 90  2 Minute HR 99     3 Minute HR 98     4 Minute HR 95     5 Minute HR 91     6 Minute HR 97     2 Minute Post HR 67     Interval Heart Rate? Yes           Interval Oxygen   Interval Oxygen? Yes     Baseline Oxygen Saturation % 93 %     1 Minute Oxygen Saturation % 92 %     1 Minute Liters of Oxygen 0 L     2 Minute Oxygen Saturation % 89 %     2 Minute Liters of Oxygen 0 L     3 Minute Oxygen Saturation % 87 %     3 Minute Liters of Oxygen 1 L     4 Minute Oxygen Saturation % 91 %     4 Minute Liters of Oxygen 1 L     5 Minute Oxygen Saturation % 89 %     5 Minute Liters of Oxygen 1 L     6 Minute Oxygen Saturation % 86 %     6 Minute Liters of Oxygen 1 L     2 Minute Post Oxygen Saturation % 98 %     2 Minute Post Liters of Oxygen 1 L            Oxygen Initial Assessment:  Oxygen Initial Assessment - 03/28/20 1213      Home Oxygen   Home Oxygen Device Portable Concentrator;E-Tanks    Sleep Oxygen Prescription None    Home Exercise Oxygen Prescription Continuous    Liters per minute 1    Home Resting Oxygen Prescription None    Compliance with Home Oxygen Use Yes      Initial 6 min Walk   Oxygen Used Continuous    Liters per minute 1      Program Oxygen Prescription   Program Oxygen Prescription Continuous    Liters per minute -1    Comments Pt may need 2L of oxygen for walking longer distances. Started her on RA for 6 minute walk test, she desaturated to 86/87% right before minute 3. Put her on 1L and she started to drop again below 87% around minute 5 and 6. Will monitor oxygen needs with specific exercises.      Intervention   Short Term Goals To learn and exhibit compliance with exercise, home and travel O2 prescription;To  learn and understand importance of monitoring SPO2 with pulse oximeter and demonstrate accurate use of the pulse oximeter.;To learn and understand importance of maintaining oxygen saturations>88%;To learn and demonstrate proper pursed lip breathing techniques or other breathing techniques.;To learn and demonstrate proper use of respiratory medications    Long  Term Goals Maintenance of O2 saturations>88%;Exhibits compliance with exercise, home and travel O2 prescription;Verbalizes importance of monitoring SPO2 with pulse oximeter and return demonstration;Exhibits proper breathing techniques, such as pursed lip breathing or other method taught during program session;Compliance with respiratory medication;Demonstrates proper use of MDI's           Oxygen Re-Evaluation:  Oxygen Re-Evaluation    Row Name 04/05/20 0832 04/29/20 1555           Program Oxygen Prescription   Program Oxygen Prescription Continuous Continuous      Liters per minute 1 1      Comments Pt may need 2L of oxygen for walking longer distances. Started her  on RA for 6 minute walk test, she desaturated to 86/87% right before minute 3. Put her on 1L and she started to drop again below 87% around minute 5 and 6. Will monitor oxygen needs with specific exercises. Pt has only required 1L ox oxygen with seated exercise and with walking.             Home Oxygen   Home Oxygen Device Portable Concentrator;E-Tanks Portable Concentrator;E-Tanks      Sleep Oxygen Prescription None None      Home Exercise Oxygen Prescription Continuous Continuous      Liters per minute 1 1      Home Resting Oxygen Prescription None None      Compliance with Home Oxygen Use Yes Yes             Goals/Expected Outcomes   Short Term Goals To learn and exhibit compliance with exercise, home and travel O2 prescription;To learn and understand importance of monitoring SPO2 with pulse oximeter and demonstrate accurate use of the pulse oximeter.;To learn and  understand importance of maintaining oxygen saturations>88%;To learn and demonstrate proper pursed lip breathing techniques or other breathing techniques.;To learn and demonstrate proper use of respiratory medications To learn and exhibit compliance with exercise, home and travel O2 prescription;To learn and understand importance of monitoring SPO2 with pulse oximeter and demonstrate accurate use of the pulse oximeter.;To learn and understand importance of maintaining oxygen saturations>88%;To learn and demonstrate proper pursed lip breathing techniques or other breathing techniques.;To learn and demonstrate proper use of respiratory medications      Long  Term Goals Maintenance of O2 saturations>88%;Exhibits compliance with exercise, home and travel O2 prescription;Verbalizes importance of monitoring SPO2 with pulse oximeter and return demonstration;Exhibits proper breathing techniques, such as pursed lip breathing or other method taught during program session;Compliance with respiratory medication;Demonstrates proper use of MDI's Maintenance of O2 saturations>88%;Exhibits compliance with exercise, home and travel O2 prescription;Verbalizes importance of monitoring SPO2 with pulse oximeter and return demonstration;Exhibits proper breathing techniques, such as pursed lip breathing or other method taught during program session;Compliance with respiratory medication;Demonstrates proper use of MDI's      Comments -- Pt's goal is to not have to rely on supplemental oxygen with exertion. She does not require it at rest, but currently needs it with exertion. At some point if we think this is ideal, based on oxygen saturation levels we are receiving, we will try room air with exertion.      Goals/Expected Outcomes compliance and understanding of oxygen saturation and pursed lip breathing. compliance and understanding of oxygen saturation and pursed lip breathing.             Oxygen Discharge (Final Oxygen  Re-Evaluation):  Oxygen Re-Evaluation - 04/29/20 1555      Program Oxygen Prescription   Program Oxygen Prescription Continuous    Liters per minute 1    Comments Pt has only required 1L ox oxygen with seated exercise and with walking.      Home Oxygen   Home Oxygen Device Portable Concentrator;E-Tanks    Sleep Oxygen Prescription None    Home Exercise Oxygen Prescription Continuous    Liters per minute 1    Home Resting Oxygen Prescription None    Compliance with Home Oxygen Use Yes      Goals/Expected Outcomes   Short Term Goals To learn and exhibit compliance with exercise, home and travel O2 prescription;To learn and understand importance of monitoring SPO2 with pulse oximeter and demonstrate accurate use of  the pulse oximeter.;To learn and understand importance of maintaining oxygen saturations>88%;To learn and demonstrate proper pursed lip breathing techniques or other breathing techniques.;To learn and demonstrate proper use of respiratory medications    Long  Term Goals Maintenance of O2 saturations>88%;Exhibits compliance with exercise, home and travel O2 prescription;Verbalizes importance of monitoring SPO2 with pulse oximeter and return demonstration;Exhibits proper breathing techniques, such as pursed lip breathing or other method taught during program session;Compliance with respiratory medication;Demonstrates proper use of MDI's    Comments Pt's goal is to not have to rely on supplemental oxygen with exertion. She does not require it at rest, but currently needs it with exertion. At some point if we think this is ideal, based on oxygen saturation levels we are receiving, we will try room air with exertion.    Goals/Expected Outcomes compliance and understanding of oxygen saturation and pursed lip breathing.           Initial Exercise Prescription:  Initial Exercise Prescription - 03/28/20 1400      Date of Initial Exercise RX and Referring Provider   Date 03/28/20     Referring Provider Dr. Erin Fulling    Expected Discharge Date 06/02/20      Treadmill   MPH 2.2    Grade 0    Minutes 15      NuStep   Level 1    SPM 80    Minutes 15      Prescription Details   Frequency (times per week) 2    Duration Progress to 30 minutes of continuous aerobic without signs/symptoms of physical distress      Intensity   THRR 40-80% of Max Heartrate 60-119    Ratings of Perceived Exertion 11-13    Perceived Dyspnea 0-4      Progression   Progression Continue to progress workloads to maintain intensity without signs/symptoms of physical distress.      Resistance Training   Training Prescription Yes    Weight orange bands    Reps 10-15           Perform Capillary Blood Glucose checks as needed.  Exercise Prescription Changes:  Exercise Prescription Changes    Row Name 04/12/20 1200 04/26/20 1500           Response to Exercise   Blood Pressure (Admit) 136/80 124/80      Blood Pressure (Exercise) 128/80 122/66      Blood Pressure (Exit) 140/80 130/80      Heart Rate (Admit) 81 bpm 72 bpm      Heart Rate (Exercise) 92 bpm 105 bpm      Heart Rate (Exit) 77 bpm 85 bpm      Oxygen Saturation (Admit) 99 % 98 %      Oxygen Saturation (Exercise) 94 % 90 %      Oxygen Saturation (Exit) 99 % 96 %      Rating of Perceived Exertion (Exercise) 10 11      Perceived Dyspnea (Exercise) 0 1      Duration Progress to 30 minutes of  aerobic without signs/symptoms of physical distress Continue with 30 min of aerobic exercise without signs/symptoms of physical distress.      Intensity Other (comment)  40-80% of HRR THRR unchanged             Progression   Progression Continue to progress workloads to maintain intensity without signs/symptoms of physical distress. Continue to progress workloads to maintain intensity without signs/symptoms of physical distress.  Resistance Training   Training Prescription Yes Yes      Weight orange bands orange bands       Reps 10-15 10-15      Time 10 Minutes 10 Minutes             Treadmill   MPH -- 2.4      Grade -- 0      Minutes -- 15             NuStep   Level 2 3      SPM 80 80      Minutes 30 30      METs 2.1 1.7             Home Exercise Plan   Plans to continue exercise at -- Home (comment)  Elliptical, Walking, and Stretching      Frequency -- Add 3 additional days to program exercise sessions.      Initial Home Exercises Provided -- 04/26/20             Exercise Comments:  Exercise Comments    Row Name 04/07/20 1644 04/26/20 1548         Exercise Comments Pt completed first day of exercise and tolerated well with no complaints or concerns. She was able to do 15 minutes on the Nustep and 15 minutes on the treadmill. She also completed 10 minutes of resistance training with no mobility issues. Will continue to monitor. Completed home exercise with pt. Pt states she does exercise at home on a regular basis. She walks and does stretches. We discussed how to progress home exercise prescription and she was receptive.             Exercise Goals and Review:  Exercise Goals    Row Name 03/28/20 1435             Exercise Goals   Increase Physical Activity Yes       Intervention Provide advice, education, support and counseling about physical activity/exercise needs.;Develop an individualized exercise prescription for aerobic and resistive training based on initial evaluation findings, risk stratification, comorbidities and participant's personal goals.       Expected Outcomes Short Term: Attend rehab on a regular basis to increase amount of physical activity.;Long Term: Add in home exercise to make exercise part of routine and to increase amount of physical activity.;Long Term: Exercising regularly at least 3-5 days a week.       Increase Strength and Stamina Yes       Intervention Provide advice, education, support and counseling about physical activity/exercise needs.;Develop  an individualized exercise prescription for aerobic and resistive training based on initial evaluation findings, risk stratification, comorbidities and participant's personal goals.       Expected Outcomes Short Term: Increase workloads from initial exercise prescription for resistance, speed, and METs.;Short Term: Perform resistance training exercises routinely during rehab and add in resistance training at home;Long Term: Improve cardiorespiratory fitness, muscular endurance and strength as measured by increased METs and functional capacity (6MWT)       Able to understand and use rate of perceived exertion (RPE) scale Yes       Intervention Provide education and explanation on how to use RPE scale       Expected Outcomes Short Term: Able to use RPE daily in rehab to express subjective intensity level;Long Term:  Able to use RPE to guide intensity level when exercising independently       Able to understand and  use Dyspnea scale Yes       Intervention Provide education and explanation on how to use Dyspnea scale       Expected Outcomes Short Term: Able to use Dyspnea scale daily in rehab to express subjective sense of shortness of breath during exertion;Long Term: Able to use Dyspnea scale to guide intensity level when exercising independently       Knowledge and understanding of Target Heart Rate Range (THRR) Yes       Intervention Provide education and explanation of THRR including how the numbers were predicted and where they are located for reference       Expected Outcomes Short Term: Able to state/look up THRR;Long Term: Able to use THRR to govern intensity when exercising independently;Short Term: Able to use daily as guideline for intensity in rehab       Understanding of Exercise Prescription Yes       Intervention Provide education, explanation, and written materials on patient's individual exercise prescription       Expected Outcomes Short Term: Able to explain program exercise  prescription;Long Term: Able to explain home exercise prescription to exercise independently              Exercise Goals Re-Evaluation :  Exercise Goals Re-Evaluation    Row Name 04/05/20 0830 04/29/20 1549           Exercise Goal Re-Evaluation   Exercise Goals Review Increase Physical Activity;Increase Strength and Stamina;Able to understand and use rate of perceived exertion (RPE) scale;Able to understand and use Dyspnea scale;Knowledge and understanding of Target Heart Rate Range (THRR);Understanding of Exercise Prescription Increase Physical Activity;Increase Strength and Stamina;Able to understand and use rate of perceived exertion (RPE) scale;Able to understand and use Dyspnea scale;Knowledge and understanding of Target Heart Rate Range (THRR);Understanding of Exercise Prescription      Comments Pt starts exercise this week and is too soon to note any progression. Will continue to monitor and follow. Pamela Roth has completed 8 exercises sessions and has been consistent with workload and MET level increases. She is very motived to get better and to not have to rely on supplemental oxygen. She is exercising at 2.4 METS on the Nustep and 3.0 METS on the treadmill. Will continue to monitor and progress as she is able.      Expected Outcomes Through exercise at rehab and home, the patient will decrease shortness of breath with daily activities and feel confident in carrying out an exercise regimn at home. Through exercise at rehab and home, the patient will decrease shortness of breath with daily activities and feel confident in carrying out an exercise regimn at home.             Discharge Exercise Prescription (Final Exercise Prescription Changes):  Exercise Prescription Changes - 04/26/20 1500      Response to Exercise   Blood Pressure (Admit) 124/80    Blood Pressure (Exercise) 122/66    Blood Pressure (Exit) 130/80    Heart Rate (Admit) 72 bpm    Heart Rate (Exercise) 105 bpm     Heart Rate (Exit) 85 bpm    Oxygen Saturation (Admit) 98 %    Oxygen Saturation (Exercise) 90 %    Oxygen Saturation (Exit) 96 %    Rating of Perceived Exertion (Exercise) 11    Perceived Dyspnea (Exercise) 1    Duration Continue with 30 min of aerobic exercise without signs/symptoms of physical distress.    Intensity THRR unchanged  Progression   Progression Continue to progress workloads to maintain intensity without signs/symptoms of physical distress.      Resistance Training   Training Prescription Yes    Weight orange bands    Reps 10-15    Time 10 Minutes      Treadmill   MPH 2.4    Grade 0    Minutes 15      NuStep   Level 3    SPM 80    Minutes 30    METs 1.7      Home Exercise Plan   Plans to continue exercise at Home (comment)   Elliptical, Walking, and Stretching   Frequency Add 3 additional days to program exercise sessions.    Initial Home Exercises Provided 04/26/20           Nutrition:  Target Goals: Understanding of nutrition guidelines, daily intake of sodium '1500mg'$ , cholesterol '200mg'$ , calories 30% from fat and 7% or less from saturated fats, daily to have 5 or more servings of fruits and vegetables.  Biometrics:  Pre Biometrics - 03/28/20 1140      Pre Biometrics   Grip Strength 30.5 kg            Nutrition Therapy Plan and Nutrition Goals:  Nutrition Therapy & Goals - 04/12/20 1159      Nutrition Therapy   Diet TLC    Drug/Food Interactions Statins/Certain Fruits      Personal Nutrition Goals   Nutrition Goal Pt to identify food quantities necessary to achieve weight loss of 5-10 lb at graduation from pulmonary rehab.    Personal Goal #2 Pt to limit added sugas <25 g/day    Personal Goal #3 Pt to build a healthy Roth including vegetables, fruits, whole grains, and low-fat dairy products in a heart healthy meal plan.      Intervention Plan   Intervention Prescribe, educate and counsel regarding individualized specific dietary  modifications aiming towards targeted core components such as weight, hypertension, lipid management, diabetes, heart failure and other comorbidities.;Nutrition handout(s) given to patient.    Expected Outcomes Short Term Goal: Understand basic principles of dietary content, such as calories, fat, sodium, cholesterol and nutrients.;Long Term Goal: Adherence to prescribed nutrition plan.           Nutrition Assessments:  MEDIFICTS Score Key:  ?70 Need to make dietary changes   40-70 Heart Healthy Diet  ? 40 Therapeutic Level Cholesterol Diet  Flowsheet Row PULMONARY REHAB OTHER RESPIRATORY from 04/12/2020 in [place]  Picture Your Roth Total Score on Admission 53     Picture Your Roth Scores:  <16 Unhealthy dietary pattern with much room for improvement.  41-50 Dietary pattern unlikely to meet recommendations for good health and room for improvement.  51-60 More healthful dietary pattern, with some room for improvement.   >60 Healthy dietary pattern, although there may be some specific behaviors that could be improved.    Nutrition Goals Re-Evaluation:  Nutrition Goals Re-Evaluation    [place] Name 04/12/20 1200 04/28/20 1447           Goals   Current Weight 175 lb (79.4 kg) 175 lb 14.8 oz (79.8 kg)      Nutrition Goal Pt to identify food quantities necessary to achieve weight loss of 5-10 lb at graduation from pulmonary rehab. Pt to identify food quantities necessary to achieve weight loss of 5-10 lb at graduation from pulmonary rehab.      Comment -- Reviewed  label reading with pt             Personal Goal #2 Re-Evaluation   Personal Goal #2 Pt to limit added sugas <25 g/day Pt to limit added sugars <25 g/day             Personal Goal #3 Re-Evaluation   Personal Goal #3 Pt to build a healthy Roth including vegetables, fruits, whole grains, and low-fat dairy products in a heart healthy meal plan. Pt to build a healthy Roth including  vegetables, fruits, whole grains, and low-fat dairy products in a heart healthy meal plan.             Nutrition Goals Discharge (Final Nutrition Goals Re-Evaluation):  Nutrition Goals Re-Evaluation - 04/28/20 1447      Goals   Current Weight 175 lb 14.8 oz (79.8 kg)    Nutrition Goal Pt to identify food quantities necessary to achieve weight loss of 5-10 lb at graduation from pulmonary rehab.    Comment Reviewed label reading with pt      Personal Goal #2 Re-Evaluation   Personal Goal #2 Pt to limit added sugars <25 g/day      Personal Goal #3 Re-Evaluation   Personal Goal #3 Pt to build a healthy Roth including vegetables, fruits, whole grains, and low-fat dairy products in a heart healthy meal plan.           Psychosocial: Target Goals: Acknowledge presence or absence of significant depression and/or stress, maximize coping skills, provide positive support system. Participant is able to verbalize types and ability to use techniques and skills needed for reducing stress and depression.  Initial Review & Psychosocial Screening:  Initial Psych Review & Screening - 03/28/20 1202      Initial Review   Current issues with None Identified      Family Dynamics   Good Support System? Yes      Barriers   Psychosocial barriers to participate in program There are no identifiable barriers or psychosocial needs.      Screening Interventions   Interventions Encouraged to exercise           Quality of Life Scores:  Scores of 19 and below usually indicate a poorer quality of life in these areas.  A difference of  2-3 points is a clinically meaningful difference.  A difference of 2-3 points in the total score of the Quality of Life Index has been associated with significant improvement in overall quality of life, self-image, physical symptoms, and general health in studies assessing change in quality of life.  PHQ-9: Recent Review Flowsheet Data    Depression screen [facility] 2/9  03/28/2020   Decreased Interest 0   Down, Depressed, Hopeless 0   PHQ - 2 Score 0   Altered sleeping 0   Tired, decreased energy 1   Change in appetite 0   Feeling bad or failure about yourself  0   Trouble concentrating 0   Moving slowly or fidgety/restless 0   Suicidal thoughts 0   PHQ-9 Score 1   Difficult doing work/chores Not difficult at all     Interpretation of Total Score  Total Score Depression Severity:  1-4 = Minimal depression, 5-9 = Mild depression, 10-14 = Moderate depression, 15-19 = Moderately severe depression, 20-27 = Severe depression   Psychosocial Evaluation and Intervention:  Psychosocial Evaluation - 03/28/20 1211      Psychosocial Evaluation & Interventions   Interventions Encouraged to exercise with the program and follow exercise  prescription    Comments No concerns identified    Expected Outcomes To continue to have no psychosocial concerns while participating in pulmonary rehab.    Continue Psychosocial Services  No Follow up required           Psychosocial Re-Evaluation:  Psychosocial Re-Evaluation    Peck Name 04/04/20 1221 04/05/20 0903 04/26/20 0908         Psychosocial Re-Evaluation   Current issues with None Identified None Identified None Identified     Comments No change since orientation/walk test 1 week ago. No psychosocial concerns identified at this time. No psychosocial concerns identified at this time.     Expected Outcomes To continue to be free of psychosocial concerns while participating in pulmonary rehab. For Pamela Roth to continue to be free of psychosocial concerns while participating in pulmonary rehab. For Pamela Roth to continue to be free of psychosocial concerns while participating in pulmonary rehab.     Interventions Encouraged to attend Pulmonary Rehabilitation for the exercise Encouraged to attend Pulmonary Rehabilitation for the exercise Encouraged to attend Pulmonary Rehabilitation for the exercise     Continue  Psychosocial Services  No Follow up required No Follow up required No Follow up required            Psychosocial Discharge (Final Psychosocial Re-Evaluation):  Psychosocial Re-Evaluation - 04/26/20 0908      Psychosocial Re-Evaluation   Current issues with None Identified    Comments No psychosocial concerns identified at this time.    Expected Outcomes For Pamela Roth to continue to be free of psychosocial concerns while participating in pulmonary rehab.    Interventions Encouraged to attend Pulmonary Rehabilitation for the exercise    Continue Psychosocial Services  No Follow up required           Education: Education Goals: Education classes will be provided on a weekly basis, covering required topics. Participant will state understanding/return demonstration of topics presented.  Learning Barriers/Preferences:  Learning Barriers/Preferences - 03/28/20 1213      Learning Barriers/Preferences   Learning Barriers None    Learning Preferences Computer/Internet;Written Material           Education Topics: Risk Factor Reduction:  -Group instruction that is supported by a PowerPoint presentation. Instructor discusses the definition of a risk factor, different risk factors for pulmonary disease, and how the heart and lungs work together.     Nutrition for Pulmonary Patient:  -Group instruction provided by PowerPoint slides, verbal discussion, and written materials to support subject matter. The instructor gives an explanation and review of healthy diet recommendations, which includes a discussion on weight management, recommendations for fruit and vegetable consumption, as well as protein, fluid, caffeine, fiber, sodium, sugar, and alcohol. Tips for eating when patients are short of breath are discussed.   Pursed Lip Breathing:  -Group instruction that is supported by demonstration and informational handouts. Instructor discusses the benefits of pursed lip and diaphragmatic  breathing and detailed demonstration on how to preform both.   Flowsheet Row PULMONARY REHAB OTHER RESPIRATORY from 04/28/2020 in [place]  Date 04/14/20      Oxygen Safety:  -Group instruction provided by PowerPoint, verbal discussion, and written material to support subject matter. There is an overview of "What is Oxygen" and "Why do we need it".  Instructor also reviews how to create a safe environment for oxygen use, the importance of using oxygen as prescribed, and the risks of noncompliance. There is a brief discussion on traveling  with oxygen and resources the patient may utilize.   Oxygen Equipment:  -Group instruction provided by [facility] Staff utilizing handouts, written materials, and equipment demonstrations.   Signs and Symptoms:  -Group instruction provided by written material and verbal discussion to support subject matter. Warning signs and symptoms of infection, stroke, and heart attack are reviewed and when to call the physician/911 reinforced. Tips for preventing the spread of infection discussed.   Advanced Directives:  -Group instruction provided by verbal instruction and written material to support subject matter. Instructor reviews Advanced Directive laws and proper instruction for filling out document.   Pulmonary Video:  -Group video education that reviews the importance of medication and oxygen compliance, exercise, good nutrition, pulmonary hygiene, and pursed lip and diaphragmatic breathing for the pulmonary patient.   Exercise for the Pulmonary Patient:  -Group instruction that is supported by a PowerPoint presentation. Instructor discusses benefits of exercise, core components of exercise, frequency, duration, and intensity of an exercise routine, importance of utilizing pulse oximetry during exercise, safety while exercising, and options of places to exercise outside of rehab.     Pulmonary Medications:  -Verbally  interactive group education provided by instructor with focus on inhaled medications and proper administration.   Anatomy and Physiology of the Respiratory System and Intimacy:  -Group instruction provided by PowerPoint, verbal discussion, and written material to support subject matter. Instructor reviews respiratory cycle and anatomical components of the respiratory system and their functions. Instructor also reviews differences in obstructive and restrictive respiratory diseases with examples of each. Intimacy, Sex, and Sexuality differences are reviewed with a discussion on how relationships can change when diagnosed with pulmonary disease. Common sexual concerns are reviewed.   MD DAY -A group question and answer session with a medical doctor that allows participants to ask questions that relate to their pulmonary disease state.   OTHER EDUCATION -Group or individual verbal, written, or video instructions that support the educational goals of the pulmonary rehab program. Pamela Roth from 04/28/2020 in [place]  Date 04/28/20  Educator Handout  Pamela Roth]      Holiday Eating Survival Tips:  -Group instruction provided by PowerPoint slides, verbal discussion, and written materials to support subject matter. The instructor gives patients tips, tricks, and techniques to help them not only survive but enjoy the holidays despite the onslaught of food that accompanies the holidays.   Knowledge Questionnaire Score:  Knowledge Questionnaire Score - 03/28/20 1205      Knowledge Questionnaire Score   Pre Score 16/18           Core Components/Risk Factors/Patient Goals at Admission:  Personal Goals and Risk Factors at Admission - 03/28/20 1205      Core Components/Risk Factors/Patient Goals on Admission   Improve shortness of breath with ADL's Yes    Intervention Provide education, individualized exercise plan and  daily activity instruction to help decrease symptoms of SOB with activities of daily living.    Expected Outcomes Short Term: Improve cardiorespiratory fitness to achieve a reduction of symptoms when performing ADLs;Long Term: Be able to perform more ADLs without symptoms or delay the onset of symptoms           Core Components/Risk Factors/Patient Goals Review:   Goals and Risk Factor Review    Row Name 03/28/20 1205 04/04/20 1222 04/05/20 0904 04/26/20 0909       Core Components/Risk Factors/Patient Goals Review   Personal Goals Review Develop more  efficient breathing techniques such as purse lipped breathing and diaphragmatic breathing and practicing self-pacing with activity.;Increase knowledge of respiratory medications and ability to use respiratory devices properly.;Improve shortness of breath with ADL's Develop more efficient breathing techniques such as purse lipped breathing and diaphragmatic breathing and practicing self-pacing with activity.;Increase knowledge of respiratory medications and ability to use respiratory devices properly.;Improve shortness of breath with ADL's Develop more efficient breathing techniques such as purse lipped breathing and diaphragmatic breathing and practicing self-pacing with activity.;Increase knowledge of respiratory medications and ability to use respiratory devices properly.;Improve shortness of breath with ADL's Develop more efficient breathing techniques such as purse lipped breathing and diaphragmatic breathing and practicing self-pacing with activity.;Increase knowledge of respiratory medications and ability to use respiratory devices properly.;Improve shortness of breath with ADL's    Review -- Marvina will begin exercising 04/05/2020, unable to accomplish goals until she begins program. Jesica begins exercising in pulmonary rehab 04/05/2020. 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    Expected Outcomes -- See admission goals. See admission goals. See admission goals.           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    Expected Outcomes See admission goals.           ITP Comments:   Comments: ITP REVIEW Pt is making expected progress toward pulmonary rehab goals after completing 8 sessions. Recommend continued exercise, life style modification, education, and utilization of breathing techniques to increase stamina and strength and decrease shortness of breath with exertion.

## 2020-05-05 ENCOUNTER — Other Ambulatory Visit: Payer: Self-pay

## 2020-05-05 ENCOUNTER — Encounter (HOSPITAL_COMMUNITY)
Admission: RE | Admit: 2020-05-05 | Discharge: 2020-05-05 | Disposition: A | Discharge status: Home / Self Care | Payer: Medicare Other | Source: Ambulatory Visit | Attending: Pulmonary Disease | Admitting: Pulmonary Disease

## 2020-05-05 DIAGNOSIS — M545 Low back pain, unspecified: Secondary | ICD-10-CM | POA: Diagnosis not present

## 2020-05-05 DIAGNOSIS — M25551 Pain in right hip: Secondary | ICD-10-CM | POA: Diagnosis not present

## 2020-05-05 DIAGNOSIS — J439 Emphysema, unspecified: Secondary | ICD-10-CM | POA: Diagnosis not present

## 2020-05-05 NOTE — Progress Notes (Signed)
Daily Session Note  Patient Details  Name: Mailyn J Pickar MRN: [id number] Date of Birth: 10/13/1950 Referring Provider:   Flowsheet Row Pulmonary Rehab Walk Test from 03/28/2020 in [facility]  Referring Provider Dr. Dewald      Encounter Date: 05/05/2020  Check In:  Session Check In - 05/05/20 1156      Check-In   Supervising physician immediately available to respond to emergencies [facility] immediately available    Physician(s) Dr. Amery    Location [facility]    Staff Present Joan Behrens, RN, BSN;Lisa Hughes, RN;David Makemson, MS, ACSM-CEP, CCRP, Exercise Physiologist    Virtual Visit No    Medication changes reported     No    Fall or balance concerns reported    No    Tobacco Cessation No Change    Warm-up and Cool-down Performed on first and last piece of equipment    Resistance Training Performed Yes    VAD Patient? No    PAD/SET Patient? No      Pain Assessment   Currently in Pain? No/denies    Multiple Pain Sites No           Capillary Blood Glucose: No results found for this or any previous visit (from the past 24 hour(s)).    Social History   Tobacco Use  Smoking Status Former Smoker  . Quit date: 01/09/2008  . Years since quitting: 12.3  Smokeless Tobacco Never Used    Goals Met:  Exercise tolerated well No report of cardiac concerns or symptoms Strength training completed today  Goals Unmet:  Not Applicable  Comments: Service time is from 1045 to 1134    Dr. Traci Turner is Medical Director for Cardiac Rehab at [facility]. 

## 2020-05-06 ENCOUNTER — Other Ambulatory Visit: Payer: Medicare Other

## 2020-05-07 ENCOUNTER — Ambulatory Visit
Admission: RE | Admit: 2020-05-07 | Discharge: 2020-05-07 | Disposition: A | Discharge status: Home / Self Care | Payer: Medicare Other | Source: Ambulatory Visit | Attending: Physical Medicine and Rehabilitation | Admitting: Physical Medicine and Rehabilitation

## 2020-05-07 DIAGNOSIS — M48061 Spinal stenosis, lumbar region without neurogenic claudication: Secondary | ICD-10-CM | POA: Diagnosis not present

## 2020-05-07 DIAGNOSIS — M545 Low back pain, unspecified: Secondary | ICD-10-CM

## 2020-05-10 ENCOUNTER — Encounter (HOSPITAL_COMMUNITY)
Admission: RE | Admit: 2020-05-10 | Discharge: 2020-05-10 | Disposition: A | Discharge status: Home / Self Care | Payer: Medicare Other | Source: Ambulatory Visit | Attending: Pulmonary Disease | Admitting: Pulmonary Disease

## 2020-05-10 ENCOUNTER — Other Ambulatory Visit: Payer: Self-pay

## 2020-05-10 VITALS — Wt 176.1 lb

## 2020-05-10 DIAGNOSIS — J439 Emphysema, unspecified: Secondary | ICD-10-CM

## 2020-05-10 NOTE — Progress Notes (Signed)
Daily Session Note  Patient Details  Name: Pamela Roth MRN: [id number] Date of Birth: 1950-08-16 Referring Provider:   April Manson Pulmonary Rehab Walk Test from 03/28/2020 in [place]  Referring Provider Dr. Erin Fulling      Encounter Date: 05/10/2020  Check In:  Session Check In - 05/10/20 1213      Check-In   Supervising physician immediately available to respond to emergencies [facility] immediately available    Physician(s) Dr. Kurtis Bushman    Location [facility]    Staff Present Rosebud Poles, RN, Isaac Laud, MS, ACSM-CEP, Exercise Physiologist;Annedrea Rosezella [place], RN, MHA;Carlette Wilber Oliphant, RN, BSN;Lisa Ysidro Evert, RN;Meredith Rosana Hoes RD, Jaclyn Shaggy, MS, ACSM-CEP, CCRP, Exercise Physiologist    Virtual Visit No    Medication changes reported     No    Fall or balance concerns reported    No    Tobacco Cessation No Change    Warm-up and Cool-down Performed on first and last piece of equipment    Resistance Training Performed Yes    VAD Patient? No    PAD/SET Patient? No      Pain Assessment   Currently in Pain? No/denies    Multiple Pain Sites No           Capillary Blood Glucose: No results found for this or any previous visit (from the past 24 hour(s)).   Exercise Prescription Changes - 05/10/20 1500      Response to Exercise   Blood Pressure (Admit) 114/74    Blood Pressure (Exercise) 130/70    Blood Pressure (Exit) 118/78    Heart Rate (Admit) 91 bpm    Heart Rate (Exercise) 107 bpm    Heart Rate (Exit) 87 bpm    Oxygen Saturation (Admit) 95 %    Oxygen Saturation (Exercise) 92 %    Oxygen Saturation (Exit) 97 %    Rating of Perceived Exertion (Exercise) 13    Perceived Dyspnea (Exercise) 1    Duration Continue with 30 min of aerobic exercise without signs/symptoms of physical distress.    Intensity THRR unchanged      Progression   Progression Continue to progress workloads to maintain  intensity without signs/symptoms of physical distress.      Resistance Training   Training Prescription Yes    Weight orange bands    Reps 10-15    Time 10 Minutes      Treadmill   MPH 2.6    Grade 1    Minutes 15      NuStep   Level 4    SPM 80    Minutes 15    METs 2.4           Social History   Tobacco Use  Smoking Status Former Smoker  . Quit date: 01/09/2008  . Years since quitting: 12.3  Smokeless Tobacco Never Used    Goals Met:  Proper associated with RPD/PD & O2 Sat Independence with exercise equipment Exercise tolerated well No report of cardiac concerns or symptoms Strength training completed today  Goals Unmet:  Not Applicable  Comments: Service time is from 1015 to 1125    Dr. Fransico Him is Medical Director for Cardiac Rehab at [facility].

## 2020-05-12 ENCOUNTER — Encounter (HOSPITAL_COMMUNITY): Payer: Medicare Other

## 2020-05-12 DIAGNOSIS — M25551 Pain in right hip: Secondary | ICD-10-CM | POA: Diagnosis not present

## 2020-05-12 DIAGNOSIS — M545 Low back pain, unspecified: Secondary | ICD-10-CM | POA: Diagnosis not present

## 2020-05-13 ENCOUNTER — Telehealth: Payer: Self-pay | Admitting: Pulmonary Disease

## 2020-05-13 DIAGNOSIS — J441 Chronic obstructive pulmonary disease with (acute) exacerbation: Secondary | ICD-10-CM

## 2020-05-13 NOTE — Telephone Encounter (Signed)
Called and spoke with pt and she stated that she needs an order sent over to [employer] for her to get an oxygen oximeter and a shoulder bag for the oxygen tank.    JD please advise on ordering these.  Thanks

## 2020-05-16 NOTE — Telephone Encounter (Signed)
Ok to order.   Thanks, Wille Glaser

## 2020-05-16 NOTE — Telephone Encounter (Signed)
I have placed the order per pts request.  Nothing further is needed.

## 2020-05-17 ENCOUNTER — Encounter (HOSPITAL_COMMUNITY)
Admission: RE | Admit: 2020-05-17 | Discharge: 2020-05-17 | Disposition: A | Discharge status: Home / Self Care | Payer: Medicare Other | Source: Ambulatory Visit | Attending: Pulmonary Disease | Admitting: Pulmonary Disease

## 2020-05-17 ENCOUNTER — Other Ambulatory Visit: Payer: Self-pay

## 2020-05-17 DIAGNOSIS — M545 Low back pain, unspecified: Secondary | ICD-10-CM | POA: Diagnosis not present

## 2020-05-17 DIAGNOSIS — J439 Emphysema, unspecified: Secondary | ICD-10-CM | POA: Diagnosis not present

## 2020-05-17 DIAGNOSIS — M25551 Pain in right hip: Secondary | ICD-10-CM | POA: Diagnosis not present

## 2020-05-17 NOTE — Progress Notes (Signed)
Daily Session Note  Patient Details  Name: Pamela Roth MRN: [id number] Date of Birth: [birth year] Referring Provider:   April Manson Pulmonary Rehab Walk Test from 03/28/2020 in [place]  Referring Provider Dr. Erin Fulling      Encounter Date: 05/17/2020  Check In:  Session Check In - 05/17/20 [place]      Check-In   Supervising physician immediately available to respond to emergencies [facility] immediately available    Physician(s) Dr. Arbutus Ped    [facility]    Staff Present Rosebud Poles, RN, Isaac Laud, MS, ACSM-CEP, Exercise Physiologist;Annedrea Rosezella [place], RN, MHA;Carlette Wilber Oliphant, RN, Milus Glazier, MS, ACSM-CEP, CCRP, Exercise Physiologist;Olinty Celesta Aver, MS, ACSM CEP, Exercise Physiologist    Virtual Visit No    Medication changes reported     No    Fall or balance concerns reported    No    Tobacco Cessation No Change    Warm-up and Cool-down Performed as group-led instruction    Resistance Training Performed Yes    VAD Patient? No    PAD/SET Patient? No      Pain Assessment   Currently in Pain? No/denies    Pain Score 0-No pain    Multiple Pain Sites No           Capillary Blood Glucose: No results found for this or any previous visit (from the past 24 hour(s)).    Social History   Tobacco Use  Smoking Status Former Smoker  . Quit date: 01/09/2008  . Years since quitting: 12.3  Smokeless Tobacco Never Used    Goals Met:  Proper associated with RPD/PD & O2 Sat Exercise tolerated well Strength training completed today  Goals Unmet:  Not Applicable  Comments: Service time is from 1015 to 62    Dr. Fransico Him is Medical Director for Cardiac Rehab at [facility].

## 2020-05-19 ENCOUNTER — Other Ambulatory Visit: Payer: Self-pay

## 2020-05-19 ENCOUNTER — Encounter (HOSPITAL_COMMUNITY)
Admission: RE | Admit: 2020-05-19 | Discharge: 2020-05-19 | Disposition: A | Discharge status: Home / Self Care | Payer: Medicare Other | Source: Ambulatory Visit | Attending: Pulmonary Disease | Admitting: Pulmonary Disease

## 2020-05-19 DIAGNOSIS — J439 Emphysema, unspecified: Secondary | ICD-10-CM | POA: Diagnosis not present

## 2020-05-19 DIAGNOSIS — G894 Chronic pain syndrome: Secondary | ICD-10-CM | POA: Diagnosis not present

## 2020-05-19 DIAGNOSIS — M7061 Trochanteric bursitis, right hip: Secondary | ICD-10-CM | POA: Diagnosis not present

## 2020-05-19 DIAGNOSIS — M47816 Spondylosis without myelopathy or radiculopathy, lumbar region: Secondary | ICD-10-CM | POA: Diagnosis not present

## 2020-05-19 DIAGNOSIS — M7062 Trochanteric bursitis, left hip: Secondary | ICD-10-CM | POA: Diagnosis not present

## 2020-05-19 NOTE — Progress Notes (Signed)
Daily Session Note  Patient Details  Name: Pamela Roth MRN: [id number] Date of Birth: [birth year] Referring Provider:   April Manson Pulmonary Rehab Walk Test from 03/28/2020 in [place]  Referring Provider Dr. Erin Fulling      Encounter Date: 05/19/2020  Check In:  Session Check In - 05/19/20 1159      Check-In   Supervising physician immediately available to respond to emergencies [facility] immediately available    Physician(s) Dr. Lonny Prude    Location [facility]    Staff Present Rosebud Poles, RN, Isaac Laud, MS, ACSM-CEP, Exercise Physiologist;Carlette Wilber Oliphant, RN, Milus Glazier, MS, ACSM-CEP, CCRP, Exercise Physiologist;Annedrea Rosezella [place], RN, [place]    Virtual Visit No    Medication changes reported     No    Fall or balance concerns reported    No    Tobacco Cessation No Change    Warm-up and Cool-down Performed as group-led instruction    Resistance Training Performed Yes    VAD Patient? No    PAD/SET Patient? No      Pain Assessment   Currently in Pain? No/denies    Multiple Pain Sites No           Capillary Blood Glucose: No results found for this or any previous visit (from the past 24 hour(s)).    Social History   Tobacco Use  Smoking Status Former Smoker  . Quit date: 01/09/2008  . Years since quitting: 12.3  Smokeless Tobacco Never Used    Goals Met:  Proper associated with RPD/PD & O2 Sat Independence with exercise equipment Exercise tolerated well No report of cardiac concerns or symptoms Strength training completed today  Goals Unmet:  Not Applicable  Comments: Service time is from 1015 to 1130    Dr. Fransico Him is Medical Director for Cardiac Rehab at [facility].

## 2020-05-20 DIAGNOSIS — M25551 Pain in right hip: Secondary | ICD-10-CM | POA: Diagnosis not present

## 2020-05-20 DIAGNOSIS — M545 Low back pain, unspecified: Secondary | ICD-10-CM | POA: Diagnosis not present

## 2020-05-23 DIAGNOSIS — M7061 Trochanteric bursitis, right hip: Secondary | ICD-10-CM | POA: Diagnosis not present

## 2020-05-23 DIAGNOSIS — M7062 Trochanteric bursitis, left hip: Secondary | ICD-10-CM | POA: Diagnosis not present

## 2020-05-23 DIAGNOSIS — M47816 Spondylosis without myelopathy or radiculopathy, lumbar region: Secondary | ICD-10-CM | POA: Diagnosis not present

## 2020-05-23 DIAGNOSIS — G894 Chronic pain syndrome: Secondary | ICD-10-CM | POA: Diagnosis not present

## 2020-05-24 ENCOUNTER — Other Ambulatory Visit: Payer: Self-pay

## 2020-05-24 ENCOUNTER — Encounter (HOSPITAL_COMMUNITY)
Admission: RE | Admit: 2020-05-24 | Discharge: 2020-05-24 | Disposition: A | Discharge status: Home / Self Care | Payer: Medicare Other | Source: Ambulatory Visit | Attending: Pulmonary Disease | Admitting: Pulmonary Disease

## 2020-05-24 VITALS — Wt 176.8 lb

## 2020-05-24 DIAGNOSIS — J439 Emphysema, unspecified: Secondary | ICD-10-CM | POA: Diagnosis not present

## 2020-05-24 NOTE — Progress Notes (Signed)
Daily Session Note  Patient Details  Name: Pamela Roth MRN: [id number] Date of Birth: [birth year] Referring Provider:   April Manson [facility] Rehab Walk Test from 03/28/2020 in [place]  Referring Provider Dr. Erin Fulling      Encounter Date: 05/24/2020  Check In:  Session Check In - 05/24/20 1153      Check-In   Supervising physician immediately available to respond to emergencies [facility] immediately available    Physician(s) Dr. Teryl Lucy    Location [facility]    Staff Present Rosebud Poles, RN, Isaac Laud, MS, ACSM-CEP, Exercise Physiologist;Annedrea Rosezella [place], RN, Ramonita Lab, RN    Virtual Visit No    Medication changes reported     No    Fall or balance concerns reported    No    Tobacco Cessation No Change    Warm-up and Cool-down Performed as group-led instruction    Resistance Training Performed Yes    VAD Patient? No    PAD/SET Patient? No      Pain Assessment   Currently in Pain? No/denies    Multiple Pain Sites No           Capillary Blood Glucose: No results found for this or any previous visit (from the past 24 hour(s)).   Exercise Prescription Changes - 05/24/20 1200      Response to Exercise   Blood Pressure (Admit) 118/70    Blood Pressure (Exercise) 120/78    Blood Pressure (Exit) 118/72    Heart Rate (Admit) 75 bpm    Heart Rate (Exercise) 109 bpm    Heart Rate (Exit) 77 bpm    Oxygen Saturation (Admit) 98 %    Oxygen Saturation (Exercise) 90 %    Oxygen Saturation (Exit) 96 %    Rating of Perceived Exertion (Exercise) 13    Perceived Dyspnea (Exercise) 1    Duration Continue with 30 min of aerobic exercise without signs/symptoms of physical distress.    Intensity THRR unchanged      Progression   Progression Continue to progress workloads to maintain intensity without signs/symptoms of physical distress.      Resistance Training   Training Prescription Yes    Weight  orange bands    Reps 10-15    Time 10 Minutes      Treadmill   MPH 2.3    Grade 1    Minutes 15      NuStep   Level 4    SPM 80    Minutes 15    METs 2.1           Social History   Tobacco Use  Smoking Status Former Smoker  . Quit date: 01/09/2008  . Years since quitting: 12.3  Smokeless Tobacco Never Used    Goals Met:  Proper associated with RPD/PD & O2 Sat Independence with exercise equipment Exercise tolerated well No report of cardiac concerns or symptoms Strength training completed today  Goals Unmet:  Not Applicable  Comments: Service time is from 1015 to 1128    Dr. Fransico Him is Medical Director for Cardiac Rehab at [facility].

## 2020-05-25 DIAGNOSIS — M545 Low back pain, unspecified: Secondary | ICD-10-CM | POA: Diagnosis not present

## 2020-05-25 DIAGNOSIS — M25551 Pain in right hip: Secondary | ICD-10-CM | POA: Diagnosis not present

## 2020-05-26 ENCOUNTER — Other Ambulatory Visit: Payer: Self-pay

## 2020-05-26 ENCOUNTER — Encounter (HOSPITAL_COMMUNITY)
Admission: RE | Admit: 2020-05-26 | Discharge: 2020-05-26 | Disposition: A | Discharge status: Home / Self Care | Payer: Medicare Other | Source: Ambulatory Visit | Attending: Pulmonary Disease | Admitting: Pulmonary Disease

## 2020-05-26 DIAGNOSIS — J439 Emphysema, unspecified: Secondary | ICD-10-CM

## 2020-05-26 NOTE — Progress Notes (Signed)
Daily Session Note  Patient Details  Name: Pamela Roth MRN: [id number] Date of Birth: [birth year] Referring Provider:   April Manson [facility] Rehab Walk Test from 03/28/2020 in [place]  Referring Provider Dr. Erin Fulling      Encounter Date: 05/26/2020  Check In:  Session Check In - 05/26/20 1137      Check-In   Supervising physician immediately available to respond to emergencies [facility] immediately available    Physician(s) Dr. Maren Beach    Location [facility]    Staff Present Rosebud Poles, RN, Isaac Laud, MS, ACSM-CEP, Exercise Physiologist;Taylah Dubiel Ysidro Evert, RN    Virtual Visit No    Medication changes reported     No    Fall or balance concerns reported    No    Tobacco Cessation No Change    Warm-up and Cool-down Performed as group-led instruction    Resistance Training Performed Yes    VAD Patient? No    PAD/SET Patient? No      Pain Assessment   Currently in Pain? No/denies    Multiple Pain Sites No           Capillary Blood Glucose: No results found for this or any previous visit (from the past 24 hour(s)).    Social History   Tobacco Use  Smoking Status Former Smoker  . Quit date: 01/09/2008  . Years since quitting: 12.3  Smokeless Tobacco Never Used    Goals Met:  Exercise tolerated well No report of cardiac concerns or symptoms Strength training completed today  Goals Unmet:  O2 Sat On treadmill dropped sat to 87% O2 increased to 2L, sat improved to 94%. Off TM O2 decreased back to 1L.  Comments: Service time is from 1015 to 1125    Dr. Fransico Him is Medical Director for Cardiac Rehab at [facility].

## 2020-05-27 DIAGNOSIS — M545 Low back pain, unspecified: Secondary | ICD-10-CM | POA: Diagnosis not present

## 2020-05-27 DIAGNOSIS — M25551 Pain in right hip: Secondary | ICD-10-CM | POA: Diagnosis not present

## 2020-05-31 ENCOUNTER — Other Ambulatory Visit: Payer: Self-pay

## 2020-05-31 ENCOUNTER — Encounter (HOSPITAL_COMMUNITY)
Admission: RE | Admit: 2020-05-31 | Discharge: 2020-05-31 | Disposition: A | Discharge status: Home / Self Care | Payer: Medicare Other | Source: Ambulatory Visit | Attending: Pulmonary Disease | Admitting: Pulmonary Disease

## 2020-05-31 DIAGNOSIS — M545 Low back pain, unspecified: Secondary | ICD-10-CM | POA: Diagnosis not present

## 2020-05-31 DIAGNOSIS — J439 Emphysema, unspecified: Secondary | ICD-10-CM

## 2020-05-31 DIAGNOSIS — M25551 Pain in right hip: Secondary | ICD-10-CM | POA: Diagnosis not present

## 2020-05-31 NOTE — Progress Notes (Signed)
Pulmonary Individual Treatment Plan  Patient Details  Name: Pamela Roth MRN: [id number] Date of Birth: 1951/01/07 Referring Provider:   April Manson Pulmonary Rehab Walk Test from 03/28/2020 in [place]  Referring Provider Dr. Erin Fulling      Initial Encounter Date:  Flowsheet Row Pulmonary Rehab Walk Test from 03/28/2020 in [place]  Date 03/28/20      Visit Diagnosis: Pulmonary emphysema, unspecified emphysema type ([place])  Patient's Home Medications on Admission:   Current Outpatient Medications:  .  acetaminophen (TYLENOL) 500 MG tablet, Take 1,000 mg by mouth every 6 (six) hours as needed (for hip pain or mild headaches). (Patient not taking: Reported on 03/28/2020), Disp: , Rfl:  .  albuterol (VENTOLIN HFA) 108 (90 Base) MCG/ACT inhaler, Inhale 2 puffs into the lungs every 6 (six) hours as needed for wheezing or shortness of breath. (Patient not taking: Reported on 03/28/2020), Disp: 6.7 g, Rfl: 0 .  Camphor-Menthol-Methyl Sal (SALONPAS EX), Place 1 patch onto the skin See admin instructions. Apply 1 patch one to two times a day as needed for hip pain (remove old patch first) (Patient not taking: Reported on 03/28/2020), Disp: , Rfl:  .  diclofenac Sodium (VOLTAREN) 1 % GEL, Apply topically., Disp: , Rfl:  .  DULoxetine (CYMBALTA) 30 MG capsule, Take 30 mg by mouth daily., Disp: , Rfl:  .  Fluticasone-Umeclidin-Vilant (TRELEGY ELLIPTA) 100-62.5-25 MCG/INH AEPB, Inhale 1 puff into the lungs daily., Disp: 28 each, Rfl: 6 .  losartan (COZAAR) 50 MG tablet, Take 50 mg by mouth daily., Disp: , Rfl:  .  meloxicam (MOBIC) 7.5 MG tablet, Take 15 mg by mouth daily. (Patient not taking: Reported on 03/28/2020), Disp: , Rfl:  .  Multiple Vitamins-Minerals (ONE-A-DAY PROACTIVE 65+) TABS, Take 1 tablet by mouth daily with breakfast., Disp: , Rfl:  .  naphazoline-glycerin (CLEAR EYES REDNESS) 0.012-0.2 % SOLN, Place 1 drop into both  eyes 4 (four) times daily as needed for eye irritation. (Patient not taking: Reported on 03/28/2020), Disp: , Rfl:  .  simvastatin (ZOCOR) 20 MG tablet, Take 20 mg by mouth daily. , Disp: , Rfl: 5 .  sodium chloride (OCEAN) 0.65 % SOLN nasal spray, Place 1 spray into both nostrils as needed for congestion. (Patient not taking: Reported on 03/28/2020), Disp: 30 mL, Rfl: 0  Past Medical History: Past Medical History:  Diagnosis Date  . Anxiety   . COPD (chronic obstructive pulmonary disease) ([place])   . Depression   . Hyperlipidemia   . Hypertension   . Multinodular goiter     Tobacco Use: Social History   Tobacco Use  Smoking Status Former Smoker  . Quit date: 01/09/2008  . Years since quitting: 12.4  Smokeless Tobacco Never Used    Labs: Recent Review Scientist, physiological    Labs for ITP Cardiac and Pulmonary Rehab Latest Ref Rng & Units 10/27/2006 10/28/2006 10/28/2019   Cholestrol - - 167 ... -   LDLCALC - - 105 .Marland Kitchen.(H) -   HDL - - 57 -   Trlycerides <150 mg/dL - 25 69   PHART - 7.288(L) 7.387 -   PCO2ART - 46.7(H) 44.2 -   HCO3 - 21.6 26.2(H) -   TCO2 - 19.6 23.5 -   ACIDBASEDEF - 4.7(H) - -   O2SAT - 97.4 98.1 -      Capillary Blood Glucose: No results found for: GLUCAP   Pulmonary Assessment Scores:  Pulmonary Assessment Scores  [place] Name 03/28/20 1201 03/28/20 1551       ADL UCSD   ADL Phase Entry --    SOB Score total 31 --         CAT Score   CAT Score 13 --         mMRC Score   mMRC Score -- 1          UCSD: Self-administered rating of dyspnea associated with activities of daily living (ADLs) 6-point scale (0 = "not at all" to 5 = "maximal or unable to do because of breathlessness")  Scoring Scores range from 0 to 120.  Minimally important difference is 5 units  CAT: CAT can identify the health impairment of COPD patients and is better correlated with disease progression.  CAT has a scoring range of zero to 40. The CAT score is classified into four  groups of low (less than 10), medium (10 - 20), high (21-30) and very high (31-40) based on the impact level of disease on health status. A CAT score over 10 suggests significant symptoms.  A worsening CAT score could be explained by an exacerbation, poor medication adherence, poor inhaler technique, or progression of COPD or comorbid conditions.  CAT MCID is 2 points  mMRC: mMRC (Modified Medical Research Council) Dyspnea Scale is used to assess the degree of baseline functional disability in patients of respiratory disease due to dyspnea. No minimal important difference is established. A decrease in score of 1 point or greater is considered a positive change.   Pulmonary Function Assessment:  Pulmonary Function Assessment - 03/28/20 1214      Breath   Bilateral Breath Sounds Clear    Shortness of Breath Yes;Limiting activity           Exercise Target Goals: Exercise Program Goal: Individual exercise prescription set using results from initial 6 min walk test and THRR while considering  patient's activity barriers and safety.   Exercise Prescription Goal: Initial exercise prescription builds to 30-45 minutes a day of aerobic activity, 2-3 days per week.  Home exercise guidelines will be given to patient during program as part of exercise prescription that the participant will acknowledge.  Activity Barriers & Risk Stratification:  Activity Barriers & Cardiac Risk Stratification - 03/28/20 1139      Activity Barriers & Cardiac Risk Stratification   Activity Barriers Arthritis;Back Problems;Right Hip Replacement;Joint Problems;Deconditioning;Muscular Weakness;Shortness of Breath           6 Minute Walk:  6 Minute Walk    Row Name 03/28/20 1439         6 Minute Walk   Phase Initial     Distance 1356 feet     Walk Time 6 minutes     # of Rest Breaks 0     MPH 2.57     METS 3.35     RPE 9     Perceived Dyspnea  0     VO2 Peak 11.74     Symptoms No     Resting HR 75 bpm      Resting BP 148/88     Resting Oxygen Saturation  93 %     Exercise Oxygen Saturation  during 6 min walk 86 %     Max Ex. HR 99 bpm     Max Ex. BP 152/86     2 Minute Post BP 140/84           Interval HR   1 Minute HR 90  2 Minute HR 99     3 Minute HR 98     4 Minute HR 95     5 Minute HR 91     6 Minute HR 97     2 Minute Post HR 67     Interval Heart Rate? Yes           Interval Oxygen   Interval Oxygen? Yes     Baseline Oxygen Saturation % 93 %     1 Minute Oxygen Saturation % 92 %     1 Minute Liters of Oxygen 0 L     2 Minute Oxygen Saturation % 89 %     2 Minute Liters of Oxygen 0 L     3 Minute Oxygen Saturation % 87 %     3 Minute Liters of Oxygen 1 L     4 Minute Oxygen Saturation % 91 %     4 Minute Liters of Oxygen 1 L     5 Minute Oxygen Saturation % 89 %     5 Minute Liters of Oxygen 1 L     6 Minute Oxygen Saturation % 86 %     6 Minute Liters of Oxygen 1 L     2 Minute Post Oxygen Saturation % 98 %     2 Minute Post Liters of Oxygen 1 L            Oxygen Initial Assessment:  Oxygen Initial Assessment - 03/28/20 1213      Home Oxygen   Home Oxygen Device Portable Concentrator;E-Tanks    Sleep Oxygen Prescription None    Home Exercise Oxygen Prescription Continuous    Liters per minute 1    Home Resting Oxygen Prescription None    Compliance with Home Oxygen Use Yes      Initial 6 min Walk   Oxygen Used Continuous    Liters per minute 1      Program Oxygen Prescription   Program Oxygen Prescription Continuous    Liters per minute -1    Comments Pt may need 2L of oxygen for walking longer distances. Started her on RA for 6 minute walk test, she desaturated to 86/87% right before minute 3. Put her on 1L and she started to drop again below 87% around minute 5 and 6. Will monitor oxygen needs with specific exercises.      Intervention   Short Term Goals To learn and exhibit compliance with exercise, home and travel O2 prescription;To  learn and understand importance of monitoring SPO2 with pulse oximeter and demonstrate accurate use of the pulse oximeter.;To learn and understand importance of maintaining oxygen saturations>88%;To learn and demonstrate proper pursed lip breathing techniques or other breathing techniques.;To learn and demonstrate proper use of respiratory medications    Long  Term Goals Maintenance of O2 saturations>88%;Exhibits compliance with exercise, home and travel O2 prescription;Verbalizes importance of monitoring SPO2 with pulse oximeter and return demonstration;Exhibits proper breathing techniques, such as pursed lip breathing or other method taught during program session;Compliance with respiratory medication;Demonstrates proper use of MDI's           Oxygen Re-Evaluation:  Oxygen Re-Evaluation    Row Name 04/05/20 8110 04/29/20 1555 05/26/20 0812         Program Oxygen Prescription   Program Oxygen Prescription Continuous Continuous Continuous     Liters per minute _0 Comments Pt may need 2L of oxygen for walking longer distances. Started her  on RA for 6 minute walk test, she desaturated to 86/87% right before minute 3. Put her on 1L and she started to drop again below 87% around minute 5 and 6. Will monitor oxygen needs with specific exercises. Pt has only required 1L ox oxygen with seated exercise and with walking. --           Home Oxygen   Home Oxygen Device Portable Concentrator;E-Tanks Portable Concentrator;E-Tanks Portable Concentrator;E-Tanks     Sleep Oxygen Prescription None None None     Home Exercise Oxygen Prescription Continuous Continuous Continuous     Liters per minute _0 Home Resting Oxygen Prescription None None None     Compliance with Home Oxygen Use Yes Yes Yes           Goals/Expected Outcomes   Short Term Goals To learn and exhibit compliance with exercise, home and travel O2 prescription;To learn and understand importance of monitoring SPO2 with pulse  oximeter and demonstrate accurate use of the pulse oximeter.;To learn and understand importance of maintaining oxygen saturations>88%;To learn and demonstrate proper pursed lip breathing techniques or other breathing techniques.;To learn and demonstrate proper use of respiratory medications To learn and exhibit compliance with exercise, home and travel O2 prescription;To learn and understand importance of monitoring SPO2 with pulse oximeter and demonstrate accurate use of the pulse oximeter.;To learn and understand importance of maintaining oxygen saturations>88%;To learn and demonstrate proper pursed lip breathing techniques or other breathing techniques.;To learn and demonstrate proper use of respiratory medications To learn and exhibit compliance with exercise, home and travel O2 prescription;To learn and understand importance of monitoring SPO2 with pulse oximeter and demonstrate accurate use of the pulse oximeter.;To learn and understand importance of maintaining oxygen saturations>88%;To learn and demonstrate proper pursed lip breathing techniques or other breathing techniques.;To learn and demonstrate proper use of respiratory medications     Long  Term Goals Maintenance of O2 saturations>88%;Exhibits compliance with exercise, home and travel O2 prescription;Verbalizes importance of monitoring SPO2 with pulse oximeter and return demonstration;Exhibits proper breathing techniques, such as pursed lip breathing or other method taught during program session;Compliance with respiratory medication;Demonstrates proper use of MDI's Maintenance of O2 saturations>88%;Exhibits compliance with exercise, home and travel O2 prescription;Verbalizes importance of monitoring SPO2 with pulse oximeter and return demonstration;Exhibits proper breathing techniques, such as pursed lip breathing or other method taught during program session;Compliance with respiratory medication;Demonstrates proper use of MDI's Maintenance of  O2 saturations>88%;Exhibits compliance with exercise, home and travel O2 prescription;Verbalizes importance of monitoring SPO2 with pulse oximeter and return demonstration;Exhibits proper breathing techniques, such as pursed lip breathing or other method taught during program session;Compliance with respiratory medication;Demonstrates proper use of MDI's     Comments -- Pt's goal is to not have to rely on supplemental oxygen with exertion. She does not require it at rest, but currently needs it with exertion. At some point if we think this is ideal, based on oxygen saturation levels we are receiving, we will try room air with exertion. --     Goals/Expected Outcomes compliance and understanding of oxygen saturation and pursed lip breathing. compliance and understanding of oxygen saturation and pursed lip breathing. Pt to continue monitoring oxygen saturation at home once she completes the program and for her to also continue pursed lip breathing as needed with activities.            Oxygen Discharge (Final Oxygen Re-Evaluation):  Oxygen Re-Evaluation - 05/26/20 0812      Program  Oxygen Prescription   Program Oxygen Prescription Continuous    Liters per minute 1      Home Oxygen   Home Oxygen Device Portable Concentrator;E-Tanks    Sleep Oxygen Prescription None    Home Exercise Oxygen Prescription Continuous    Liters per minute 1    Home Resting Oxygen Prescription None    Compliance with Home Oxygen Use Yes      Goals/Expected Outcomes   Short Term Goals To learn and exhibit compliance with exercise, home and travel O2 prescription;To learn and understand importance of monitoring SPO2 with pulse oximeter and demonstrate accurate use of the pulse oximeter.;To learn and understand importance of maintaining oxygen saturations>88%;To learn and demonstrate proper pursed lip breathing techniques or other breathing techniques.;To learn and demonstrate proper use of respiratory medications     Long  Term Goals Maintenance of O2 saturations>88%;Exhibits compliance with exercise, home and travel O2 prescription;Verbalizes importance of monitoring SPO2 with pulse oximeter and return demonstration;Exhibits proper breathing techniques, such as pursed lip breathing or other method taught during program session;Compliance with respiratory medication;Demonstrates proper use of MDI's    Goals/Expected Outcomes Pt to continue monitoring oxygen saturation at home once she completes the program and for her to also continue pursed lip breathing as needed with activities.           Initial Exercise Prescription:  Initial Exercise Prescription - 03/28/20 1400      Date of Initial Exercise RX and Referring Provider   Date 03/28/20    Referring Provider Dr. Erin Fulling    Expected Discharge Date 06/02/20      Treadmill   MPH 2.2    Grade 0    Minutes 15      NuStep   Level 1    SPM 80    Minutes 15      Prescription Details   Frequency (times per week) 2    Duration Progress to 30 minutes of continuous aerobic without signs/symptoms of physical distress      Intensity   THRR 40-80% of Max Heartrate 60-119    Ratings of Perceived Exertion 11-13    Perceived Dyspnea 0-4      Progression   Progression Continue to progress workloads to maintain intensity without signs/symptoms of physical distress.      Resistance Training   Training Prescription Yes    Weight orange bands    Reps 10-15           Perform Capillary Blood Glucose checks as needed.  Exercise Prescription Changes:  Exercise Prescription Changes    Row Name 04/12/20 1200 04/26/20 1500 05/10/20 1500 05/24/20 1200       Response to Exercise   Blood Pressure (Admit) 136/80 124/80 114/74 118/70    Blood Pressure (Exercise) 128/80 122/66 130/70 120/78    Blood Pressure (Exit) 140/80 130/80 118/78 118/72    Heart Rate (Admit) 81 bpm 72 bpm 91 bpm 75 bpm    Heart Rate (Exercise) 92 bpm 105 bpm 107 bpm 109 bpm     Heart Rate (Exit) 77 bpm 85 bpm 87 bpm 77 bpm    Oxygen Saturation (Admit) 99 % 98 % 95 % 98 %    Oxygen Saturation (Exercise) 94 % 90 % 92 % 90 %    Oxygen Saturation (Exit) 99 % 96 % 97 % 96 %    Rating of Perceived Exertion (Exercise) _0 Perceived Dyspnea (Exercise) 0 1 1 1  Duration Progress to 30 minutes of  aerobic without signs/symptoms of physical distress Continue with 30 min of aerobic exercise without signs/symptoms of physical distress. Continue with 30 min of aerobic exercise without signs/symptoms of physical distress. Continue with 30 min of aerobic exercise without signs/symptoms of physical distress.    Intensity Other (comment)  40-80% of HRR THRR unchanged THRR unchanged THRR unchanged         Progression   Progression Continue to progress workloads to maintain intensity without signs/symptoms of physical distress. Continue to progress workloads to maintain intensity without signs/symptoms of physical distress. Continue to progress workloads to maintain intensity without signs/symptoms of physical distress. Continue to progress workloads to maintain intensity without signs/symptoms of physical distress.         Resistance Training   Training Prescription Yes Yes Yes Yes    Weight orange bands orange bands orange bands orange bands    Reps 10-15 10-15 10-15 10-15    Time 10 Minutes 10 Minutes 10 Minutes 10 Minutes         Treadmill   MPH -- 2.4 2.6 2.3    Grade -- 0 1 1    Minutes -- _0 NuStep   Level _1 SPM 80 80 80 80    Minutes _2 METs 2.1 1.7 2.4 2.1         Home Exercise Plan   Plans to continue exercise at -- Home (comment)  Elliptical, Walking, and Stretching -- --    Frequency -- Add 3 additional days to program exercise sessions. -- --    Initial Home Exercises Provided -- 04/26/20 -- --           Exercise Comments:  Exercise Comments    Row Name 04/07/20 1644 04/26/20 1548         Exercise  Comments Pt completed first day of exercise and tolerated well with no complaints or concerns. She was able to do 15 minutes on the Nustep and 15 minutes on the treadmill. She also completed 10 minutes of resistance training with no mobility issues. Will continue to monitor. Completed home exercise with pt. Pt states she does exercise at home on a regular basis. She walks and does stretches. We discussed how to progress home exercise prescription and she was receptive.             Exercise Goals and Review:  Exercise Goals    Row Name 03/28/20 1435             Exercise Goals   Increase Physical Activity Yes       Intervention Provide advice, education, support and counseling about physical activity/exercise needs.;Develop an individualized exercise prescription for aerobic and resistive training based on initial evaluation findings, risk stratification, comorbidities and participant's personal goals.       Expected Outcomes Short Term: Attend rehab on a regular basis to increase amount of physical activity.;Long Term: Add in home exercise to make exercise part of routine and to increase amount of physical activity.;Long Term: Exercising regularly at least 3-5 days a week.       Increase Strength and Stamina Yes       Intervention Provide advice, education, support and counseling about physical activity/exercise needs.;Develop an individualized exercise prescription for aerobic and resistive training based on initial evaluation findings, risk stratification, comorbidities and participant's personal goals.  Expected Outcomes Short Term: Increase workloads from initial exercise prescription for resistance, speed, and METs.;Short Term: Perform resistance training exercises routinely during rehab and add in resistance training at home;Long Term: Improve cardiorespiratory fitness, muscular endurance and strength as measured by increased METs and functional capacity (6MWT)       Able to understand  and use rate of perceived exertion (RPE) scale Yes       Intervention Provide education and explanation on how to use RPE scale       Expected Outcomes Short Term: Able to use RPE daily in rehab to express subjective intensity level;Long Term:  Able to use RPE to guide intensity level when exercising independently       Able to understand and use Dyspnea scale Yes       Intervention Provide education and explanation on how to use Dyspnea scale       Expected Outcomes Short Term: Able to use Dyspnea scale daily in rehab to express subjective sense of shortness of breath during exertion;Long Term: Able to use Dyspnea scale to guide intensity level when exercising independently       Knowledge and understanding of Target Heart Rate Range (THRR) Yes       Intervention Provide education and explanation of THRR including how the numbers were predicted and where they are located for reference       Expected Outcomes Short Term: Able to state/look up THRR;Long Term: Able to use THRR to govern intensity when exercising independently;Short Term: Able to use daily as guideline for intensity in rehab       Understanding of Exercise Prescription Yes       Intervention Provide education, explanation, and written materials on patient's individual exercise prescription       Expected Outcomes Short Term: Able to explain program exercise prescription;Long Term: Able to explain home exercise prescription to exercise independently              Exercise Goals Re-Evaluation :  Exercise Goals Re-Evaluation    Row Name 04/05/20 0830 04/29/20 1549 05/26/20 0807         Exercise Goal Re-Evaluation   Exercise Goals Review Increase Physical Activity;Increase Strength and Stamina;Able to understand and use rate of perceived exertion (RPE) scale;Able to understand and use Dyspnea scale;Knowledge and understanding of Target Heart Rate Range (THRR);Understanding of Exercise Prescription Increase Physical Activity;Increase  Strength and Stamina;Able to understand and use rate of perceived exertion (RPE) scale;Able to understand and use Dyspnea scale;Knowledge and understanding of Target Heart Rate Range (THRR);Understanding of Exercise Prescription Increase Physical Activity;Increase Strength and Stamina;Able to understand and use rate of perceived exertion (RPE) scale;Able to understand and use Dyspnea scale;Knowledge and understanding of Target Heart Rate Range (THRR);Understanding of Exercise Prescription     Comments Pt starts exercise this week and is too soon to note any progression. Will continue to monitor and follow. [place] has completed 8 exercises sessions and has been consistent with workload and MET level increases. She is very motived to get better and to not have to rely on supplemental oxygen. She is exercising at 2.4 METS on the Nustep and 3.0 METS on the treadmill. Will continue to monitor and progress as she is able. [place] has completed 14 exercise sessions and has been consistent with progression throughout the program. She is scheduled to complete the program next week. She is currently exercising at 2.5 METS on the Nustep and 3.1 METS on the treadmill. Will continue to monitor and progress as  she is able.     Expected Outcomes Through exercise at rehab and home, the patient will decrease shortness of breath with daily activities and feel confident in carrying out an exercise regimn at home. Through exercise at rehab and home, the patient will decrease shortness of breath with daily activities and feel confident in carrying out an exercise regimn at home. For pt to continue exercise at home or a local fitness facility once completing our program and continue to decrease shortness of breath with daily activities.            Discharge Exercise Prescription (Final Exercise Prescription Changes):  Exercise Prescription Changes - 05/24/20 1200      Response to Exercise   Blood Pressure (Admit) 118/70     Blood Pressure (Exercise) 120/78    Blood Pressure (Exit) 118/72    Heart Rate (Admit) 75 bpm    Heart Rate (Exercise) 109 bpm    Heart Rate (Exit) 77 bpm    Oxygen Saturation (Admit) 98 %    Oxygen Saturation (Exercise) 90 %    Oxygen Saturation (Exit) 96 %    Rating of Perceived Exertion (Exercise) 13    Perceived Dyspnea (Exercise) 1    Duration Continue with 30 min of aerobic exercise without signs/symptoms of physical distress.    Intensity THRR unchanged      Progression   Progression Continue to progress workloads to maintain intensity without signs/symptoms of physical distress.      Resistance Training   Training Prescription Yes    Weight orange bands    Reps 10-15    Time 10 Minutes      Treadmill   MPH 2.3    Grade 1    Minutes 15      NuStep   Level 4    SPM 80    Minutes 15    METs 2.1           Nutrition:  Target Goals: Understanding of nutrition guidelines, daily intake of sodium <1546m, cholesterol <2046m calories 30% from fat and 7% or less from saturated fats, daily to have 5 or more servings of fruits and vegetables.  Biometrics:  Pre Biometrics - 03/28/20 1140      Pre Biometrics   Grip Strength 30.5 kg            Nutrition Therapy Plan and Nutrition Goals:  Nutrition Therapy & Goals - 04/12/20 1159      Nutrition Therapy   Diet TLC    Drug/Food Interactions Statins/Certain Fruits      Personal Nutrition Goals   Nutrition Goal Pt to identify food quantities necessary to achieve weight loss of 5-10 lb at graduation from pulmonary rehab.    Personal Goal #2 Pt to limit added sugas <25 g/day    Personal Goal #3 Pt to build a healthy plate including vegetables, fruits, whole grains, and low-fat dairy products in a heart healthy meal plan.      Intervention Plan   Intervention Prescribe, educate and counsel regarding individualized specific dietary modifications aiming towards targeted core components such as weight, hypertension,  lipid management, diabetes, heart failure and other comorbidities.;Nutrition handout(s) given to patient.    Expected Outcomes Short Term Goal: Understand basic principles of dietary content, such as calories, fat, sodium, cholesterol and nutrients.;Long Term Goal: Adherence to prescribed nutrition plan.           Nutrition Assessments:  MEDIFICTS Score Key:  ?70 Need to make dietary changes  40-70 Heart Healthy Diet  ? 40 Therapeutic Level Cholesterol Diet  Flowsheet Row PULMONARY REHAB OTHER RESPIRATORY from 04/12/2020 in [place]  Picture Your Plate Total Score on Admission 53     Picture Your Plate Scores:  <63 Unhealthy dietary pattern with much room for improvement.  41-50 Dietary pattern unlikely to meet recommendations for good health and room for improvement.  51-60 More healthful dietary pattern, with some room for improvement.   >60 Healthy dietary pattern, although there may be some specific behaviors that could be improved.    Nutrition Goals Re-Evaluation:  Nutrition Goals Re-Evaluation    [place] Name 04/12/20 1200 04/28/20 1447 05/30/20 1203         Goals   Current Weight 175 lb (79.4 kg) 175 lb 14.8 oz (79.8 kg) 176 lb 12.9 oz (80.2 kg)     Nutrition Goal Pt to identify food quantities necessary to achieve weight loss of 5-10 lb at graduation from pulmonary rehab. Pt to identify food quantities necessary to achieve weight loss of 5-10 lb at graduation from pulmonary rehab. Pt to identify food quantities necessary to achieve weight loss of 5-10 lb at graduation from pulmonary rehab.     Comment -- Reviewed label reading with pt Weight maintained.           Personal Goal #2 Re-Evaluation   Personal Goal #2 Pt to limit added sugas <25 g/day Pt to limit added sugars <25 g/day Pt to limit added sugars <25 g/day           Personal Goal #3 Re-Evaluation   Personal Goal #3 Pt to build a healthy plate including vegetables, fruits,  whole grains, and low-fat dairy products in a heart healthy meal plan. Pt to build a healthy plate including vegetables, fruits, whole grains, and low-fat dairy products in a heart healthy meal plan. Pt to build a healthy plate including vegetables, fruits, whole grains, and low-fat dairy products in a heart healthy meal plan.            Nutrition Goals Discharge (Final Nutrition Goals Re-Evaluation):  Nutrition Goals Re-Evaluation - 05/30/20 1203      Goals   Current Weight 176 lb 12.9 oz (80.2 kg)    Nutrition Goal Pt to identify food quantities necessary to achieve weight loss of 5-10 lb at graduation from pulmonary rehab.    Comment Weight maintained.      Personal Goal #2 Re-Evaluation   Personal Goal #2 Pt to limit added sugars <25 g/day      Personal Goal #3 Re-Evaluation   Personal Goal #3 Pt to build a healthy plate including vegetables, fruits, whole grains, and low-fat dairy products in a heart healthy meal plan.           Psychosocial: Target Goals: Acknowledge presence or absence of significant depression and/or stress, maximize coping skills, provide positive support system. Participant is able to verbalize types and ability to use techniques and skills needed for reducing stress and depression.  Initial Review & Psychosocial Screening:  Initial Psych Review & Screening - 03/28/20 1202      Initial Review   Current issues with None Identified      Family Dynamics   Good Support System? Yes      Barriers   Psychosocial barriers to participate in program There are no identifiable barriers or psychosocial needs.      Screening Interventions   Interventions Encouraged to exercise  Quality of Life Scores:  Scores of 19 and below usually indicate a poorer quality of life in these areas.  A difference of  2-3 points is a clinically meaningful difference.  A difference of 2-3 points in the total score of the Quality of Life Index has been associated with  significant improvement in overall quality of life, self-image, physical symptoms, and general health in studies assessing change in quality of life.  PHQ-9: Recent Review Flowsheet Data    Depression screen [facility] 2/9 03/28/2020   Decreased Interest 0   Down, Depressed, Hopeless 0   PHQ - 2 Score 0   Altered sleeping 0   Tired, decreased energy 1   Change in appetite 0   Feeling bad or failure about yourself  0   Trouble concentrating 0   Moving slowly or fidgety/restless 0   Suicidal thoughts 0   PHQ-9 Score 1   Difficult doing work/chores Not difficult at all     Interpretation of Total Score  Total Score Depression Severity:  1-4 = Minimal depression, 5-9 = Mild depression, 10-14 = Moderate depression, 15-19 = Moderately severe depression, 20-27 = Severe depression   Psychosocial Evaluation and Intervention:  Psychosocial Evaluation - 03/28/20 1211      Psychosocial Evaluation & Interventions   Interventions Encouraged to exercise with the program and follow exercise prescription    Comments No concerns identified    Expected Outcomes To continue to have no psychosocial concerns while participating in pulmonary rehab.    Continue Psychosocial Services  No Follow up required           Psychosocial Re-Evaluation:  Psychosocial Re-Evaluation    [place] Name 04/04/20 1221 04/05/20 0903 04/26/20 0908 05/23/20 1228       Psychosocial Re-Evaluation   Current issues with None Identified None Identified None Identified None Identified    Comments No change since orientation/walk test 1 week ago. No psychosocial concerns identified at this time. No psychosocial concerns identified at this time. No concerns identified at this time.    Expected Outcomes To continue to be free of psychosocial concerns while participating in pulmonary rehab. For Serenitee to continue to be free of psychosocial concerns while participating in pulmonary rehab. For Shayanna to continue to be free of psychosocial  concerns while participating in pulmonary rehab. For Velva to continue to be free of psychosocial concerns while participating in pulmonary rehab.    Interventions Encouraged to attend Pulmonary Rehabilitation for the exercise Encouraged to attend Pulmonary Rehabilitation for the exercise Encouraged to attend Pulmonary Rehabilitation for the exercise Encouraged to attend Pulmonary Rehabilitation for the exercise    Continue Psychosocial Services  No Follow up required No Follow up required No Follow up required No Follow up required           Psychosocial Discharge (Final Psychosocial Re-Evaluation):  Psychosocial Re-Evaluation - 05/23/20 1228      Psychosocial Re-Evaluation   Current issues with None Identified    Comments No concerns identified at this time.    Expected Outcomes For Zoella to continue to be free of psychosocial concerns while participating in pulmonary rehab.    Interventions Encouraged to attend Pulmonary Rehabilitation for the exercise    Continue Psychosocial Services  No Follow up required           Education: Education Goals: Education classes will be provided on a weekly basis, covering required topics. Participant will state understanding/return demonstration of topics presented.  Learning Barriers/Preferences:  Learning Barriers/Preferences -  03/28/20 1213      Learning Barriers/Preferences   Learning Barriers None    Learning Preferences Computer/Internet;Written Material           Education Topics: Risk Factor Reduction:  -Group instruction that is supported by a PowerPoint presentation. Instructor discusses the definition of a risk factor, different risk factors for pulmonary disease, and how the heart and lungs work together.     Nutrition for Pulmonary Patient:  -Group instruction provided by PowerPoint slides, verbal discussion, and written materials to support subject matter. The instructor gives an explanation and review of healthy diet  recommendations, which includes a discussion on weight management, recommendations for fruit and vegetable consumption, as well as protein, fluid, caffeine, fiber, sodium, sugar, and alcohol. Tips for eating when patients are short of breath are discussed.   Pursed Lip Breathing:  -Group instruction that is supported by demonstration and informational handouts. Instructor discusses the benefits of pursed lip and diaphragmatic breathing and detailed demonstration on how to preform both.   Flowsheet Row PULMONARY REHAB OTHER RESPIRATORY from 05/26/2020 in [place]  Date 04/14/20  Educator Handout-Jess  Instruction Review Code 1- Verbalizes Understanding      Oxygen Safety:  -Group instruction provided by PowerPoint, verbal discussion, and written material to support subject matter. There is an overview of "What is Oxygen" and "Why do we need it".  Instructor also reviews how to create a safe environment for oxygen use, the importance of using oxygen as prescribed, and the risks of noncompliance. There is a brief discussion on traveling with oxygen and resources the patient may utilize.   Oxygen Equipment:  -Group instruction provided by [facility] Staff utilizing handouts, written materials, and equipment demonstrations.   Signs and Symptoms:  -Group instruction provided by written material and verbal discussion to support subject matter. Warning signs and symptoms of infection, stroke, and heart attack are reviewed and when to call the physician/911 reinforced. Tips for preventing the spread of infection discussed.   Advanced Directives:  -Group instruction provided by verbal instruction and written material to support subject matter. Instructor reviews Advanced Directive laws and proper instruction for filling out document.   Pulmonary Video:  -Group video education that reviews the importance of medication and oxygen compliance, exercise, good  nutrition, pulmonary hygiene, and pursed lip and diaphragmatic breathing for the pulmonary patient.   Exercise for the Pulmonary Patient:  -Group instruction that is supported by a PowerPoint presentation. Instructor discusses benefits of exercise, core components of exercise, frequency, duration, and intensity of an exercise routine, importance of utilizing pulse oximetry during exercise, safety while exercising, and options of places to exercise outside of rehab.     Pulmonary Medications:  -Verbally interactive group education provided by instructor with focus on inhaled medications and proper administration.   Anatomy and Physiology of the Respiratory System and Intimacy:  -Group instruction provided by PowerPoint, verbal discussion, and written material to support subject matter. Instructor reviews respiratory cycle and anatomical components of the respiratory system and their functions. Instructor also reviews differences in obstructive and restrictive respiratory diseases with examples of each. Intimacy, Sex, and Sexuality differences are reviewed with a discussion on how relationships can change when diagnosed with pulmonary disease. Common sexual concerns are reviewed.   MD DAY -A group question and answer session with a medical doctor that allows participants to ask questions that relate to their pulmonary disease state.   OTHER EDUCATION -Group or individual verbal, written, or video instructions  that support the educational goals of the pulmonary rehab program. [place] OTHER RESPIRATORY from 05/26/2020 in [place]  Date 05/26/20  Willodean Rosenthal reading]  Educator handout  [Rate my plate]      Holiday Eating Survival Tips:  -Group instruction provided by PowerPoint slides, verbal discussion, and written materials to support subject matter. The instructor gives patients tips, tricks, and techniques to help them not only survive but  enjoy the holidays despite the onslaught of food that accompanies the holidays.   Knowledge Questionnaire Score:  Knowledge Questionnaire Score - 03/28/20 1205      Knowledge Questionnaire Score   Pre Score 16/18           Core Components/Risk Factors/Patient Goals at Admission:  Personal Goals and Risk Factors at Admission - 03/28/20 1205      Core Components/Risk Factors/Patient Goals on Admission   Improve shortness of breath with ADL's Yes    Intervention Provide education, individualized exercise plan and daily activity instruction to help decrease symptoms of SOB with activities of daily living.    Expected Outcomes Short Term: Improve cardiorespiratory fitness to achieve a reduction of symptoms when performing ADLs;Long Term: Be able to perform more ADLs without symptoms or delay the onset of symptoms           Core Components/Risk Factors/Patient Goals Review:   Goals and Risk Factor Review    Row Name 03/28/20 1205 04/04/20 1222 04/05/20 0904 04/26/20 0909 05/23/20 1230     Core Components/Risk Factors/Patient Goals Review   Personal Goals Review Develop more efficient breathing techniques such as purse lipped breathing and diaphragmatic breathing and practicing self-pacing with activity.;Increase knowledge of respiratory medications and ability to use respiratory devices properly.;Improve shortness of breath with ADL's Develop more efficient breathing techniques such as purse lipped breathing and diaphragmatic breathing and practicing self-pacing with activity.;Increase knowledge of respiratory medications and ability to use respiratory devices properly.;Improve shortness of breath with ADL's Develop more efficient breathing techniques such as purse lipped breathing and diaphragmatic breathing and practicing self-pacing with activity.;Increase knowledge of respiratory medications and ability to use respiratory devices properly.;Improve shortness of breath with ADL's Develop  more efficient breathing techniques such as purse lipped breathing and diaphragmatic breathing and practicing self-pacing with activity.;Increase knowledge of respiratory medications and ability to use respiratory devices properly.;Improve shortness of breath with ADL's Develop more efficient breathing techniques such as purse lipped breathing and diaphragmatic breathing and practicing self-pacing with activity.;Increase knowledge of respiratory medications and ability to use respiratory devices properly.;Improve shortness of breath with ADL's   Review -- Breeann will begin exercising 04/05/2020, unable to accomplish goals until she begins program. Alice begins exercising in pulmonary rehab 04/05/2020. Pallavi has attended 6 exercise sessions and is improving her deconditioning and learning how to exercise safely with her emphysema and post-covid pneumonia. Teigan requires 1 l/min of supplemental oxygen when exercising.  She has improved her strenth and stamina and is exercising @ 2.5 mets on the nustep.   Expected Outcomes -- See admission goals. See admission goals. See admission goals. See admission goals.          Core Components/Risk Factors/Patient Goals at Discharge (Final Review):   Goals and Risk Factor Review - 05/23/20 1230      Core Components/Risk Factors/Patient Goals Review   Personal Goals Review Develop more efficient breathing techniques such as purse lipped breathing and diaphragmatic breathing and practicing self-pacing with activity.;Increase knowledge of respiratory medications and ability to  use respiratory devices properly.;Improve shortness of breath with ADL's    Review Sacha requires 1 l/min of supplemental oxygen when exercising.  She has improved her strenth and stamina and is exercising @ 2.5 mets on the nustep.    Expected Outcomes See admission goals.           ITP Comments:   Comments:  Shaquel has completed 15 exercise session in Pulmonary rehab. Pt  maintains good attendance and consistent home exercise. Pulmonary rehab staff will continue to monitor and reassess progress toward goals during her participation in Pulmonary Rehab.

## 2020-06-02 ENCOUNTER — Encounter (HOSPITAL_COMMUNITY)
Admission: RE | Admit: 2020-06-02 | Discharge: 2020-06-02 | Disposition: A | Discharge status: Home / Self Care | Payer: Medicare Other | Source: Ambulatory Visit | Attending: Pulmonary Disease | Admitting: Pulmonary Disease

## 2020-06-02 ENCOUNTER — Other Ambulatory Visit: Payer: Self-pay

## 2020-06-02 DIAGNOSIS — J439 Emphysema, unspecified: Secondary | ICD-10-CM | POA: Diagnosis not present

## 2020-06-02 NOTE — Progress Notes (Signed)
Daily Session Note  Patient Details  Name: Pamela Roth MRN: [id number] Date of Birth: 11-18-1950 Referring Provider:   April Manson Pulmonary Rehab Walk Test from 03/28/2020 in [place]  Referring Provider Dr. Erin Fulling      Encounter Date: 06/02/2020  Check In:  Session Check In - 06/02/20 1200      Check-In   Supervising physician immediately available to respond to emergencies [facility] immediately available    Physician(s) Dr. Maren Beach    Location [facility]    Staff Present Leda Roys, MS, ACSM-CEP, Exercise Physiologist;Other;Lisa Lizbeth Bark, MS, ACSM-CEP, CCRP, Exercise Physiologist    Virtual Visit No    Medication changes reported     No    Fall or balance concerns reported    No    Tobacco Cessation No Change    Warm-up and Cool-down Performed as group-led instruction    Resistance Training Performed Yes    VAD Patient? No    PAD/SET Patient? No      Pain Assessment   Currently in Pain? No/denies    Multiple Pain Sites No           Capillary Blood Glucose: No results found for this or any previous visit (from the past 24 hour(s)).    Social History   Tobacco Use  Smoking Status Former Smoker  . Quit date: 01/09/2008  . Years since quitting: 12.4  Smokeless Tobacco Never Used    Goals Met:  Proper associated with RPD/PD & O2 Sat Improved SOB with ADL's Exercise tolerated well No report of cardiac concerns or symptoms Completed pulmonary rehab program  Goals Unmet:  Not Applicable  Comments: Service time is from 1015 to 1130 Patient completed pulmonary rehab undergrad program    Dr. Fransico Him is Medical Director for Cardiac Rehab at [facility].

## 2020-06-10 DIAGNOSIS — M545 Low back pain, unspecified: Secondary | ICD-10-CM | POA: Diagnosis not present

## 2020-06-10 DIAGNOSIS — M25551 Pain in right hip: Secondary | ICD-10-CM | POA: Diagnosis not present

## 2020-06-12 DIAGNOSIS — Z23 Encounter for immunization: Secondary | ICD-10-CM | POA: Diagnosis not present

## 2020-06-21 DIAGNOSIS — M545 Low back pain, unspecified: Secondary | ICD-10-CM | POA: Diagnosis not present

## 2020-06-21 DIAGNOSIS — M25551 Pain in right hip: Secondary | ICD-10-CM | POA: Diagnosis not present

## 2020-06-28 NOTE — Addendum Note (Signed)
Encounter addended by: Lance Morin, RN on: 06/28/2020 3:31 PM  Actions taken: Clinical Note Signed, Episode resolved

## 2020-06-28 NOTE — Progress Notes (Signed)
Discharge Progress Report  Patient Details  Name: Pamela Roth MRN: [id number] Date of Birth: Apr 01, 1950 Referring Provider:   [facility] Walk Test from 03/28/2020 in [facility]  Referring Provider Dr. Erin Fulling        Number of Visits: 17  Reason for Discharge:  Patient reached a stable level of exercise. Patient independent in their exercise. Patient has met program and personal goals.  Smoking History:  Social History   Tobacco Use  Smoking Status Former   Pack years: 0.00   Types: Cigarettes   Quit date: 01/09/2008   Years since quitting: 12.4  Smokeless Tobacco Never    Diagnosis:  Pulmonary emphysema, unspecified emphysema type ([place])  ADL [facility]:  Pulmonary Assessment Scores     Row Name 03/28/20 1201 03/28/20 1551 06/02/20 1528     ADL [facility]   ADL Phase Entry -- Exit   SOB Score total 31 -- --     CAT Score   CAT Score 13 -- --     mMRC Score   mMRC Score -- 1 1    Row Name 06/07/20 0931         ADL [facility]   ADL Phase Exit     SOB Score total 23           CAT Score     CAT Score 12             Initial Exercise Prescription:  Initial Exercise Prescription - 03/28/20 1400       Date of Initial Exercise RX and Referring Provider   Date 03/28/20    Referring Provider Dr. Erin Fulling    Expected Discharge Date 06/02/20      Treadmill   MPH 2.2    Grade 0    Minutes 15      NuStep   Level 1    SPM 80    Minutes 15      Prescription Details   Frequency (times per week) 2    Duration Progress to 30 minutes of continuous aerobic without signs/symptoms of physical distress      Intensity   THRR 40-80% of Max Heartrate 60-119    Ratings of Perceived Exertion 11-13    Perceived Dyspnea 0-4      Progression   Progression Continue to progress workloads to maintain intensity without signs/symptoms of physical distress.      Resistance Training   Training Prescription Yes    Weight orange  bands    Reps 10-15             Discharge Exercise Prescription (Final Exercise Prescription Changes):  Exercise Prescription Changes - 05/24/20 1200       Response to Exercise   Blood Pressure (Admit) 118/70    Blood Pressure (Exercise) 120/78    Blood Pressure (Exit) 118/72    Heart Rate (Admit) 75 bpm    Heart Rate (Exercise) 109 bpm    Heart Rate (Exit) 77 bpm    Oxygen Saturation (Admit) 98 %    Oxygen Saturation (Exercise) 90 %    Oxygen Saturation (Exit) 96 %    Rating of Perceived Exertion (Exercise) 13    Perceived Dyspnea (Exercise) 1    Duration Continue with 30 min of aerobic exercise without signs/symptoms of physical distress.    Intensity THRR unchanged      Progression   Progression Continue to progress workloads to maintain intensity without signs/symptoms of physical distress.  Resistance Training   Training Prescription Yes    Weight orange bands    Reps 10-15    Time 10 Minutes      Treadmill   MPH 2.3    Grade 1    Minutes 15      NuStep   Level 4    SPM 80    Minutes 15    METs 2.1             Functional Capacity:  6 Minute Walk     Row Name 03/28/20 1439 06/02/20 1524       6 Minute Walk   Phase Initial Discharge    Distance 1356 feet 1348 feet    Distance % Change -- -0.59 %    Distance Feet Change -- -8 ft    Walk Time 6 minutes 6 minutes    # of Rest Breaks 0 0    MPH 2.57 2.55    METS 3.35 2.81    RPE 9 11    Perceived Dyspnea  0 0    VO2 Peak 11.74 9.83    Symptoms No No    Resting HR 75 bpm 69 bpm    Resting BP 148/88 130/80    Resting Oxygen Saturation  93 % 97 %    Exercise Oxygen Saturation  during 6 min walk 86 % 85 %    Max Ex. HR 99 bpm 98 bpm    Max Ex. BP 152/86 136/86    2 Minute Post BP 140/84 126/82         Interval HR      1 Minute HR 90 94    2 Minute HR 99 98    3 Minute HR 98 96    4 Minute HR 95 98    5 Minute HR 91 95    6 Minute HR 97 94    2 Minute Post HR 67 73    Interval  Heart Rate? Yes Yes         Interval Oxygen      Interval Oxygen? Yes Yes    Baseline Oxygen Saturation % 93 % 97 %    1 Minute Oxygen Saturation % 92 % 94 %    1 Minute Liters of Oxygen 0 L 1 L    2 Minute Oxygen Saturation % 89 % 88 %    2 Minute Liters of Oxygen 0 L 1 L    3 Minute Oxygen Saturation % 87 % 89 %    3 Minute Liters of Oxygen 1 L 1 L    4 Minute Oxygen Saturation % 91 % 85 %    4 Minute Liters of Oxygen 1 L 1 L  Increased to 2L    5 Minute Oxygen Saturation % 89 % 94 %    5 Minute Liters of Oxygen 1 L 2 L    6 Minute Oxygen Saturation % 86 % 90 %    6 Minute Liters of Oxygen 1 L 2 L    2 Minute Post Oxygen Saturation % 98 % 99 %    2 Minute Post Liters of Oxygen 1 L 2 L            Psychological, QOL, Others - Outcomes: PHQ 2/9: Depression screen [facility] 2/9 06/02/2020 03/28/2020  Decreased Interest 0 0  Down, Depressed, Hopeless 0 0  PHQ - 2 Score 0 0  Altered sleeping 0 0  Tired, decreased energy 0 1  Change  in appetite 0 0  Feeling bad or failure about yourself  0 0  Trouble concentrating 0 0  Moving slowly or fidgety/restless 0 0  Suicidal thoughts 0 0  PHQ-9 Score 0 1  Difficult doing work/chores Not difficult at all Not difficult at all    Quality of Life:   Personal Goals: Goals established at orientation with interventions provided to work toward goal.  Personal Goals and Risk Factors at Admission - 03/28/20 1205       Core Components/Risk Factors/Patient Goals on Admission   Improve shortness of breath with ADL's Yes    Intervention Provide education, individualized exercise plan and daily activity instruction to help decrease symptoms of SOB with activities of daily living.    Expected Outcomes Short Term: Improve cardiorespiratory fitness to achieve a reduction of symptoms when performing ADLs;Long Term: Be able to perform more ADLs without symptoms or delay the onset of symptoms              Personal Goals Discharge:  Goals and Risk  Factor Review     Row Name 03/28/20 1205 04/04/20 1222 04/05/20 0904 04/26/20 0909 05/23/20 1230     Core Components/Risk Factors/Patient Goals Review   Personal Goals Review Develop more efficient breathing techniques such as purse lipped breathing and diaphragmatic breathing and practicing self-pacing with activity.;Increase knowledge of respiratory medications and ability to use respiratory devices properly.;Improve shortness of breath with ADL's Develop more efficient breathing techniques such as purse lipped breathing and diaphragmatic breathing and practicing self-pacing with activity.;Increase knowledge of respiratory medications and ability to use respiratory devices properly.;Improve shortness of breath with ADL's Develop more efficient breathing techniques such as purse lipped breathing and diaphragmatic breathing and practicing self-pacing with activity.;Increase knowledge of respiratory medications and ability to use respiratory devices properly.;Improve shortness of breath with ADL's Develop more efficient breathing techniques such as purse lipped breathing and diaphragmatic breathing and practicing self-pacing with activity.;Increase knowledge of respiratory medications and ability to use respiratory devices properly.;Improve shortness of breath with ADL's Develop more efficient breathing techniques such as purse lipped breathing and diaphragmatic breathing and practicing self-pacing with activity.;Increase knowledge of respiratory medications and ability to use respiratory devices properly.;Improve shortness of breath with ADL's   Review -- Terrie will begin exercising 04/05/2020, unable to accomplish goals until she begins program. Mayte begins exercising in pulmonary rehab 04/05/2020. Jamae has attended 6 exercise sessions and is improving her deconditioning and learning how to exercise safely with her emphysema and post-covid pneumonia. Imonie requires 1 l/min of supplemental oxygen when  exercising.  She has improved her strenth and stamina and is exercising @ 2.5 mets on the nustep.   Expected Outcomes -- See admission goals. See admission goals. See admission goals. See admission goals.            Exercise Goals and Review:  Exercise Goals     Row Name 03/28/20 1435             Exercise Goals   Increase Physical Activity Yes       Intervention Provide advice, education, support and counseling about physical activity/exercise needs.;Develop an individualized exercise prescription for aerobic and resistive training based on initial evaluation findings, risk stratification, comorbidities and participant's personal goals.       Expected Outcomes Short Term: Attend rehab on a regular basis to increase amount of physical activity.;Long Term: Add in home exercise to make exercise part of routine and to increase amount of physical activity.;Long Term: Exercising regularly  at least 3-5 days a week.       Increase Strength and Stamina Yes       Intervention Provide advice, education, support and counseling about physical activity/exercise needs.;Develop an individualized exercise prescription for aerobic and resistive training based on initial evaluation findings, risk stratification, comorbidities and participant's personal goals.       Expected Outcomes Short Term: Increase workloads from initial exercise prescription for resistance, speed, and METs.;Short Term: Perform resistance training exercises routinely during rehab and add in resistance training at home;Long Term: Improve cardiorespiratory fitness, muscular endurance and strength as measured by increased METs and functional capacity (6MWT)       Able to understand and use rate of perceived exertion (RPE) scale Yes       Intervention Provide education and explanation on how to use RPE scale       Expected Outcomes Short Term: Able to use RPE daily in rehab to express subjective intensity level;Long Term:  Able to use RPE  to guide intensity level when exercising independently       Able to understand and use Dyspnea scale Yes       Intervention Provide education and explanation on how to use Dyspnea scale       Expected Outcomes Short Term: Able to use Dyspnea scale daily in rehab to express subjective sense of shortness of breath during exertion;Long Term: Able to use Dyspnea scale to guide intensity level when exercising independently       Knowledge and understanding of Target Heart Rate Range (THRR) Yes       Intervention Provide education and explanation of THRR including how the numbers were predicted and where they are located for reference       Expected Outcomes Short Term: Able to state/look up THRR;Long Term: Able to use THRR to govern intensity when exercising independently;Short Term: Able to use daily as guideline for intensity in rehab       Understanding of Exercise Prescription Yes       Intervention Provide education, explanation, and written materials on patient's individual exercise prescription       Expected Outcomes Short Term: Able to explain program exercise prescription;Long Term: Able to explain home exercise prescription to exercise independently                Exercise Goals Re-Evaluation:  Exercise Goals Re-Evaluation     Row Name 04/05/20 0830 04/29/20 1549 05/26/20 0807         Exercise Goal Re-Evaluation   Exercise Goals Review Increase Physical Activity;Increase Strength and Stamina;Able to understand and use rate of perceived exertion (RPE) scale;Able to understand and use Dyspnea scale;Knowledge and understanding of Target Heart Rate Range (THRR);Understanding of Exercise Prescription Increase Physical Activity;Increase Strength and Stamina;Able to understand and use rate of perceived exertion (RPE) scale;Able to understand and use Dyspnea scale;Knowledge and understanding of Target Heart Rate Range (THRR);Understanding of Exercise Prescription Increase Physical  Activity;Increase Strength and Stamina;Able to understand and use rate of perceived exertion (RPE) scale;Able to understand and use Dyspnea scale;Knowledge and understanding of Target Heart Rate Range (THRR);Understanding of Exercise Prescription     Comments Pt starts exercise this week and is too soon to note any progression. Will continue to monitor and follow. Seerat has completed 8 exercises sessions and has been consistent with workload and MET level increases. She is very motived to get better and to not have to rely on supplemental oxygen. She is exercising at 2.4 METS on the Nustep  and 3.0 METS on the treadmill. Will continue to monitor and progress as she is able. Laren has completed 14 exercise sessions and has been consistent with progression throughout the program. She is scheduled to complete the program next week. She is currently exercising at 2.5 METS on the Nustep and 3.1 METS on the treadmill. Will continue to monitor and progress as she is able.     Expected Outcomes Through exercise at rehab and home, the patient will decrease shortness of breath with daily activities and feel confident in carrying out an exercise regimn at home. Through exercise at rehab and home, the patient will decrease shortness of breath with daily activities and feel confident in carrying out an exercise regimn at home. For pt to continue exercise at home or a local fitness facility once completing our program and continue to decrease shortness of breath with daily activities.              Nutrition & Weight - Outcomes:  Pre Biometrics - 03/28/20 1140       Pre Biometrics   Grip Strength 30.5 kg             Post Biometrics - 06/02/20 1524        Post  Biometrics   Grip Strength 31 kg             Nutrition:  Nutrition Therapy & Goals - 04/12/20 1159       Nutrition Therapy   Diet TLC    Drug/Food Interactions Statins/Certain Fruits      Personal Nutrition Goals   Nutrition Goal  Pt to identify food quantities necessary to achieve weight loss of 5-10 lb at graduation from pulmonary rehab.    Personal Goal #2 Pt to limit added sugas <25 g/day    Personal Goal #3 Pt to build a healthy plate including vegetables, fruits, whole grains, and low-fat dairy products in a heart healthy meal plan.      Intervention Plan   Intervention Prescribe, educate and counsel regarding individualized specific dietary modifications aiming towards targeted core components such as weight, hypertension, lipid management, diabetes, heart failure and other comorbidities.;Nutrition handout(s) given to patient.    Expected Outcomes Short Term Goal: Understand basic principles of dietary content, such as calories, fat, sodium, cholesterol and nutrients.;Long Term Goal: Adherence to prescribed nutrition plan.             Nutrition Discharge:   Education Questionnaire Score:  Knowledge Questionnaire Score - 06/07/20 0930       Knowledge Questionnaire Score   Post Score 8/8   Did not feel out back of sheet            Goals reviewed with patient; copy given to patient.

## 2020-06-28 NOTE — Addendum Note (Signed)
Encounter addended by: George Ina, RD on: 06/28/2020 2:39 PM  Actions taken: Flowsheet accepted

## 2020-06-29 DIAGNOSIS — M25551 Pain in right hip: Secondary | ICD-10-CM | POA: Diagnosis not present

## 2020-06-29 DIAGNOSIS — M545 Low back pain, unspecified: Secondary | ICD-10-CM | POA: Diagnosis not present

## 2020-07-05 DIAGNOSIS — M545 Low back pain, unspecified: Secondary | ICD-10-CM | POA: Diagnosis not present

## 2020-07-05 DIAGNOSIS — M25551 Pain in right hip: Secondary | ICD-10-CM | POA: Diagnosis not present

## 2020-07-12 DIAGNOSIS — M545 Low back pain, unspecified: Secondary | ICD-10-CM | POA: Diagnosis not present

## 2020-07-12 DIAGNOSIS — M25551 Pain in right hip: Secondary | ICD-10-CM | POA: Diagnosis not present

## 2020-07-17 DIAGNOSIS — Z20822 Contact with and (suspected) exposure to covid-19: Secondary | ICD-10-CM | POA: Diagnosis not present

## 2020-07-19 DIAGNOSIS — M25551 Pain in right hip: Secondary | ICD-10-CM | POA: Diagnosis not present

## 2020-07-19 DIAGNOSIS — M545 Low back pain, unspecified: Secondary | ICD-10-CM | POA: Diagnosis not present

## 2020-07-22 DIAGNOSIS — M7061 Trochanteric bursitis, right hip: Secondary | ICD-10-CM | POA: Diagnosis not present

## 2020-07-22 DIAGNOSIS — G894 Chronic pain syndrome: Secondary | ICD-10-CM | POA: Diagnosis not present

## 2020-07-22 DIAGNOSIS — M7062 Trochanteric bursitis, left hip: Secondary | ICD-10-CM | POA: Diagnosis not present

## 2020-07-22 DIAGNOSIS — M47816 Spondylosis without myelopathy or radiculopathy, lumbar region: Secondary | ICD-10-CM | POA: Diagnosis not present

## 2020-07-26 DIAGNOSIS — M545 Low back pain, unspecified: Secondary | ICD-10-CM | POA: Diagnosis not present

## 2020-07-26 DIAGNOSIS — M25551 Pain in right hip: Secondary | ICD-10-CM | POA: Diagnosis not present

## 2020-08-02 DIAGNOSIS — M25551 Pain in right hip: Secondary | ICD-10-CM | POA: Diagnosis not present

## 2020-08-02 DIAGNOSIS — M545 Low back pain, unspecified: Secondary | ICD-10-CM | POA: Diagnosis not present

## 2020-08-09 DIAGNOSIS — M25551 Pain in right hip: Secondary | ICD-10-CM | POA: Diagnosis not present

## 2020-08-09 DIAGNOSIS — M545 Low back pain, unspecified: Secondary | ICD-10-CM | POA: Diagnosis not present

## 2020-08-15 DIAGNOSIS — L668 Other cicatricial alopecia: Secondary | ICD-10-CM | POA: Diagnosis not present

## 2020-08-15 DIAGNOSIS — D485 Neoplasm of uncertain behavior of skin: Secondary | ICD-10-CM | POA: Diagnosis not present

## 2020-08-19 ENCOUNTER — Ambulatory Visit (INDEPENDENT_AMBULATORY_CARE_PROVIDER_SITE_OTHER): Payer: Medicare Other | Admitting: Pulmonary Disease

## 2020-08-19 ENCOUNTER — Other Ambulatory Visit: Payer: Self-pay

## 2020-08-19 ENCOUNTER — Ambulatory Visit: Payer: Medicare Other

## 2020-08-19 ENCOUNTER — Encounter: Payer: Self-pay | Admitting: Pulmonary Disease

## 2020-08-19 VITALS — BP 130/62 | HR 71 | Temp 97.7°F | Ht 65.0 in | Wt 171.8 lb

## 2020-08-19 DIAGNOSIS — J9611 Chronic respiratory failure with hypoxia: Secondary | ICD-10-CM

## 2020-08-19 DIAGNOSIS — J449 Chronic obstructive pulmonary disease, unspecified: Secondary | ICD-10-CM

## 2020-08-19 NOTE — Progress Notes (Signed)
Synopsis: Referred in 01/2020 for COPD by Kelton Pillar, MD  Subjective:   PATIENT ID: Pamela Roth GENDER: female DOB: 1950-06-07, MRN: [id number]  HPI  Chief Complaint  Patient presents with   Follow-up    5 mo f/u. States she only uses 1.5-2L of O2 with exertion. Breathing has been stable since last visit.    Pamela Roth is a 70 year old woman, former smoker with COPD/Emphysema who returns to pulmonary clinic for COPD and chronic hypoxemic respiratory failure after covid 19 pneumonia in 10/2019 on supplemental oxygen.   She has been doing well since last visit and continues to use 2 to 2.5 L of supplemental oxygen with activity.  She is using Symbicort 80-4 0.5 MCG 2 puffs twice daily and has not had a need for her rescue albuterol inhaler.  She completed pulmonary rehab in the extended course and is now continuing physical activity through a local gym.  She is requesting a qualifying walk today in clinic for [facility] in order to continue her oxygen supplies.  Past Medical History:  Diagnosis Date   Anxiety    COPD (chronic obstructive pulmonary disease) (HCC)    Depression    Hyperlipidemia    Hypertension    Multinodular goiter      Family History  Problem Relation Age of Onset   Schizophrenia Mother    Dementia Mother      Social History   Socioeconomic History   Marital status: Widowed    Spouse name: Not on file   Number of children: 1   Years of education: some college   Highest education level: Some college, no degree  Occupational History   Not on file  Tobacco Use   Smoking status: Former    Types: Cigarettes    Quit date: 01/09/2008    Years since quitting: 12.6   Smokeless tobacco: Never  Vaping Use   Vaping Use: Never used  Substance and Sexual Activity   Alcohol use: No    Alcohol/week: 0.0 standard drinks   Drug use: No   Sexual activity: Not Currently  Other Topics Concern   Not on file  Social History Narrative   Lives at home  alone   Right handed   Caffeine: drinks 3 cups daily   Social Determinants of Health   Financial Resource Strain: Not on file  Food Insecurity: Not on file  Transportation Needs: Not on file  Physical Activity: Not on file  Stress: Not on file  Social Connections: Not on file  Intimate Partner Violence: Not on file     No Known Allergies   Outpatient Medications Prior to Visit  Medication Sig Dispense Refill   diclofenac Sodium (VOLTAREN) 1 % GEL Apply topically.     DULoxetine (CYMBALTA) 30 MG capsule Take 30 mg by mouth daily.     losartan (COZAAR) 50 MG tablet Take 50 mg by mouth daily.     Multiple Vitamins-Minerals (ONE-A-DAY PROACTIVE 65+) TABS Take 1 tablet by mouth daily with breakfast.     simvastatin (ZOCOR) 20 MG tablet Take 20 mg by mouth daily.   5   SYMBICORT 80-4.5 MCG/ACT inhaler      acetaminophen (TYLENOL) 500 MG tablet Take 1,000 mg by mouth every 6 (six) hours as needed (for hip pain or mild headaches). (Patient not taking: Reported on 03/28/2020)     albuterol (VENTOLIN HFA) 108 (90 Base) MCG/ACT inhaler Inhale 2 puffs into the lungs every 6 (six) hours as needed for  wheezing or shortness of breath. (Patient not taking: Reported on 03/28/2020) 6.7 g 0   Camphor-Menthol-Methyl Sal (SALONPAS EX) Place 1 patch onto the skin See admin instructions. Apply 1 patch one to two times a day as needed for hip pain (remove old patch first) (Patient not taking: Reported on 03/28/2020)     Fluticasone-Umeclidin-Vilant (TRELEGY ELLIPTA) 100-62.5-25 MCG/INH AEPB Inhale 1 puff into the lungs daily. 28 each 6   meloxicam (MOBIC) 7.5 MG tablet Take 15 mg by mouth daily. (Patient not taking: Reported on 03/28/2020)     naphazoline-glycerin (CLEAR EYES REDNESS) 0.012-0.2 % SOLN Place 1 drop into both eyes 4 (four) times daily as needed for eye irritation. (Patient not taking: Reported on 03/28/2020)     sodium chloride (OCEAN) 0.65 % SOLN nasal spray Place 1 spray into both nostrils as  needed for congestion. (Patient not taking: Reported on 03/28/2020) 30 mL 0   No facility-administered medications prior to visit.   Review of Systems  Constitutional:  Negative for chills, fever, malaise/fatigue and weight loss.  HENT:  Negative for congestion, sinus pain and sore throat.   Eyes: Negative.   Respiratory:  Positive for shortness of breath. Negative for cough, hemoptysis, sputum production and wheezing.   Cardiovascular:  Negative for chest pain, palpitations, orthopnea, claudication, leg swelling and PND.  Gastrointestinal:  Negative for heartburn, nausea and vomiting.  Genitourinary: Negative.   Musculoskeletal:  Negative for joint pain and myalgias.  Skin:  Negative for rash.  Neurological:  Negative for dizziness, weakness and headaches.  Endo/Heme/Allergies: Negative.   Psychiatric/Behavioral: Negative.     Objective:   Vitals:   08/19/20 1158  BP: 130/62  Pulse: 71  Temp: 97.7 F (36.5 C)  TempSrc: Oral  SpO2: 94%  Weight: 171 lb 12.8 oz (77.9 kg)  Height: '5\' 5"'$  (1.651 m)   Physical Exam Constitutional:      General: She is not in acute distress.    Appearance: She is normal weight. She is not ill-appearing.  HENT:     Head: Normocephalic and atraumatic.     Nose: Nose normal.     Mouth/Throat:     Mouth: Mucous membranes are moist.     Pharynx: Oropharynx is clear.  Eyes:     General: No scleral icterus.    Conjunctiva/sclera: Conjunctivae normal.     Pupils: Pupils are equal, round, and reactive to light.  Cardiovascular:     Rate and Rhythm: Normal rate and regular rhythm.     Pulses: Normal pulses.     Heart sounds: Normal heart sounds. No murmur heard. Pulmonary:     Effort: Pulmonary effort is normal.     Breath sounds: Normal breath sounds. No wheezing, rhonchi or rales.  Abdominal:     General: Bowel sounds are normal.     Palpations: Abdomen is soft.  Musculoskeletal:     Right lower leg: No edema.     Left lower leg: No edema.   Lymphadenopathy:     Cervical: No cervical adenopathy.  Skin:    General: Skin is warm and dry.     Capillary Refill: Capillary refill takes less than 2 seconds.  Neurological:     General: No focal deficit present.     Mental Status: She is alert.   CBC    Component Value Date/Time   WBC 8.8 11/02/2019 0513   RBC 4.52 11/02/2019 0513   HGB 13.7 11/02/2019 0513   HCT 40.1 11/02/2019 0513   PLT 229  11/02/2019 0513   MCV 88.7 11/02/2019 0513   MCH 30.3 11/02/2019 0513   MCHC 34.2 11/02/2019 0513   RDW 15.4 11/02/2019 0513   LYMPHSABS 1.0 11/02/2019 0513   MONOABS 0.3 11/02/2019 0513   EOSABS 0.0 11/02/2019 0513   BASOSABS 0.0 11/02/2019 0513   BMP Latest Ref Rng & Units 11/02/2019 11/01/2019 10/31/2019  Glucose 70 - 99 mg/dL 163(H) 151(H) 135(H)  BUN 8 - 23 mg/dL 24(H) 25(H) 26(H)  Creatinine 0.44 - 1.00 mg/dL 0.91 0.94 0.81  Sodium 135 - 145 mmol/L 137 139 138  Potassium 3.5 - 5.1 mmol/L 4.3 4.1 4.5  Chloride 98 - 111 mmol/L 106 105 109  CO2 22 - 32 mmol/L '24 25 23  '$ Calcium 8.9 - 10.3 mg/dL 8.9 9.0 8.9   Chest imaging: CXR 11/11/19 The cardiomediastinal silhouette is unchanged and mildly enlarged in contour.Atherosclerotic calcifications of the aorta. No pleural effusion. No pneumothorax. Increased peripheral predominant coarse reticular and consolidative opacities. Visualized abdomen is unremarkable. Chronic mild wedging of a vertebral body at the thoracolumbar junction.  CTA Chest 10/28/2019 Cardiovascular: No filling defects in the pulmonary arteries to suggest pulmonary emboli. Aortic atherosclerosis and coronary artery calcifications. Heart is normal size. Aorta is normal caliber.   Mediastinum/Nodes: No mediastinal, hilar, or axillary adenopathy. Trachea and esophagus are unremarkable. Left thyroid nodule measuring up to 2.1 cm.   Lungs/Pleura: Emphysema. Interstitial thickening peripherally in the lungs most compatible with fibrosis. Dependent and  bibasilar atelectasis. No effusions.   Upper Abdomen: Imaging into the upper abdomen demonstrates no acute findings.   Musculoskeletal: Chest wall soft tissues are unremarkable. No acute bony abnormality.  PFT: PFT Results Latest Ref Rng & Units 03/22/2020  FVC-Pre L 3.25  FVC-Predicted Pre % 126  FVC-Post L 3.25  FVC-Predicted Post % 126  Pre FEV1/FVC % % 76  Post FEV1/FCV % % 73  FEV1-Pre L 2.48  FEV1-Predicted Pre % 124  FEV1-Post L 2.37  DLCO uncorrected ml/min/mmHg 9.64  DLCO UNC% % 46  DLCO corrected ml/min/mmHg 9.64  DLCO COR %Predicted % 46  DLVA Predicted % 54  TLC L 4.15  TLC % Predicted % 77  RV % Predicted % 40       Assessment & Plan:   Chronic obstructive pulmonary disease, unspecified COPD type ([place]) - Plan: Pulse oximetry, overnight  Chronic respiratory failure with hypoxia ([place]) - Plan: 6 minute walk  Discussion: Pamela Roth is a 70 year old woman, former smoker with COPD/Emphysema who returns to pulmonary clinic for COPD and chronic hypoxemic respiratory failure after covid 19 pneumonia in 10/2019 on supplemental oxygen.    She did not have SPO2 below 88% on simple walk in clinic today.  We will schedule her for 6-minute walk test to further assess her need for supplemental oxygen with ambulation and extended activity.  We will also schedule her for overnight oximetry on room air to determine a need for supplemental oxygen at night when sleeping.  She is to continue Symbicort inhaler 80-4 0.5 MCG 2 puffs twice daily.  And as needed albuterol.   Follow up in 6 months.  Freda Jackson, MD [facility] Office: [phone]     Current Outpatient Medications:    diclofenac Sodium (VOLTAREN) 1 % GEL, Apply topically., Disp: , Rfl:    DULoxetine (CYMBALTA) 30 MG capsule, Take 30 mg by mouth daily., Disp: , Rfl:    losartan (COZAAR) 50 MG tablet, Take 50 mg by mouth daily., Disp: , Rfl:  Multiple Vitamins-Minerals  (ONE-A-DAY PROACTIVE 65+) TABS, Take 1 tablet by mouth daily with breakfast., Disp: , Rfl:    simvastatin (ZOCOR) 20 MG tablet, Take 20 mg by mouth daily. , Disp: , Rfl: 5   SYMBICORT 80-4.5 MCG/ACT inhaler, , Disp: , Rfl:

## 2020-08-19 NOTE — Progress Notes (Signed)
Pt came in for a second qualifying walk to see if she still needed her O2.  After the second lap, pt's sats did drop to 87% and pt was placed on 2L O2.  Pt was walked another lap on the 2L to make sure her sats remained stable. Pt's sats remained stable on 2L O2.  Order has been placed showing that pt still needs O2 after the qualifying walk that was done. Sending to Dr. Erin Fulling.

## 2020-08-19 NOTE — Patient Instructions (Addendum)
Continue Symbicort 2 puffs twice daily and albuterol inhaler as needed  We will perform a qualifying walk today to check your oxygen needs. You did not drop below 88% on your walk today and do not require supplemental oxygen therapy during the day.   We will schedule you for an overnight oximetry test to determine if you still need oxygen when you sleep.

## 2020-08-22 DIAGNOSIS — M7062 Trochanteric bursitis, left hip: Secondary | ICD-10-CM | POA: Diagnosis not present

## 2020-08-22 DIAGNOSIS — M7061 Trochanteric bursitis, right hip: Secondary | ICD-10-CM | POA: Diagnosis not present

## 2020-08-22 DIAGNOSIS — G894 Chronic pain syndrome: Secondary | ICD-10-CM | POA: Diagnosis not present

## 2020-08-22 DIAGNOSIS — M47816 Spondylosis without myelopathy or radiculopathy, lumbar region: Secondary | ICD-10-CM | POA: Diagnosis not present

## 2020-08-30 DIAGNOSIS — R0683 Snoring: Secondary | ICD-10-CM | POA: Diagnosis not present

## 2020-08-30 DIAGNOSIS — G4730 Sleep apnea, unspecified: Secondary | ICD-10-CM | POA: Diagnosis not present

## 2020-09-22 DIAGNOSIS — F4312 Post-traumatic stress disorder, chronic: Secondary | ICD-10-CM | POA: Diagnosis not present

## 2020-09-29 ENCOUNTER — Telehealth: Payer: Self-pay | Admitting: Pulmonary Disease

## 2020-09-29 NOTE — Telephone Encounter (Signed)
Patient's ONO shows she continues to qualify for oxygen at night as she spent 2hr 82min with an SpO2 less than 88%. Please let her know these results.  Thanks, Wille Glaser

## 2020-10-03 DIAGNOSIS — M7062 Trochanteric bursitis, left hip: Secondary | ICD-10-CM | POA: Diagnosis not present

## 2020-10-03 DIAGNOSIS — G894 Chronic pain syndrome: Secondary | ICD-10-CM | POA: Diagnosis not present

## 2020-10-03 DIAGNOSIS — M7061 Trochanteric bursitis, right hip: Secondary | ICD-10-CM | POA: Diagnosis not present

## 2020-10-03 DIAGNOSIS — M47816 Spondylosis without myelopathy or radiculopathy, lumbar region: Secondary | ICD-10-CM | POA: Diagnosis not present

## 2020-10-05 DIAGNOSIS — F4312 Post-traumatic stress disorder, chronic: Secondary | ICD-10-CM | POA: Diagnosis not present

## 2020-10-06 DIAGNOSIS — L600 Ingrowing nail: Secondary | ICD-10-CM | POA: Diagnosis not present

## 2020-10-06 DIAGNOSIS — M21611 Bunion of right foot: Secondary | ICD-10-CM | POA: Diagnosis not present

## 2020-10-06 DIAGNOSIS — M79671 Pain in right foot: Secondary | ICD-10-CM | POA: Diagnosis not present

## 2020-10-07 NOTE — Telephone Encounter (Signed)
Called and spoke with patient. She is aware to continue the O2 at night.   Nothing further needed at time of call.

## 2020-10-13 DIAGNOSIS — F4312 Post-traumatic stress disorder, chronic: Secondary | ICD-10-CM | POA: Diagnosis not present

## 2020-10-20 DIAGNOSIS — F4312 Post-traumatic stress disorder, chronic: Secondary | ICD-10-CM | POA: Diagnosis not present

## 2020-10-22 DIAGNOSIS — Z23 Encounter for immunization: Secondary | ICD-10-CM | POA: Diagnosis not present

## 2020-10-26 DIAGNOSIS — M79675 Pain in left toe(s): Secondary | ICD-10-CM | POA: Diagnosis not present

## 2020-10-26 DIAGNOSIS — M79674 Pain in right toe(s): Secondary | ICD-10-CM | POA: Diagnosis not present

## 2020-10-26 DIAGNOSIS — B351 Tinea unguium: Secondary | ICD-10-CM | POA: Diagnosis not present

## 2020-10-26 DIAGNOSIS — L600 Ingrowing nail: Secondary | ICD-10-CM | POA: Diagnosis not present

## 2020-10-27 DIAGNOSIS — I10 Essential (primary) hypertension: Secondary | ICD-10-CM | POA: Diagnosis not present

## 2020-10-27 DIAGNOSIS — M1990 Unspecified osteoarthritis, unspecified site: Secondary | ICD-10-CM | POA: Diagnosis not present

## 2020-10-27 DIAGNOSIS — L659 Nonscarring hair loss, unspecified: Secondary | ICD-10-CM | POA: Diagnosis not present

## 2020-10-27 DIAGNOSIS — F419 Anxiety disorder, unspecified: Secondary | ICD-10-CM | POA: Diagnosis not present

## 2020-10-27 DIAGNOSIS — J9691 Respiratory failure, unspecified with hypoxia: Secondary | ICD-10-CM | POA: Diagnosis not present

## 2020-10-27 DIAGNOSIS — Z0000 Encounter for general adult medical examination without abnormal findings: Secondary | ICD-10-CM | POA: Diagnosis not present

## 2020-10-27 DIAGNOSIS — Z1389 Encounter for screening for other disorder: Secondary | ICD-10-CM | POA: Diagnosis not present

## 2020-10-27 DIAGNOSIS — E7800 Pure hypercholesterolemia, unspecified: Secondary | ICD-10-CM | POA: Diagnosis not present

## 2020-10-27 DIAGNOSIS — F4312 Post-traumatic stress disorder, chronic: Secondary | ICD-10-CM | POA: Diagnosis not present

## 2020-10-27 DIAGNOSIS — M545 Low back pain, unspecified: Secondary | ICD-10-CM | POA: Diagnosis not present

## 2020-10-27 DIAGNOSIS — J449 Chronic obstructive pulmonary disease, unspecified: Secondary | ICD-10-CM | POA: Diagnosis not present

## 2020-10-27 DIAGNOSIS — E042 Nontoxic multinodular goiter: Secondary | ICD-10-CM | POA: Diagnosis not present

## 2020-10-27 DIAGNOSIS — B948 Sequelae of other specified infectious and parasitic diseases: Secondary | ICD-10-CM | POA: Diagnosis not present

## 2020-10-31 DIAGNOSIS — H1013 Acute atopic conjunctivitis, bilateral: Secondary | ICD-10-CM | POA: Diagnosis not present

## 2020-10-31 DIAGNOSIS — H2513 Age-related nuclear cataract, bilateral: Secondary | ICD-10-CM | POA: Diagnosis not present

## 2020-10-31 DIAGNOSIS — H16223 Keratoconjunctivitis sicca, not specified as Sjogren's, bilateral: Secondary | ICD-10-CM | POA: Diagnosis not present

## 2020-10-31 DIAGNOSIS — Z800 Family history of malignant neoplasm of digestive organs: Secondary | ICD-10-CM | POA: Diagnosis not present

## 2020-11-03 DIAGNOSIS — F4312 Post-traumatic stress disorder, chronic: Secondary | ICD-10-CM | POA: Diagnosis not present

## 2020-11-10 DIAGNOSIS — F4312 Post-traumatic stress disorder, chronic: Secondary | ICD-10-CM | POA: Diagnosis not present

## 2020-11-14 ENCOUNTER — Telehealth: Payer: Self-pay | Admitting: Pulmonary Disease

## 2020-11-14 DIAGNOSIS — G894 Chronic pain syndrome: Secondary | ICD-10-CM | POA: Diagnosis not present

## 2020-11-14 DIAGNOSIS — M7062 Trochanteric bursitis, left hip: Secondary | ICD-10-CM | POA: Diagnosis not present

## 2020-11-14 DIAGNOSIS — M47816 Spondylosis without myelopathy or radiculopathy, lumbar region: Secondary | ICD-10-CM | POA: Diagnosis not present

## 2020-11-14 DIAGNOSIS — M7061 Trochanteric bursitis, right hip: Secondary | ICD-10-CM | POA: Diagnosis not present

## 2020-11-14 NOTE — Telephone Encounter (Signed)
Will forward to [place] to complete this once he is back in the office.

## 2020-11-15 NOTE — Telephone Encounter (Signed)
Received form from front desk. Will fill out form for JD to sign.

## 2020-11-15 NOTE — Telephone Encounter (Signed)
Forms have been completed and signed by JD. Called patient but she did not answer. Left message to let her know that the forms were ready. Will hold onto forms until I hear back from her.

## 2020-11-17 DIAGNOSIS — F4312 Post-traumatic stress disorder, chronic: Secondary | ICD-10-CM | POA: Diagnosis not present

## 2020-11-22 DIAGNOSIS — L668 Other cicatricial alopecia: Secondary | ICD-10-CM | POA: Diagnosis not present

## 2020-11-22 DIAGNOSIS — L661 Lichen planopilaris: Secondary | ICD-10-CM | POA: Diagnosis not present

## 2020-11-23 DIAGNOSIS — L600 Ingrowing nail: Secondary | ICD-10-CM | POA: Diagnosis not present

## 2020-11-23 DIAGNOSIS — M79675 Pain in left toe(s): Secondary | ICD-10-CM | POA: Diagnosis not present

## 2020-11-23 DIAGNOSIS — B351 Tinea unguium: Secondary | ICD-10-CM | POA: Diagnosis not present

## 2020-11-23 DIAGNOSIS — M79674 Pain in right toe(s): Secondary | ICD-10-CM | POA: Diagnosis not present

## 2020-11-24 DIAGNOSIS — F4312 Post-traumatic stress disorder, chronic: Secondary | ICD-10-CM | POA: Diagnosis not present

## 2020-11-24 NOTE — Telephone Encounter (Signed)
Called and spoke to pt. Pt states she already picked up the form. Nothing further needed.

## 2020-12-08 DIAGNOSIS — F4312 Post-traumatic stress disorder, chronic: Secondary | ICD-10-CM | POA: Diagnosis not present

## 2020-12-15 DIAGNOSIS — F4312 Post-traumatic stress disorder, chronic: Secondary | ICD-10-CM | POA: Diagnosis not present

## 2020-12-19 DIAGNOSIS — Z1231 Encounter for screening mammogram for malignant neoplasm of breast: Secondary | ICD-10-CM | POA: Diagnosis not present

## 2020-12-19 DIAGNOSIS — Z780 Asymptomatic menopausal state: Secondary | ICD-10-CM | POA: Diagnosis not present

## 2020-12-20 DIAGNOSIS — L600 Ingrowing nail: Secondary | ICD-10-CM | POA: Diagnosis not present

## 2020-12-20 DIAGNOSIS — M79675 Pain in left toe(s): Secondary | ICD-10-CM | POA: Diagnosis not present

## 2020-12-20 DIAGNOSIS — B351 Tinea unguium: Secondary | ICD-10-CM | POA: Diagnosis not present

## 2020-12-20 DIAGNOSIS — M79674 Pain in right toe(s): Secondary | ICD-10-CM | POA: Diagnosis not present

## 2020-12-22 DIAGNOSIS — F4312 Post-traumatic stress disorder, chronic: Secondary | ICD-10-CM | POA: Diagnosis not present

## 2020-12-29 DIAGNOSIS — F4312 Post-traumatic stress disorder, chronic: Secondary | ICD-10-CM | POA: Diagnosis not present

## 2021-01-12 DIAGNOSIS — F4312 Post-traumatic stress disorder, chronic: Secondary | ICD-10-CM | POA: Diagnosis not present

## 2021-01-14 DIAGNOSIS — Z20822 Contact with and (suspected) exposure to covid-19: Secondary | ICD-10-CM | POA: Diagnosis not present

## 2021-01-16 DIAGNOSIS — B351 Tinea unguium: Secondary | ICD-10-CM | POA: Diagnosis not present

## 2021-01-16 DIAGNOSIS — M79674 Pain in right toe(s): Secondary | ICD-10-CM | POA: Diagnosis not present

## 2021-01-16 DIAGNOSIS — L600 Ingrowing nail: Secondary | ICD-10-CM | POA: Diagnosis not present

## 2021-01-16 DIAGNOSIS — M79675 Pain in left toe(s): Secondary | ICD-10-CM | POA: Diagnosis not present

## 2021-01-17 ENCOUNTER — Telehealth: Payer: Self-pay | Admitting: Pulmonary Disease

## 2021-01-17 DIAGNOSIS — J9611 Chronic respiratory failure with hypoxia: Secondary | ICD-10-CM

## 2021-01-18 NOTE — Telephone Encounter (Signed)
Call made to patient, confirmed DOB. Patient states she is working with [employer] to get a portable oxygen concentrator and she would like the order to be sent in.   JD please advise if okay to send order. Pt aware response will be delayed until your return.

## 2021-01-19 DIAGNOSIS — F4312 Post-traumatic stress disorder, chronic: Secondary | ICD-10-CM | POA: Diagnosis not present

## 2021-01-19 NOTE — Telephone Encounter (Signed)
Called and spoke with patient. She is aware that JD is ok with the POC order. Will send order to [employer] as requested.   Nothing further needed at time of call.

## 2021-01-23 DIAGNOSIS — G894 Chronic pain syndrome: Secondary | ICD-10-CM | POA: Diagnosis not present

## 2021-01-23 DIAGNOSIS — M7061 Trochanteric bursitis, right hip: Secondary | ICD-10-CM | POA: Diagnosis not present

## 2021-01-23 DIAGNOSIS — M7062 Trochanteric bursitis, left hip: Secondary | ICD-10-CM | POA: Diagnosis not present

## 2021-01-23 DIAGNOSIS — M47816 Spondylosis without myelopathy or radiculopathy, lumbar region: Secondary | ICD-10-CM | POA: Diagnosis not present

## 2021-01-24 NOTE — Telephone Encounter (Signed)
Called patient. She stated that she called [employer] to check on the status of her POC. Per patient, [facility] stated that they did not receive the order. I reviewed her chart and we received a message from [place] on 01/13 stating that they had received the order. She is aware that I will call [employer] to confirm this.   Called and spoke with Shakera at [facility]. She confirmed that they have received the order and it is currently being processed. She will have someone from the local office to call the patient to let her know.   Nothing further needed at time of call.

## 2021-01-25 NOTE — Telephone Encounter (Signed)
Pamela Roth  Ahkeem Goede, Waldemar Dickens, [place]; Winnifred Friar, [place]! She qualifies for the [facility] tanks, but want the POC. I told Mrs. [place] she would have to pay out of pocket cost for a POC, But we can get her The smaller tanks right away.      Called and spoke with pt letting her know that info and she verbalized understanding and stated that she would give [employer] a call. Nothing further needed.

## 2021-01-25 NOTE — Telephone Encounter (Signed)
Called and spoke with pt letting her know that an order was sent to [employer] for the POC and that they did confirm with [place] that they had the order.  Pt stated that she had just got off the phone with [employer] that they were needing to have approval from our office still before they are able to provide her with a POC. Stated to pt that I was going to reach out to [employer] about this and would let her know what we found out and she verbalized understanding.  Community message sent to [employer]. Will await response.

## 2021-01-26 DIAGNOSIS — F4312 Post-traumatic stress disorder, chronic: Secondary | ICD-10-CM | POA: Diagnosis not present

## 2021-01-27 NOTE — Telephone Encounter (Signed)
Called and spoke with patient. She stated that she went to [facility] to pick up her POC and they only had one in stock. She was told that the POC they had was very loud and other patients had refused it. She refused it as well. She wanted to know if we knew of any other DMEs that sell POCs. I informed her about [employer].   She also stated that she had been in touch with another DME company and they had started asking her questions that she could not answer such as what the liter flow should be and the battery life. She is aware that she will use 2L of O2.   I advised her to call us back if the company she is working with is able to provide her with a  POC. She verbalized understanding.   Nothing further needed at time of call.

## 2021-02-01 ENCOUNTER — Telehealth: Payer: Self-pay | Admitting: Pulmonary Disease

## 2021-02-01 NOTE — Telephone Encounter (Signed)
Spoke to [employer].  She will attach records for this order.

## 2021-02-02 DIAGNOSIS — F4312 Post-traumatic stress disorder, chronic: Secondary | ICD-10-CM | POA: Diagnosis not present

## 2021-02-06 ENCOUNTER — Telehealth: Payer: Self-pay | Admitting: Pulmonary Disease

## 2021-02-06 NOTE — Telephone Encounter (Signed)
Can we get a new order that does not have [employer] in it to send to them

## 2021-02-06 NOTE — Telephone Encounter (Signed)
I spoke to [name] earlier today and he stated that all was needed was last OV note stating that patient wears oxygen.  I have faxed this to sean.

## 2021-02-06 NOTE — Telephone Encounter (Signed)
[facility] Shaun Gregory, need to know what type of paperwork is needed.

## 2021-02-06 NOTE — Telephone Encounter (Signed)
Spoke to [place] with oxygen Haematologist.  He stated that patient is wanting to purchase a POC. Per 01/17/2021 phone note, Dr. Erin Fulling gave approval for this.  Spoke to patient and verified that she wished to purchase POC from the oxygen concentrator store.  Last OV note has been faxed to [phone] per [name]'s request.  Nothing further needed at this time.

## 2021-02-07 DIAGNOSIS — H43811 Vitreous degeneration, right eye: Secondary | ICD-10-CM | POA: Diagnosis not present

## 2021-02-07 DIAGNOSIS — H16223 Keratoconjunctivitis sicca, not specified as Sjogren's, bilateral: Secondary | ICD-10-CM | POA: Diagnosis not present

## 2021-02-07 DIAGNOSIS — H2513 Age-related nuclear cataract, bilateral: Secondary | ICD-10-CM | POA: Diagnosis not present

## 2021-02-07 DIAGNOSIS — H1013 Acute atopic conjunctivitis, bilateral: Secondary | ICD-10-CM | POA: Diagnosis not present

## 2021-02-09 DIAGNOSIS — F4312 Post-traumatic stress disorder, chronic: Secondary | ICD-10-CM | POA: Diagnosis not present

## 2021-02-17 DIAGNOSIS — F4312 Post-traumatic stress disorder, chronic: Secondary | ICD-10-CM | POA: Diagnosis not present

## 2021-03-02 DIAGNOSIS — F4312 Post-traumatic stress disorder, chronic: Secondary | ICD-10-CM | POA: Diagnosis not present

## 2021-03-13 DIAGNOSIS — H16223 Keratoconjunctivitis sicca, not specified as Sjogren's, bilateral: Secondary | ICD-10-CM | POA: Diagnosis not present

## 2021-03-13 DIAGNOSIS — H1013 Acute atopic conjunctivitis, bilateral: Secondary | ICD-10-CM | POA: Diagnosis not present

## 2021-03-13 DIAGNOSIS — H43811 Vitreous degeneration, right eye: Secondary | ICD-10-CM | POA: Diagnosis not present

## 2021-03-13 DIAGNOSIS — H2513 Age-related nuclear cataract, bilateral: Secondary | ICD-10-CM | POA: Diagnosis not present

## 2021-03-16 DIAGNOSIS — F4312 Post-traumatic stress disorder, chronic: Secondary | ICD-10-CM | POA: Diagnosis not present

## 2021-03-20 DIAGNOSIS — Z79891 Long term (current) use of opiate analgesic: Secondary | ICD-10-CM | POA: Diagnosis not present

## 2021-03-20 DIAGNOSIS — M47816 Spondylosis without myelopathy or radiculopathy, lumbar region: Secondary | ICD-10-CM | POA: Diagnosis not present

## 2021-03-20 DIAGNOSIS — M7061 Trochanteric bursitis, right hip: Secondary | ICD-10-CM | POA: Diagnosis not present

## 2021-03-20 DIAGNOSIS — M7062 Trochanteric bursitis, left hip: Secondary | ICD-10-CM | POA: Diagnosis not present

## 2021-03-20 DIAGNOSIS — G894 Chronic pain syndrome: Secondary | ICD-10-CM | POA: Diagnosis not present

## 2021-03-24 ENCOUNTER — Telehealth: Payer: Self-pay | Admitting: Pulmonary Disease

## 2021-03-24 NOTE — Telephone Encounter (Signed)
Called and spoke with patient. She stated that she received her POC from the [facility]. She described the unit as a [employer] Simply Go Mini.  ? ?She stated that she feels like the machine is not working well. She is having to increase the O2 to 5L in order to feel any oxygen coming out of the nasal cannula.  ? ?She was walked in our office back in August and only needed 2L.  ? ?She denied being sick. Stated that she feels like her normal self. She wonders if the unit if defective.  ? ?She stated that she has not been in contact with Hilliard Clark at the [facility]. Advised her I would see if I could give him a call.  ? ?Called and spoke with Hilliard Clark and explained to him the situation. He stated that he will have the customer service department to reach out to her asap.  ? ?Will leave this encounter open for follow up.  ?

## 2021-03-29 DIAGNOSIS — Z20822 Contact with and (suspected) exposure to covid-19: Secondary | ICD-10-CM | POA: Diagnosis not present

## 2021-03-30 ENCOUNTER — Telehealth: Payer: Self-pay | Admitting: Pulmonary Disease

## 2021-03-30 DIAGNOSIS — F4312 Post-traumatic stress disorder, chronic: Secondary | ICD-10-CM | POA: Diagnosis not present

## 2021-03-31 NOTE — Telephone Encounter (Signed)
Spoke with pt who states POC is working correctly according to [place]. DME recommended that pt come into office for qualifying  walk as pt states she is now having to turn POC up to 5 and is still not able to maintain O2 sats above 90%. Pt was scheduled on 6 min walk schedule on Monday March 28th, 2023. Nothing further needed at this time.  ? ?Routing to Dr. Erin Fulling as Juluis Rainier  ? ? ?

## 2021-03-31 NOTE — Telephone Encounter (Signed)
thanks

## 2021-04-03 ENCOUNTER — Ambulatory Visit (INDEPENDENT_AMBULATORY_CARE_PROVIDER_SITE_OTHER): Payer: Medicare Other

## 2021-04-03 ENCOUNTER — Other Ambulatory Visit: Payer: Self-pay

## 2021-04-03 DIAGNOSIS — M170 Bilateral primary osteoarthritis of knee: Secondary | ICD-10-CM | POA: Diagnosis not present

## 2021-04-03 DIAGNOSIS — J449 Chronic obstructive pulmonary disease, unspecified: Secondary | ICD-10-CM

## 2021-04-03 NOTE — Progress Notes (Signed)
Patient ambulated at a moderate to fast pace for 8 complete laps.  O2 sats were checked continuously on POC and O2 was adjusted to maintain O2 sats 90% or above.  Patient is able to check O2 at home and encourage her to continue to monitor O2 sats and  adjust O2 if it drops below 90%.  Explained if sats drop below 90% on 5L POC, she should use continuous O2 from O2 tank.  Explained she should monitor O2 and adjust as needed for inclines, stairs, or hills.  Advised patient if she ever thinks her POC is not working properly to contact her DME for maintenance.  Advised patient to contact office as needed for any questions or concerns. ?

## 2021-04-06 DIAGNOSIS — F4312 Post-traumatic stress disorder, chronic: Secondary | ICD-10-CM | POA: Diagnosis not present

## 2021-04-11 DIAGNOSIS — F4312 Post-traumatic stress disorder, chronic: Secondary | ICD-10-CM | POA: Diagnosis not present

## 2021-04-17 DIAGNOSIS — B351 Tinea unguium: Secondary | ICD-10-CM | POA: Diagnosis not present

## 2021-04-17 DIAGNOSIS — M79675 Pain in left toe(s): Secondary | ICD-10-CM | POA: Diagnosis not present

## 2021-04-17 DIAGNOSIS — M7062 Trochanteric bursitis, left hip: Secondary | ICD-10-CM | POA: Diagnosis not present

## 2021-04-17 DIAGNOSIS — M7061 Trochanteric bursitis, right hip: Secondary | ICD-10-CM | POA: Diagnosis not present

## 2021-04-17 DIAGNOSIS — M79674 Pain in right toe(s): Secondary | ICD-10-CM | POA: Diagnosis not present

## 2021-04-17 DIAGNOSIS — G894 Chronic pain syndrome: Secondary | ICD-10-CM | POA: Diagnosis not present

## 2021-04-17 DIAGNOSIS — M47816 Spondylosis without myelopathy or radiculopathy, lumbar region: Secondary | ICD-10-CM | POA: Diagnosis not present

## 2021-04-17 DIAGNOSIS — L600 Ingrowing nail: Secondary | ICD-10-CM | POA: Diagnosis not present

## 2021-04-26 DIAGNOSIS — M79675 Pain in left toe(s): Secondary | ICD-10-CM | POA: Diagnosis not present

## 2021-04-26 DIAGNOSIS — L600 Ingrowing nail: Secondary | ICD-10-CM | POA: Diagnosis not present

## 2021-04-26 DIAGNOSIS — B351 Tinea unguium: Secondary | ICD-10-CM | POA: Diagnosis not present

## 2021-04-26 DIAGNOSIS — M79674 Pain in right toe(s): Secondary | ICD-10-CM | POA: Diagnosis not present

## 2021-04-27 DIAGNOSIS — F4312 Post-traumatic stress disorder, chronic: Secondary | ICD-10-CM | POA: Diagnosis not present

## 2021-05-06 DIAGNOSIS — Z20822 Contact with and (suspected) exposure to covid-19: Secondary | ICD-10-CM | POA: Diagnosis not present

## 2021-05-10 DIAGNOSIS — F329 Major depressive disorder, single episode, unspecified: Secondary | ICD-10-CM | POA: Diagnosis not present

## 2021-05-10 DIAGNOSIS — I10 Essential (primary) hypertension: Secondary | ICD-10-CM | POA: Diagnosis not present

## 2021-05-10 DIAGNOSIS — J449 Chronic obstructive pulmonary disease, unspecified: Secondary | ICD-10-CM | POA: Diagnosis not present

## 2021-05-10 DIAGNOSIS — M542 Cervicalgia: Secondary | ICD-10-CM | POA: Diagnosis not present

## 2021-05-10 DIAGNOSIS — Z23 Encounter for immunization: Secondary | ICD-10-CM | POA: Diagnosis not present

## 2021-05-10 DIAGNOSIS — E7800 Pure hypercholesterolemia, unspecified: Secondary | ICD-10-CM | POA: Diagnosis not present

## 2021-05-10 DIAGNOSIS — J9611 Chronic respiratory failure with hypoxia: Secondary | ICD-10-CM | POA: Diagnosis not present

## 2021-05-10 DIAGNOSIS — Z20822 Contact with and (suspected) exposure to covid-19: Secondary | ICD-10-CM | POA: Diagnosis not present

## 2021-05-10 DIAGNOSIS — B351 Tinea unguium: Secondary | ICD-10-CM | POA: Diagnosis not present

## 2021-05-10 DIAGNOSIS — B360 Pityriasis versicolor: Secondary | ICD-10-CM | POA: Diagnosis not present

## 2021-05-11 DIAGNOSIS — F4312 Post-traumatic stress disorder, chronic: Secondary | ICD-10-CM | POA: Diagnosis not present

## 2021-05-15 DIAGNOSIS — H16223 Keratoconjunctivitis sicca, not specified as Sjogren's, bilateral: Secondary | ICD-10-CM | POA: Diagnosis not present

## 2021-05-15 DIAGNOSIS — H43811 Vitreous degeneration, right eye: Secondary | ICD-10-CM | POA: Diagnosis not present

## 2021-05-15 DIAGNOSIS — H1013 Acute atopic conjunctivitis, bilateral: Secondary | ICD-10-CM | POA: Diagnosis not present

## 2021-05-15 DIAGNOSIS — H2513 Age-related nuclear cataract, bilateral: Secondary | ICD-10-CM | POA: Diagnosis not present

## 2021-05-22 DIAGNOSIS — L661 Lichen planopilaris: Secondary | ICD-10-CM | POA: Diagnosis not present

## 2021-05-25 DIAGNOSIS — F4312 Post-traumatic stress disorder, chronic: Secondary | ICD-10-CM | POA: Diagnosis not present

## 2021-05-29 DIAGNOSIS — B351 Tinea unguium: Secondary | ICD-10-CM | POA: Diagnosis not present

## 2021-05-29 DIAGNOSIS — M79671 Pain in right foot: Secondary | ICD-10-CM | POA: Diagnosis not present

## 2021-05-29 DIAGNOSIS — M79675 Pain in left toe(s): Secondary | ICD-10-CM | POA: Diagnosis not present

## 2021-05-29 DIAGNOSIS — L600 Ingrowing nail: Secondary | ICD-10-CM | POA: Diagnosis not present

## 2021-06-08 DIAGNOSIS — F4312 Post-traumatic stress disorder, chronic: Secondary | ICD-10-CM | POA: Diagnosis not present

## 2021-06-22 DIAGNOSIS — F4312 Post-traumatic stress disorder, chronic: Secondary | ICD-10-CM | POA: Diagnosis not present

## 2021-06-26 DIAGNOSIS — L600 Ingrowing nail: Secondary | ICD-10-CM | POA: Diagnosis not present

## 2021-06-26 DIAGNOSIS — M79675 Pain in left toe(s): Secondary | ICD-10-CM | POA: Diagnosis not present

## 2021-06-26 DIAGNOSIS — M79674 Pain in right toe(s): Secondary | ICD-10-CM | POA: Diagnosis not present

## 2021-06-26 DIAGNOSIS — B351 Tinea unguium: Secondary | ICD-10-CM | POA: Diagnosis not present

## 2021-06-27 DIAGNOSIS — F4312 Post-traumatic stress disorder, chronic: Secondary | ICD-10-CM | POA: Diagnosis not present

## 2021-07-03 DIAGNOSIS — G894 Chronic pain syndrome: Secondary | ICD-10-CM | POA: Diagnosis not present

## 2021-07-03 DIAGNOSIS — M47816 Spondylosis without myelopathy or radiculopathy, lumbar region: Secondary | ICD-10-CM | POA: Diagnosis not present

## 2021-07-03 DIAGNOSIS — M7061 Trochanteric bursitis, right hip: Secondary | ICD-10-CM | POA: Diagnosis not present

## 2021-07-03 DIAGNOSIS — M7062 Trochanteric bursitis, left hip: Secondary | ICD-10-CM | POA: Diagnosis not present

## 2021-07-21 DIAGNOSIS — F4312 Post-traumatic stress disorder, chronic: Secondary | ICD-10-CM | POA: Diagnosis not present

## 2021-07-24 DIAGNOSIS — L600 Ingrowing nail: Secondary | ICD-10-CM | POA: Diagnosis not present

## 2021-07-24 DIAGNOSIS — M79674 Pain in right toe(s): Secondary | ICD-10-CM | POA: Diagnosis not present

## 2021-07-24 DIAGNOSIS — B351 Tinea unguium: Secondary | ICD-10-CM | POA: Diagnosis not present

## 2021-07-24 DIAGNOSIS — M79675 Pain in left toe(s): Secondary | ICD-10-CM | POA: Diagnosis not present

## 2021-07-27 DIAGNOSIS — F4312 Post-traumatic stress disorder, chronic: Secondary | ICD-10-CM | POA: Diagnosis not present

## 2021-08-04 DIAGNOSIS — F4312 Post-traumatic stress disorder, chronic: Secondary | ICD-10-CM | POA: Diagnosis not present

## 2021-08-07 DIAGNOSIS — F4312 Post-traumatic stress disorder, chronic: Secondary | ICD-10-CM | POA: Diagnosis not present

## 2021-08-07 DIAGNOSIS — F341 Dysthymic disorder: Secondary | ICD-10-CM | POA: Diagnosis not present

## 2021-08-10 DIAGNOSIS — F4312 Post-traumatic stress disorder, chronic: Secondary | ICD-10-CM | POA: Diagnosis not present

## 2021-08-17 DIAGNOSIS — F4312 Post-traumatic stress disorder, chronic: Secondary | ICD-10-CM | POA: Diagnosis not present

## 2021-08-24 DIAGNOSIS — F4312 Post-traumatic stress disorder, chronic: Secondary | ICD-10-CM | POA: Diagnosis not present

## 2021-08-29 DIAGNOSIS — F341 Dysthymic disorder: Secondary | ICD-10-CM | POA: Diagnosis not present

## 2021-08-29 DIAGNOSIS — F4312 Post-traumatic stress disorder, chronic: Secondary | ICD-10-CM | POA: Diagnosis not present

## 2021-08-30 DIAGNOSIS — M79671 Pain in right foot: Secondary | ICD-10-CM | POA: Diagnosis not present

## 2021-08-30 DIAGNOSIS — L600 Ingrowing nail: Secondary | ICD-10-CM | POA: Diagnosis not present

## 2021-08-30 DIAGNOSIS — M79672 Pain in left foot: Secondary | ICD-10-CM | POA: Diagnosis not present

## 2021-08-30 DIAGNOSIS — B351 Tinea unguium: Secondary | ICD-10-CM | POA: Diagnosis not present

## 2021-08-31 DIAGNOSIS — F4312 Post-traumatic stress disorder, chronic: Secondary | ICD-10-CM | POA: Diagnosis not present

## 2021-09-01 DIAGNOSIS — M47816 Spondylosis without myelopathy or radiculopathy, lumbar region: Secondary | ICD-10-CM | POA: Diagnosis not present

## 2021-09-01 DIAGNOSIS — M7061 Trochanteric bursitis, right hip: Secondary | ICD-10-CM | POA: Diagnosis not present

## 2021-09-01 DIAGNOSIS — M7062 Trochanteric bursitis, left hip: Secondary | ICD-10-CM | POA: Diagnosis not present

## 2021-09-01 DIAGNOSIS — G894 Chronic pain syndrome: Secondary | ICD-10-CM | POA: Diagnosis not present

## 2021-09-07 DIAGNOSIS — F4312 Post-traumatic stress disorder, chronic: Secondary | ICD-10-CM | POA: Diagnosis not present

## 2021-09-14 DIAGNOSIS — F4312 Post-traumatic stress disorder, chronic: Secondary | ICD-10-CM | POA: Diagnosis not present

## 2021-09-21 DIAGNOSIS — F4312 Post-traumatic stress disorder, chronic: Secondary | ICD-10-CM | POA: Diagnosis not present

## 2021-09-26 DIAGNOSIS — F4312 Post-traumatic stress disorder, chronic: Secondary | ICD-10-CM | POA: Diagnosis not present

## 2021-09-26 DIAGNOSIS — F341 Dysthymic disorder: Secondary | ICD-10-CM | POA: Diagnosis not present

## 2021-10-02 DIAGNOSIS — R5383 Other fatigue: Secondary | ICD-10-CM | POA: Diagnosis not present

## 2021-10-02 DIAGNOSIS — J449 Chronic obstructive pulmonary disease, unspecified: Secondary | ICD-10-CM | POA: Diagnosis not present

## 2021-10-02 DIAGNOSIS — R0981 Nasal congestion: Secondary | ICD-10-CM | POA: Diagnosis not present

## 2021-10-02 DIAGNOSIS — B349 Viral infection, unspecified: Secondary | ICD-10-CM | POA: Diagnosis not present

## 2021-10-02 DIAGNOSIS — Z03818 Encounter for observation for suspected exposure to other biological agents ruled out: Secondary | ICD-10-CM | POA: Diagnosis not present

## 2021-10-02 DIAGNOSIS — R051 Acute cough: Secondary | ICD-10-CM | POA: Diagnosis not present

## 2021-10-05 DIAGNOSIS — F4312 Post-traumatic stress disorder, chronic: Secondary | ICD-10-CM | POA: Diagnosis not present

## 2021-10-12 DIAGNOSIS — F4312 Post-traumatic stress disorder, chronic: Secondary | ICD-10-CM | POA: Diagnosis not present

## 2021-10-18 DIAGNOSIS — F4312 Post-traumatic stress disorder, chronic: Secondary | ICD-10-CM | POA: Diagnosis not present

## 2021-10-31 DIAGNOSIS — M7062 Trochanteric bursitis, left hip: Secondary | ICD-10-CM | POA: Diagnosis not present

## 2021-10-31 DIAGNOSIS — M47816 Spondylosis without myelopathy or radiculopathy, lumbar region: Secondary | ICD-10-CM | POA: Diagnosis not present

## 2021-10-31 DIAGNOSIS — M7061 Trochanteric bursitis, right hip: Secondary | ICD-10-CM | POA: Diagnosis not present

## 2021-10-31 DIAGNOSIS — G894 Chronic pain syndrome: Secondary | ICD-10-CM | POA: Diagnosis not present

## 2021-11-02 DIAGNOSIS — F4312 Post-traumatic stress disorder, chronic: Secondary | ICD-10-CM | POA: Diagnosis not present

## 2021-11-16 DIAGNOSIS — F4312 Post-traumatic stress disorder, chronic: Secondary | ICD-10-CM | POA: Diagnosis not present

## 2021-11-23 DIAGNOSIS — F4312 Post-traumatic stress disorder, chronic: Secondary | ICD-10-CM | POA: Diagnosis not present
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
## 2021-12-18 DIAGNOSIS — I10 Essential (primary) hypertension: Secondary | ICD-10-CM | POA: Diagnosis not present

## 2021-12-19 DIAGNOSIS — F4312 Post-traumatic stress disorder, chronic: Secondary | ICD-10-CM | POA: Diagnosis not present

## 2021-12-19 DIAGNOSIS — F341 Dysthymic disorder: Secondary | ICD-10-CM | POA: Diagnosis not present

## 2021-12-25 DIAGNOSIS — Z1231 Encounter for screening mammogram for malignant neoplasm of breast: Secondary | ICD-10-CM | POA: Diagnosis not present

## 2021-12-29 DIAGNOSIS — Z0000 Encounter for general adult medical examination without abnormal findings: Secondary | ICD-10-CM | POA: Diagnosis not present

## 2021-12-29 DIAGNOSIS — J9691 Respiratory failure, unspecified with hypoxia: Secondary | ICD-10-CM | POA: Diagnosis not present

## 2021-12-29 DIAGNOSIS — F419 Anxiety disorder, unspecified: Secondary | ICD-10-CM | POA: Diagnosis not present

## 2021-12-29 DIAGNOSIS — L659 Nonscarring hair loss, unspecified: Secondary | ICD-10-CM | POA: Diagnosis not present

## 2021-12-29 DIAGNOSIS — I700 Atherosclerosis of aorta: Secondary | ICD-10-CM | POA: Diagnosis not present

## 2021-12-29 DIAGNOSIS — E042 Nontoxic multinodular goiter: Secondary | ICD-10-CM | POA: Diagnosis not present

## 2021-12-29 DIAGNOSIS — E7800 Pure hypercholesterolemia, unspecified: Secondary | ICD-10-CM | POA: Diagnosis not present

## 2021-12-29 DIAGNOSIS — I10 Essential (primary) hypertension: Secondary | ICD-10-CM | POA: Diagnosis not present

## 2021-12-29 DIAGNOSIS — Z9981 Dependence on supplemental oxygen: Secondary | ICD-10-CM | POA: Diagnosis not present

## 2021-12-29 DIAGNOSIS — F329 Major depressive disorder, single episode, unspecified: Secondary | ICD-10-CM | POA: Diagnosis not present

## 2021-12-29 DIAGNOSIS — J449 Chronic obstructive pulmonary disease, unspecified: Secondary | ICD-10-CM | POA: Diagnosis not present

## 2021-12-29 DIAGNOSIS — M5136 Other intervertebral disc degeneration, lumbar region: Secondary | ICD-10-CM | POA: Diagnosis not present

## 2022-01-02 DIAGNOSIS — Z0000 Encounter for general adult medical examination without abnormal findings: Secondary | ICD-10-CM | POA: Diagnosis not present

## 2022-01-02 DIAGNOSIS — E042 Nontoxic multinodular goiter: Secondary | ICD-10-CM | POA: Diagnosis not present

## 2022-01-02 DIAGNOSIS — Z1211 Encounter for screening for malignant neoplasm of colon: Secondary | ICD-10-CM | POA: Diagnosis not present

## 2022-01-02 DIAGNOSIS — I10 Essential (primary) hypertension: Secondary | ICD-10-CM | POA: Diagnosis not present

## 2022-01-02 DIAGNOSIS — E7800 Pure hypercholesterolemia, unspecified: Secondary | ICD-10-CM | POA: Diagnosis not present

## 2022-01-25 DIAGNOSIS — J329 Chronic sinusitis, unspecified: Secondary | ICD-10-CM | POA: Diagnosis not present

## 2022-03-13 DIAGNOSIS — R0602 Shortness of breath: Secondary | ICD-10-CM | POA: Diagnosis not present

## 2022-03-13 DIAGNOSIS — J309 Allergic rhinitis, unspecified: Secondary | ICD-10-CM | POA: Diagnosis not present

## 2022-04-11 IMAGING — CR DG CHEST 2V
2 series · 2 of 2 positions shown · non-contrast
Comparison: 12/26/2018

CLINICAL DATA: Preop history of COPD and hypertension

EXAM:
CHEST - 2 VIEW

[w chest pa]
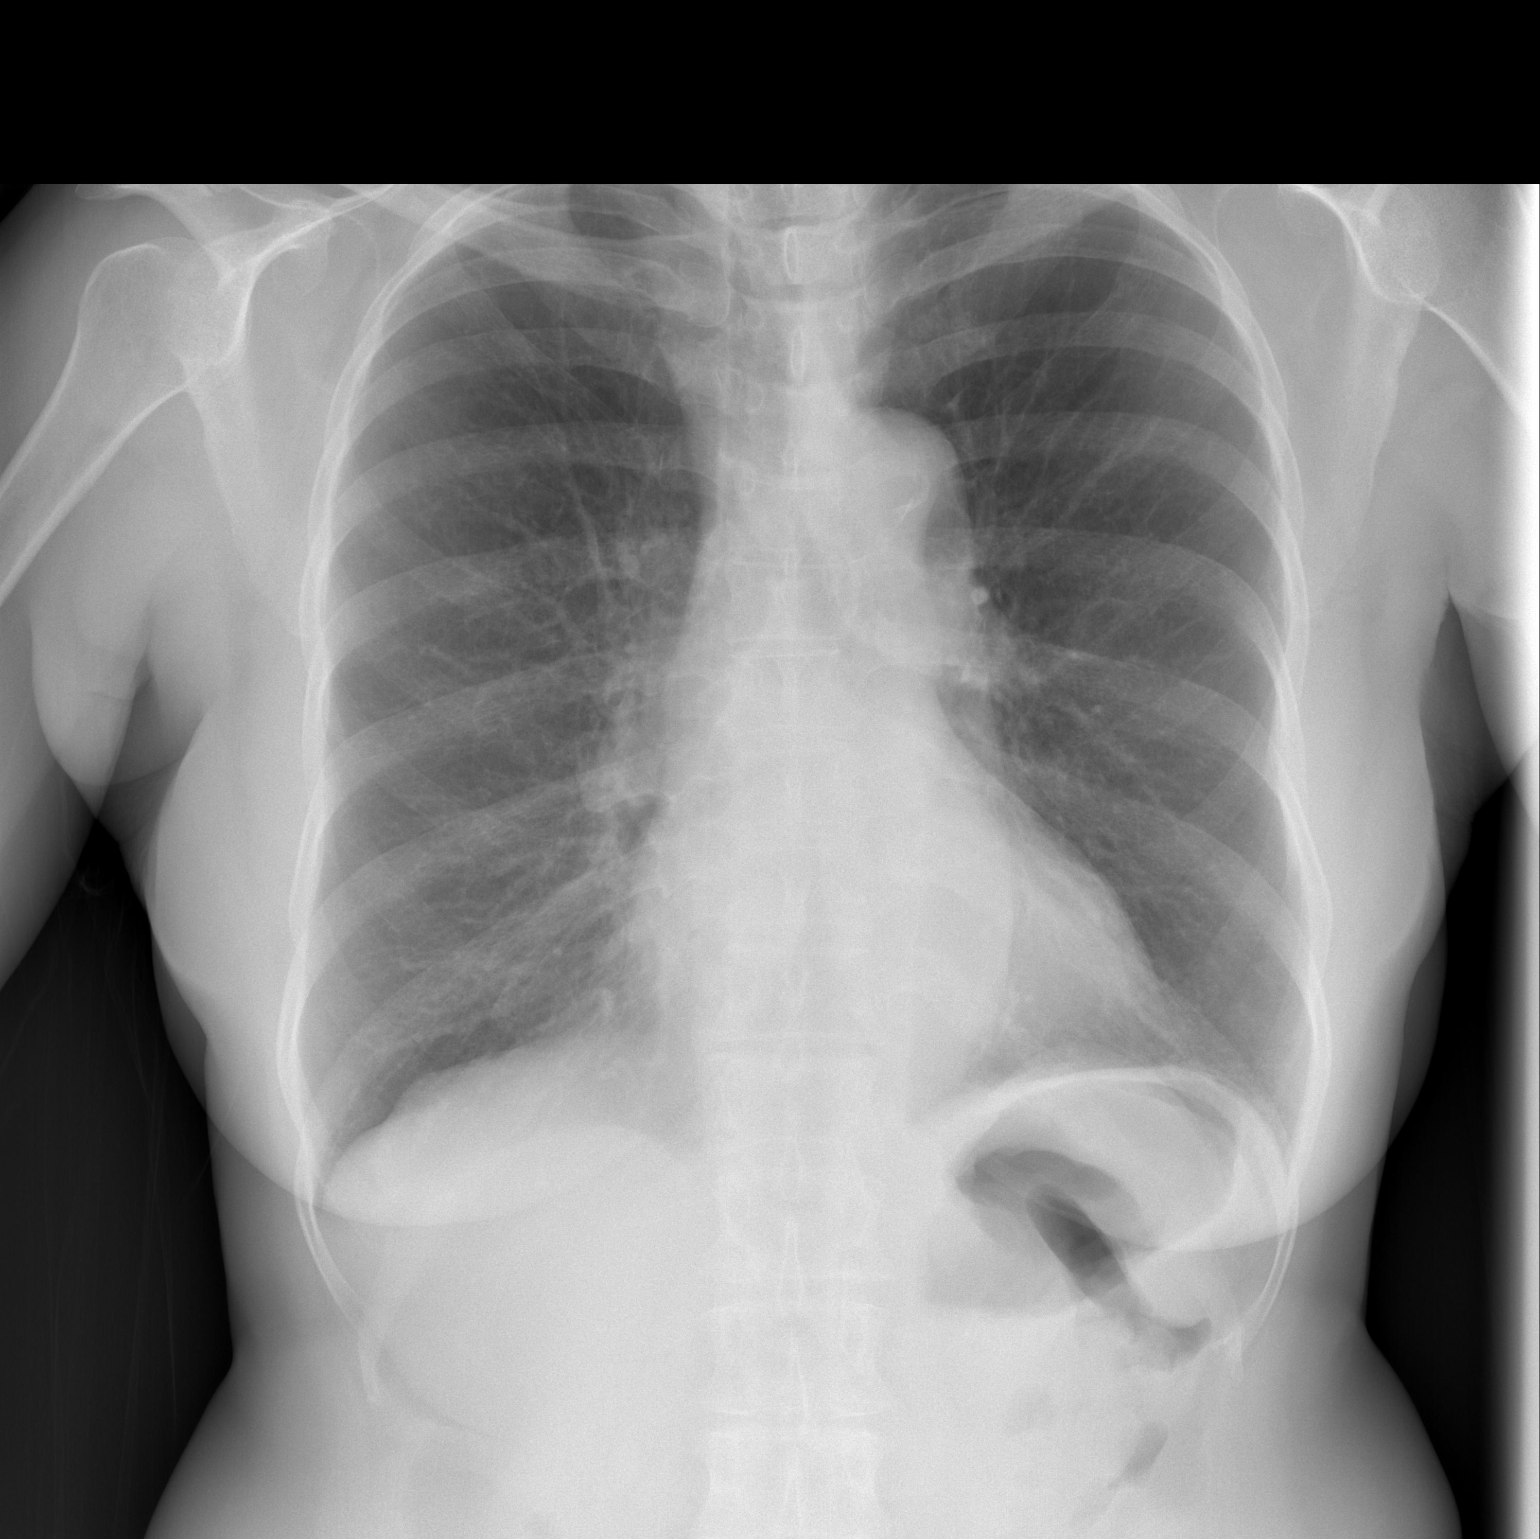

[w chest lat]
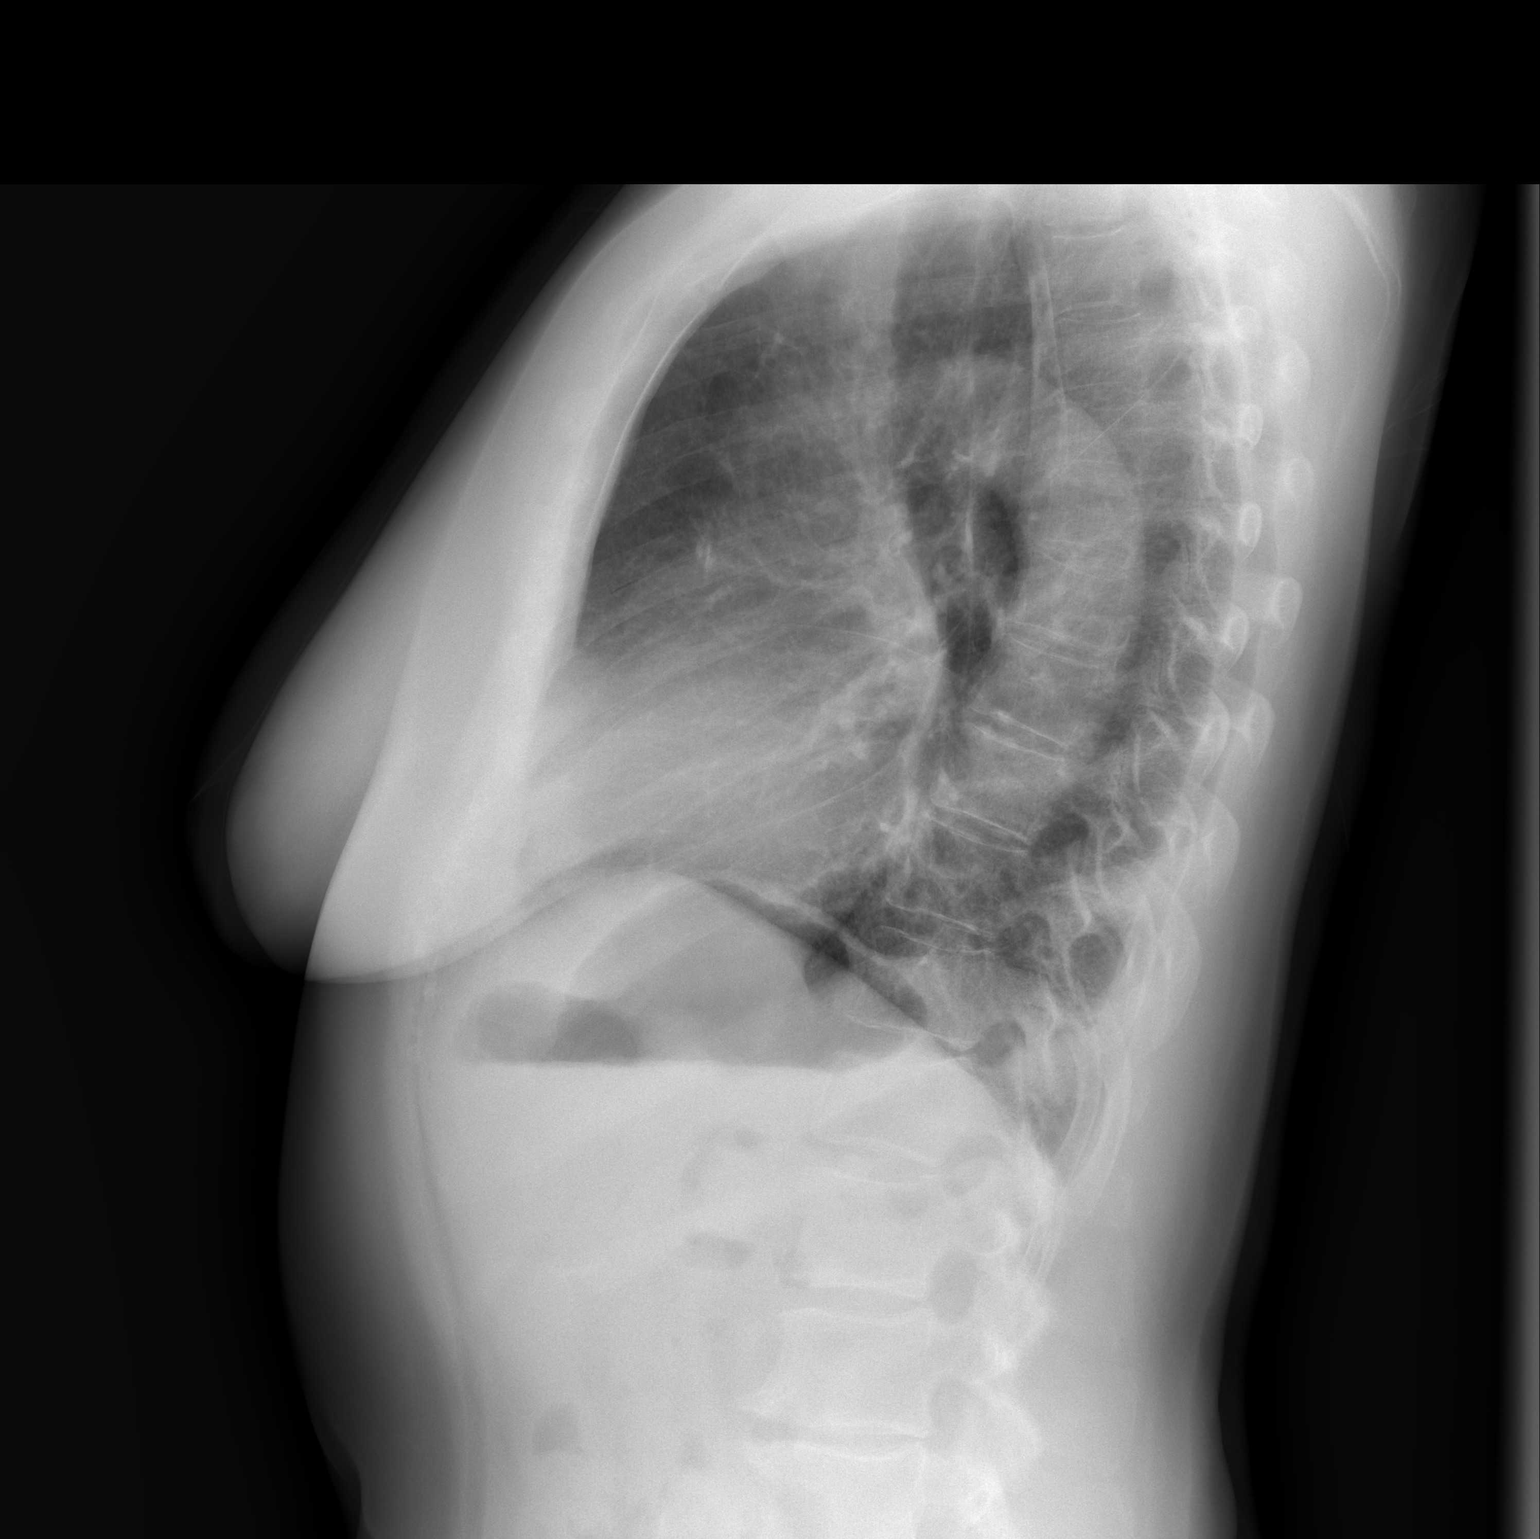

[2 of 2 positions shown; findings below may reference images not displayed]

FINDINGS: The heart size and mediastinal contours are within normal limits.
Mild aortic atherosclerosis. Both lungs are clear. The visualized
skeletal structures are unremarkable.
IMPRESSION: No active cardiopulmonary disease.

## 2022-05-01 DIAGNOSIS — F4312 Post-traumatic stress disorder, chronic: Secondary | ICD-10-CM | POA: Diagnosis not present

## 2022-05-01 DIAGNOSIS — F341 Dysthymic disorder: Secondary | ICD-10-CM | POA: Diagnosis not present

## 2022-05-18 ENCOUNTER — Telehealth: Payer: Self-pay | Admitting: Pulmonary Disease

## 2022-05-18 DIAGNOSIS — R600 Localized edema: Secondary | ICD-10-CM | POA: Diagnosis not present

## 2022-05-18 NOTE — Telephone Encounter (Signed)
Pt called in with concerns for swollen legs and hands..wants to rule out clots . Would like for the Nurse to give a call back at [phone].

## 2022-05-18 NOTE — Telephone Encounter (Signed)
Spoke with pt who states that she has noticed swelling in both feet and also in the right ankle. Pt states she think is may be related to eating pork products a couple days a go. Pt denies pain and redness in swollen areas. Pt denies any recant trama known to those areas. Pt denies history of blood clots. Pt did soak feet in Epsion salt which did reduce swelling some what. Pt was instructed to reach out to PCP as well to notify them of situation. Pt is concerned about blood clots. Pt denies any increased SOB. Pamela Roth please advise as Dr. Francine Graven is unavailable.

## 2022-05-18 NOTE — Telephone Encounter (Signed)
Pamela Ngo, NP  [phone] minutes ago (11:48 AM)    This is most likely related to the increased salt intake. Have her call her PCP office. If it gets worse over the weekend, she needs to seek emergency treatment . No shortness of breath, correct? No pain behind the knee.  Spoke with pt who is seeing her PCP at 2:30 pm. Nothing further needed at this time.

## 2022-05-21 DIAGNOSIS — H43811 Vitreous degeneration, right eye: Secondary | ICD-10-CM | POA: Diagnosis not present

## 2022-05-21 DIAGNOSIS — H1013 Acute atopic conjunctivitis, bilateral: Secondary | ICD-10-CM | POA: Diagnosis not present

## 2022-05-21 DIAGNOSIS — H2513 Age-related nuclear cataract, bilateral: Secondary | ICD-10-CM | POA: Diagnosis not present

## 2022-06-06 DIAGNOSIS — L661 Lichen planopilaris: Secondary | ICD-10-CM | POA: Diagnosis not present

## 2022-06-14 DIAGNOSIS — M25562 Pain in left knee: Secondary | ICD-10-CM | POA: Diagnosis not present

## 2022-06-14 DIAGNOSIS — M170 Bilateral primary osteoarthritis of knee: Secondary | ICD-10-CM | POA: Diagnosis not present

## 2022-06-14 DIAGNOSIS — M25561 Pain in right knee: Secondary | ICD-10-CM | POA: Diagnosis not present

## 2022-06-14 DIAGNOSIS — R262 Difficulty in walking, not elsewhere classified: Secondary | ICD-10-CM | POA: Diagnosis not present

## 2022-06-19 ENCOUNTER — Ambulatory Visit (HOSPITAL_COMMUNITY)
Admission: RE | Admit: 2022-06-19 | Discharge: 2022-06-19 | Disposition: A | Discharge status: Home / Self Care | Payer: Medicare Other | Source: Ambulatory Visit | Attending: Internal Medicine | Admitting: Internal Medicine

## 2022-06-19 ENCOUNTER — Encounter: Payer: Self-pay | Admitting: Internal Medicine

## 2022-06-19 ENCOUNTER — Other Ambulatory Visit (HOSPITAL_COMMUNITY): Payer: Self-pay | Admitting: Internal Medicine

## 2022-06-19 DIAGNOSIS — J9611 Chronic respiratory failure with hypoxia: Secondary | ICD-10-CM | POA: Diagnosis not present

## 2022-06-19 DIAGNOSIS — Z9981 Dependence on supplemental oxygen: Secondary | ICD-10-CM | POA: Diagnosis not present

## 2022-06-19 DIAGNOSIS — R600 Localized edema: Secondary | ICD-10-CM

## 2022-06-19 DIAGNOSIS — M170 Bilateral primary osteoarthritis of knee: Secondary | ICD-10-CM | POA: Diagnosis not present

## 2022-06-19 DIAGNOSIS — R202 Paresthesia of skin: Secondary | ICD-10-CM | POA: Diagnosis not present

## 2022-06-20 ENCOUNTER — Encounter (HOSPITAL_COMMUNITY): Payer: Medicare Other

## 2022-06-21 DIAGNOSIS — R262 Difficulty in walking, not elsewhere classified: Secondary | ICD-10-CM | POA: Diagnosis not present

## 2022-06-21 DIAGNOSIS — M25561 Pain in right knee: Secondary | ICD-10-CM | POA: Diagnosis not present

## 2022-06-21 DIAGNOSIS — M170 Bilateral primary osteoarthritis of knee: Secondary | ICD-10-CM | POA: Diagnosis not present

## 2022-06-21 DIAGNOSIS — M25562 Pain in left knee: Secondary | ICD-10-CM | POA: Diagnosis not present

## 2022-06-29 DIAGNOSIS — E7800 Pure hypercholesterolemia, unspecified: Secondary | ICD-10-CM | POA: Diagnosis not present

## 2022-06-29 DIAGNOSIS — Z9981 Dependence on supplemental oxygen: Secondary | ICD-10-CM | POA: Diagnosis not present

## 2022-06-29 DIAGNOSIS — M25561 Pain in right knee: Secondary | ICD-10-CM | POA: Diagnosis not present

## 2022-06-29 DIAGNOSIS — J9691 Respiratory failure, unspecified with hypoxia: Secondary | ICD-10-CM | POA: Diagnosis not present

## 2022-06-29 DIAGNOSIS — J309 Allergic rhinitis, unspecified: Secondary | ICD-10-CM | POA: Diagnosis not present

## 2022-06-29 DIAGNOSIS — R600 Localized edema: Secondary | ICD-10-CM | POA: Diagnosis not present

## 2022-06-29 DIAGNOSIS — M170 Bilateral primary osteoarthritis of knee: Secondary | ICD-10-CM | POA: Diagnosis not present

## 2022-06-29 DIAGNOSIS — J449 Chronic obstructive pulmonary disease, unspecified: Secondary | ICD-10-CM | POA: Diagnosis not present

## 2022-06-29 DIAGNOSIS — M25562 Pain in left knee: Secondary | ICD-10-CM | POA: Diagnosis not present

## 2022-06-29 DIAGNOSIS — I700 Atherosclerosis of aorta: Secondary | ICD-10-CM | POA: Diagnosis not present

## 2022-06-29 DIAGNOSIS — I10 Essential (primary) hypertension: Secondary | ICD-10-CM | POA: Diagnosis not present

## 2022-06-29 DIAGNOSIS — R262 Difficulty in walking, not elsewhere classified: Secondary | ICD-10-CM | POA: Diagnosis not present

## 2022-07-06 DIAGNOSIS — R262 Difficulty in walking, not elsewhere classified: Secondary | ICD-10-CM | POA: Diagnosis not present

## 2022-07-06 DIAGNOSIS — M170 Bilateral primary osteoarthritis of knee: Secondary | ICD-10-CM | POA: Diagnosis not present

## 2022-07-06 DIAGNOSIS — M25562 Pain in left knee: Secondary | ICD-10-CM | POA: Diagnosis not present

## 2022-07-06 DIAGNOSIS — M25561 Pain in right knee: Secondary | ICD-10-CM | POA: Diagnosis not present

## 2022-07-20 DIAGNOSIS — R262 Difficulty in walking, not elsewhere classified: Secondary | ICD-10-CM | POA: Diagnosis not present

## 2022-07-20 DIAGNOSIS — M170 Bilateral primary osteoarthritis of knee: Secondary | ICD-10-CM | POA: Diagnosis not present

## 2022-07-20 DIAGNOSIS — M25561 Pain in right knee: Secondary | ICD-10-CM | POA: Diagnosis not present

## 2022-07-20 DIAGNOSIS — M25562 Pain in left knee: Secondary | ICD-10-CM | POA: Diagnosis not present

## 2022-07-27 DIAGNOSIS — M25561 Pain in right knee: Secondary | ICD-10-CM | POA: Diagnosis not present

## 2022-07-27 DIAGNOSIS — R262 Difficulty in walking, not elsewhere classified: Secondary | ICD-10-CM | POA: Diagnosis not present

## 2022-07-27 DIAGNOSIS — M170 Bilateral primary osteoarthritis of knee: Secondary | ICD-10-CM | POA: Diagnosis not present

## 2022-07-27 DIAGNOSIS — M25562 Pain in left knee: Secondary | ICD-10-CM | POA: Diagnosis not present

## 2022-08-03 DIAGNOSIS — M25562 Pain in left knee: Secondary | ICD-10-CM | POA: Diagnosis not present

## 2022-08-03 DIAGNOSIS — M25561 Pain in right knee: Secondary | ICD-10-CM | POA: Diagnosis not present

## 2022-08-03 DIAGNOSIS — M170 Bilateral primary osteoarthritis of knee: Secondary | ICD-10-CM | POA: Diagnosis not present

## 2022-08-03 DIAGNOSIS — R262 Difficulty in walking, not elsewhere classified: Secondary | ICD-10-CM | POA: Diagnosis not present

## 2022-09-12 DIAGNOSIS — R262 Difficulty in walking, not elsewhere classified: Secondary | ICD-10-CM | POA: Diagnosis not present

## 2022-09-12 DIAGNOSIS — M170 Bilateral primary osteoarthritis of knee: Secondary | ICD-10-CM | POA: Diagnosis not present

## 2022-09-12 DIAGNOSIS — M25561 Pain in right knee: Secondary | ICD-10-CM | POA: Diagnosis not present

## 2022-09-12 DIAGNOSIS — M25562 Pain in left knee: Secondary | ICD-10-CM | POA: Diagnosis not present

## 2022-10-09 ENCOUNTER — Telehealth: Payer: Self-pay | Admitting: Gastroenterology

## 2022-10-09 NOTE — Telephone Encounter (Addendum)
Patient last had colonoscopy with Dr. Marca Ancona, wants a female provider, will have records sent.,

## 2022-10-25 NOTE — Telephone Encounter (Addendum)
Patient last had in 2011. Does not need Transfer of Care, called patient to schedule colonoscopy.

## 2022-11-05 NOTE — Progress Notes (Unsigned)
11/06/2022 Pamela Roth [id number] 02/01/1950  Referring provider: No ref. provider found Primary GI doctor: Dr. Lavon Paganini  ASSESSMENT AND PLAN:  Fecal Incontinence Increased frequency and volume of loose stools, worsening over the past few months. No rectal pain or prolapse. Rectal exam revealed poor anal tone and presence of soft stool. Causes of fecal incontinence include anal sphincter weakness, decreased rectal sensation, decreased rectal compliance, overflow, and idiopathic fecal incontinence. -Check Cdiff with history of ABX use and worsening incontinence.  -Psyllium/Benfiber to add stool bulk -Loperamide to treat the diarrhea.  -Colonoscopy to evaluate further at the hospital due to oxygen -We have discussed the risks of bleeding, infection, perforation, medication reactions, and remote risk of death associated with colonoscopy. All questions were answered and the patient acknowledges these risk and wishes to proceed. -Will consider anorectal manometry +/- endoscopic ultrasound +/- pelvic floor MRI if poor response to medical therapy  Chronic Obstructive Pulmonary Disease (COPD) On as-needed oxygen, mainly for walking. Reports occasional oxygen saturation below 88%. -Advise to keep oxygen saturation above 88% -Recommend discussing increased oxygen use with pulmonary doctor.  Hypertension and Hyperlipidemia On Losartan, Amlodipine, and Simvastatin. -Continue current medications.  Depression/Anxiety On Trazodone and Cymbalta. -Continue current medications.   Patient Care Team: Ollen Bowl, MD as Consulting Physician (Internal Medicine)  HISTORY OF PRESENT ILLNESS: 72 y.o. female with a past medical history of HTN, hyperlipidemia, aortic atherosclerosis, COPD with history of respiratory failure dependent on oxygen, diverticulosis and others listed below presents for evaluation of fecal incontinence.   Patient previously saw Dr Marca Ancona with [facility]. 10/13/2009  colonoscopy Dr. Bosie Clos showed hyperplastic polyp, benign lymphoid polyp, hyperplastic polyp benign lymphoid polyp recall 10 years  Discussed the use of AI scribe software for clinical note transcription with the patient, who gave verbal consent to proceed.  The patient, with a history of oxygen dependence, presented with a chief complaint of worsening fecal incontinence. The patient reported a diagnosis of fecal incontinence several years ago, which had initially improved but has been progressively worsening over the past few months. The incontinence is described as large volume, loose stools occurring multiple times daily, sometimes with and sometimes without the sensation of needing to have a bowel movement. The patient reported no nocturnal episodes, and no recent changes in medication that could account for the worsening symptoms.  The patient also reported starting a prebiotic fiber supplement a month ago, but noted no improvement in symptoms. The patient has a history of one vaginal childbirth, with no complications reported. The patient reported an increase in bowel movements, now occurring three to four times daily, all of which are loose. There were no associated symptoms of abdominal pain, nausea, vomiting, unintentional weight loss, fevers, or chills. The patient may have had antibiotic in last 6 months for URI.   The patient also reported a history of oxygen dependence, using it as needed, particularly during walking. The patient denied any current use of blood thinners. The patient reported no history of rectal surgery, urinary incontinence, or numbness/tingling in the legs.  The patient's last colonoscopy was in 2011, and she has been using fecal occult blood test cards for colorectal cancer screening in recent years. The patient reported no changes in diet or new medications that could account for the worsening fecal incontinence. The patient expressed concern about the impact of the  incontinence on her quality of life and was eager to seek treatment.    She  reports that she quit smoking  about 14 years ago. Her smoking use included cigarettes. She has never used smokeless tobacco. She reports that she does not drink alcohol and does not use drugs.  RELEVANT LABS AND IMAGING:  Results   LABS Fecal Occult Blood Test: Negative (11/05/2022)  DIAGNOSTIC Rectal Exam: Loose stool, poor rectal tone, no masses, no hemorrhoids (11/05/2022)      CBC    Component Value Date/Time   WBC 8.8 11/02/2019 0513   RBC 4.52 11/02/2019 0513   HGB 13.7 11/02/2019 0513   HCT 40.1 11/02/2019 0513   PLT 229 11/02/2019 0513   MCV 88.7 11/02/2019 0513   MCH 30.3 11/02/2019 0513   MCHC 34.2 11/02/2019 0513   RDW 15.4 11/02/2019 0513   LYMPHSABS 1.0 11/02/2019 0513   MONOABS 0.3 11/02/2019 0513   EOSABS 0.0 11/02/2019 0513   BASOSABS 0.0 11/02/2019 0513   No results for input(s): "HGB" in the last 8760 hours.  CMP     Component Value Date/Time   NA 137 11/02/2019 0513   K 4.3 11/02/2019 0513   CL 106 11/02/2019 0513   CO2 24 11/02/2019 0513   GLUCOSE 163 (H) 11/02/2019 0513   BUN 24 (H) 11/02/2019 0513   CREATININE 0.91 11/02/2019 0513   CALCIUM 8.9 11/02/2019 0513   PROT 6.0 (L) 11/02/2019 0513   ALBUMIN 2.6 (L) 11/02/2019 0513   AST 20 11/02/2019 0513   ALT 23 11/02/2019 0513   ALKPHOS 38 11/02/2019 0513   BILITOT 0.4 11/02/2019 0513   GFRNONAA >60 11/02/2019 0513   GFRAA >60 05/05/2019 0244      Latest Ref Rng & Units 11/02/2019    5:13 AM 11/01/2019    3:55 AM 10/31/2019    3:01 AM  Hepatic Function  Total Protein 6.5 - 8.1 g/dL 6.0  6.1  6.1   Albumin 3.5 - 5.0 g/dL 2.6  2.9  2.8   AST 15 - 41 U/L 20  28  34   ALT 0 - 44 U/L 23  26  24    Alk Phosphatase 38 - 126 U/L 38  45  39   Total Bilirubin 0.3 - 1.2 mg/dL 0.4  0.7  0.3       Current Medications:    Current Outpatient Medications (Cardiovascular):    amLODipine (NORVASC) 5 MG tablet, Take 5 mg  by mouth daily.   losartan (COZAAR) 50 MG tablet, Take 50 mg by mouth daily.   simvastatin (ZOCOR) 20 MG tablet, Take 20 mg by mouth daily.   Current Outpatient Medications (Respiratory):    fluticasone (FLONASE) 50 MCG/ACT nasal spray, Place 2 sprays into both nostrils as needed.   SYMBICORT 80-4.5 MCG/ACT inhaler,     Current Outpatient Medications (Other):    diclofenac Sodium (VOLTAREN) 1 % GEL, Apply topically.   DULoxetine (CYMBALTA) 30 MG capsule, Take 30 mg by mouth daily.   Fluocinolone Acetonide Scalp 0.01 % OIL, Apply 1 Application topically as needed.   Multiple Vitamins-Minerals (ONE-A-DAY PROACTIVE 65+) TABS, Take 1 tablet by mouth daily with breakfast.   OXYGEN, Inhale 2-6 L/min into the lungs as needed.   traZODone (DESYREL) 100 MG tablet, Take 100 mg by mouth at bedtime as needed.  Medical History:  Past Medical History:  Diagnosis Date   Anxiety    Arthritis    Benign lymphoid polyp of colon    COPD (chronic obstructive pulmonary disease) (HCC)    Depression    Diverticulosis    Hyperlipidemia    Hyperplastic polyp of  sigmoid colon    Hypertension    Multinodular goiter    Allergies: No Known Allergies   Surgical History:  She  has a past surgical history that includes Partial hysterectomy (1980s); Total hip arthroplasty (Right, 05/04/2019); and Colonoscopy. Family History:  Her family history includes Asthma in her daughter; Dementia in her mother; Hypertension in her mother; Schizophrenia in her mother; Thyroid cancer in her daughter.  REVIEW OF SYSTEMS  : All other systems reviewed and negative except where noted in the History of Present Illness.  PHYSICAL EXAM: BP 122/64   Pulse 62   Ht 5\' 5"  (1.651 m)   Wt 174 lb 2 oz (79 kg)   BMI 28.98 kg/m  General Appearance: Well appearing female, in no apparent distress. Head:   Normocephalic and atraumatic. Eyes:  sclerae anicteric,conjunctive pink  Respiratory: Respiratory effort normal, BS equal  bilaterally without rales, rhonchi, wheezing. Cardio: RRR with no MRGs. Peripheral pulses intact.  Abdomen: Soft,  Obese ,active bowel sounds. No tenderness . Without guarding and Without rebound. No masses. Rectal: Normal external rectal exam, decreased rectal tone, no internal hemorrhoids appreciated, no masses, non tender, brown stool, hemoccult Negative Musculoskeletal: Full ROM, Normal gait. Without edema. Skin:  Dry and intact without significant lesions or rashes Neuro: Alert and  oriented x4;  No focal deficits. Psych:  Cooperative. Normal mood and affect.    Doree Albee, PA-C 10:48 AM

## 2022-11-06 ENCOUNTER — Encounter: Payer: Self-pay | Admitting: Physician Assistant

## 2022-11-06 ENCOUNTER — Ambulatory Visit (INDEPENDENT_AMBULATORY_CARE_PROVIDER_SITE_OTHER): Payer: Medicare Other | Admitting: Physician Assistant

## 2022-11-06 VITALS — BP 122/64 | HR 62 | Ht 65.0 in | Wt 174.1 lb

## 2022-11-06 DIAGNOSIS — R159 Full incontinence of feces: Secondary | ICD-10-CM | POA: Diagnosis not present

## 2022-11-06 DIAGNOSIS — A09 Infectious gastroenteritis and colitis, unspecified: Secondary | ICD-10-CM

## 2022-11-06 NOTE — Patient Instructions (Addendum)
Recommendations for the patient: Avoid sugars and caffeine Keep a food and symptom diary to identify causitive factors Keep your bottom clean and dry without excessive wiping or using astringent cleaners Apply a barrier cream such as zinc oxide to the perianal skin Consider using incontinence pads to protect your skin and clothes from fecal soiling  Add on benefiber 1 TBSP or psyllium husk twice or three times a day to help form the stool and prevent incontinence. Can take imodium 1/2-1 pill 2 mg in the morning to help prevent diarrhea.    Fecal Incontinence Fecal incontinence, also called accidental bowel leakage, is not being able to control your bowels. This condition happens because the nerves or muscles around the anus do not work the way they should. This affects their ability to hold stool (feces). What are the causes? This condition may be caused by: Damage to the muscles at the end of the rectum (sphincter). Damage to the nerves that control bowel movements. Diarrhea. Chronic constipation. Pelvic floor dysfunction. This means the muscles in the pelvis do not work well. Loss of bowel storage capacity. This occurs when the rectum can no longer stretch in size in order to store feces. Inflammatory bowel disease (IBD), such as Crohn's disease. Irritable bowel syndrome (IBS). What increases the risk? You are more likely to develop this condition if you: Were born with bowels or a pelvis that did not form correctly. Have had rectal surgery. Have had radiation treatment for certain cancers. Have been pregnant, had a vaginal delivery, or had surgery that damaged the pelvic floor muscles. Had a complicated childbirth, spinal cord injury, or other trauma that caused nerve damage. Have a condition that can affect nerve function, such as diabetes, Parkinson's disease, or multiple sclerosis. Have a condition where the rectum drops down into the anus or vagina (prolapse). Are 70 years of  age or older. What are the signs or symptoms? The main symptom of this condition is not being able to control your bowels. You also might not be able to get to the bathroom before a bowel movement. How is this diagnosed? This condition is diagnosed with a medical history and physical exam. You may also have other tests, including: Blood tests. Urine tests. A rectal exam. Ultrasound. MRI. Colonoscopy. This is an exam that looks at your large intestine (colon). Anal manometry. This is a test that measures the strength of the anal sphincter. Anal electromyogram (EMG). This is a test that uses small electrodes to check for nerve damage. How is this treated? Treatment for this condition depends on the cause and severity. Treatment may also focus on addressing any underlying causes of this condition. Treatment may include: Medicines. This may include medicines to: Prevent diarrhea. Help with constipation (bulk-forming laxatives). Treat any underlying conditions. Biofeedback therapy. This can help to retrain muscles that are affected. Fiber supplements. These can help manage your bowel movements. Nerve stimulation. Injectable gel to promote tissue growth and better muscle control. Surgery. You may need: Sphincter repair surgery. Diversion surgery. This procedure lets feces pass out of your body through a hole in your abdomen. Follow these instructions at home: Eating and drinking  Follow instructions from your health care provider about any eating or drinking restrictions. Work with a dietitian to come up with a healthy diet that will help you avoid the foods that can make your condition worse. Keep a diet diary to find out which foods or drinks could be making your condition worse. Drink enough fluid to  keep your urine pale yellow. Lifestyle Do not use any products that contain nicotine or tobacco, such as cigarettes and e-cigarettes. If you need help quitting, ask your health care  provider. This may help your condition. If you are overweight, talk with your health care provider about how to safely lose weight. This may help your condition. Increase your physical activity as told by your health care provider. This may help your condition. Always talk with your health care provider before starting a new exercise program. Carry a change of clothes and supplies to clean up quickly if you have an episode of fetal incontinence. Consider joining a fecal incontinence support group. You can find a support group online or in your local community. General instructions  Take over-the-counter and prescription medicines only as told by your health care provider. This includes any supplements. Apply a moisture barrier, such as petroleum jelly, to your rectum. This protects the skin from irritation caused by ongoing leaking or diarrhea. Tell your health care provider if you are upset or depressed about your condition. Keep all follow-up visits as told by your health care provider. This is important. Where to find more information [employer] for Functional Gastrointestinal Disorders: iffgd.org [employer]: patients.gi.org Contact a health care provider if: You have a fever. You have redness, swelling, or pain around your rectum. Your pain is getting worse or you lose feeling in your rectal area. You have blood in your stool. You feel sad or hopeless. You avoid social or work situations. Get help right away if: You stop having bowel movements. You cannot eat or drink without vomiting. You have rectal bleeding that does not stop. You have severe pain that is getting worse. You have symptoms of dehydration, including: Sleepiness or fatigue. Producing little or no urine, tears, or sweat. Dizziness. Dry mouth. Unusual irritability. Headache. Inability to think clearly. Summary Fecal incontinence, also called accidental bowel leakage, is not  being able to control your bowels. This condition happens because the nerves or muscles around the anus do not work the way they should. Treatment varies depending on the cause and severity of your condition. Treatment may also focus on addressing any underlying causes of this condition. Follow instructions from your health care provider about any eating or drinking restrictions, lifestyle changes, and skin care. Take over-the-counter and prescription medicines only as told by your health care provider. This includes any supplements. Tell your health care provider if your symptoms worsen or if you are upset or depressed about your condition. This information is not intended to replace advice given to you by your health care provider. Make sure you discuss any questions you have with your health care provider. Document Revised: 05/09/2017 Document Reviewed: 05/09/2017 Elsevier Patient Education  2022 [employer].  We will place you on our hospital list and contact you about setting up a colonoscopy.  We are checking you for C-diff today with the stool sample we gathered today.  I appreciate the opportunity to care for you. Quentin Mulling PA-C

## 2022-11-13 ENCOUNTER — Other Ambulatory Visit: Payer: Self-pay

## 2022-11-13 ENCOUNTER — Telehealth: Payer: Self-pay | Admitting: Physician Assistant

## 2022-11-13 NOTE — Telephone Encounter (Signed)
Left message for pt to call back  °

## 2022-11-13 NOTE — Telephone Encounter (Signed)
Spoke with Pamela Roth and she is aware of results per Quentin Mulling PA. It does not look like Pamela Roth is scheduled for a hospital colon. Is this being worked on?

## 2022-11-13 NOTE — Telephone Encounter (Signed)
Negative Cdiff [employer].  Continue with current plan of colonoscopy at the hospital. Continue current medications, fiber and imodium as needed.

## 2022-12-05 DIAGNOSIS — J189 Pneumonia, unspecified organism: Secondary | ICD-10-CM | POA: Diagnosis not present

## 2022-12-05 DIAGNOSIS — J441 Chronic obstructive pulmonary disease with (acute) exacerbation: Secondary | ICD-10-CM | POA: Diagnosis not present

## 2022-12-05 DIAGNOSIS — R051 Acute cough: Secondary | ICD-10-CM | POA: Diagnosis not present

## 2022-12-11 DIAGNOSIS — J9611 Chronic respiratory failure with hypoxia: Secondary | ICD-10-CM | POA: Diagnosis not present

## 2022-12-11 DIAGNOSIS — R051 Acute cough: Secondary | ICD-10-CM | POA: Diagnosis not present

## 2022-12-11 DIAGNOSIS — M1711 Unilateral primary osteoarthritis, right knee: Secondary | ICD-10-CM | POA: Diagnosis not present

## 2022-12-11 DIAGNOSIS — J189 Pneumonia, unspecified organism: Secondary | ICD-10-CM | POA: Diagnosis not present

## 2022-12-11 DIAGNOSIS — J441 Chronic obstructive pulmonary disease with (acute) exacerbation: Secondary | ICD-10-CM | POA: Diagnosis not present

## 2022-12-11 DIAGNOSIS — I700 Atherosclerosis of aorta: Secondary | ICD-10-CM | POA: Diagnosis not present

## 2022-12-28 DIAGNOSIS — Z1231 Encounter for screening mammogram for malignant neoplasm of breast: Secondary | ICD-10-CM | POA: Diagnosis not present

## 2023-01-01 DIAGNOSIS — F419 Anxiety disorder, unspecified: Secondary | ICD-10-CM | POA: Diagnosis not present

## 2023-01-01 DIAGNOSIS — E042 Nontoxic multinodular goiter: Secondary | ICD-10-CM | POA: Diagnosis not present

## 2023-01-01 DIAGNOSIS — Z23 Encounter for immunization: Secondary | ICD-10-CM | POA: Diagnosis not present

## 2023-01-01 DIAGNOSIS — J9691 Respiratory failure, unspecified with hypoxia: Secondary | ICD-10-CM | POA: Diagnosis not present

## 2023-01-01 DIAGNOSIS — I700 Atherosclerosis of aorta: Secondary | ICD-10-CM | POA: Diagnosis not present

## 2023-01-01 DIAGNOSIS — I10 Essential (primary) hypertension: Secondary | ICD-10-CM | POA: Diagnosis not present

## 2023-01-01 DIAGNOSIS — L659 Nonscarring hair loss, unspecified: Secondary | ICD-10-CM | POA: Diagnosis not present

## 2023-01-01 DIAGNOSIS — J449 Chronic obstructive pulmonary disease, unspecified: Secondary | ICD-10-CM | POA: Diagnosis not present

## 2023-01-01 DIAGNOSIS — Z0000 Encounter for general adult medical examination without abnormal findings: Secondary | ICD-10-CM | POA: Diagnosis not present

## 2023-01-01 DIAGNOSIS — Z1211 Encounter for screening for malignant neoplasm of colon: Secondary | ICD-10-CM | POA: Diagnosis not present

## 2023-01-01 DIAGNOSIS — Z9981 Dependence on supplemental oxygen: Secondary | ICD-10-CM | POA: Diagnosis not present

## 2023-01-01 DIAGNOSIS — E7800 Pure hypercholesterolemia, unspecified: Secondary | ICD-10-CM | POA: Diagnosis not present

## 2023-01-01 DIAGNOSIS — F329 Major depressive disorder, single episode, unspecified: Secondary | ICD-10-CM | POA: Diagnosis not present

## 2023-01-03 ENCOUNTER — Other Ambulatory Visit: Payer: Self-pay | Admitting: Internal Medicine

## 2023-01-03 DIAGNOSIS — R053 Chronic cough: Secondary | ICD-10-CM

## 2023-01-03 DIAGNOSIS — Z1211 Encounter for screening for malignant neoplasm of colon: Secondary | ICD-10-CM | POA: Diagnosis not present

## 2023-01-11 ENCOUNTER — Ambulatory Visit (INDEPENDENT_AMBULATORY_CARE_PROVIDER_SITE_OTHER): Payer: Medicare Other | Admitting: Physician Assistant

## 2023-01-11 ENCOUNTER — Encounter: Payer: Self-pay | Admitting: Physician Assistant

## 2023-01-11 VITALS — BP 124/70 | HR 72 | Ht 65.0 in | Wt 180.2 lb

## 2023-01-11 DIAGNOSIS — E785 Hyperlipidemia, unspecified: Secondary | ICD-10-CM

## 2023-01-11 DIAGNOSIS — F32A Depression, unspecified: Secondary | ICD-10-CM | POA: Diagnosis not present

## 2023-01-11 DIAGNOSIS — F419 Anxiety disorder, unspecified: Secondary | ICD-10-CM

## 2023-01-11 DIAGNOSIS — I10 Essential (primary) hypertension: Secondary | ICD-10-CM

## 2023-01-11 DIAGNOSIS — Z87891 Personal history of nicotine dependence: Secondary | ICD-10-CM

## 2023-01-11 DIAGNOSIS — D696 Thrombocytopenia, unspecified: Secondary | ICD-10-CM

## 2023-01-11 DIAGNOSIS — R159 Full incontinence of feces: Secondary | ICD-10-CM

## 2023-01-11 DIAGNOSIS — J449 Chronic obstructive pulmonary disease, unspecified: Secondary | ICD-10-CM

## 2023-01-11 DIAGNOSIS — R197 Diarrhea, unspecified: Secondary | ICD-10-CM

## 2023-01-11 NOTE — Patient Instructions (Addendum)
 Can do 1 mg or 2 mg of the imodium daily with the fiber Here are some more tips  Toileting tips to help with your constipation - Establish a time to try to move your bowels every day.  For many people, this is after a cup of coffee or after a meal such as breakfast. - Sit all of the way back on the toilet keeping your back fairly straight and while sitting up, try to rest the tops of your forearms on your upper thighs.   - Raising your feet with a step stool/squatty potty can be helpful to improve the angle that allows your stool to pass through the rectum. - Relax the rectum feeling it bulge toward the toilet water .  If you feel your rectum raising toward your body, you are contracting rather than relaxing. - Breathe in and slowly exhale. Belly breath by expanding your belly towards your belly button. Keep belly expanded as you gently direct pressure down and back to the anus.  A low pitched GRRR sound can assist with increasing intra-abdominal pressure.  (Can also trying to blow on a pinwheel and make it move, this helps with the same belly breathing) - Repeat 3-4 times. If unsuccessful, contract the pelvic floor to restore normal tone and get off the toilet.  Avoid excessive straining. - To reduce excessive wiping by teaching your anus to normally contract, place hands on outer aspect of knees and resist knee movement outward.  Hold 5-10 second then place hands just inside of knees and resist inward movement of knees.  Hold 5 seconds.  Repeat a few times each way.  Go to the ER if unable to pass gas, severe AB pain, unable to hold down food, any shortness of breath of chest pain.   Fiber Chart  You should be consuming 25-30g of fiber per day and drinking 8 glasses of water  to help your bowels move regularly.  In the chart below you can look up how much fiber you are getting in an average day.  If you are not getting enough fiber, you should add a fiber supplement to your diet.  Examples of  this include Metamucil, FiberCon and Citrucel.  These can be purchased at your local grocery store or pharmacy.      [email or web address]  _______________________________________________________  If your blood pressure at your visit was 140/90 or greater, please contact your primary care physician to follow up on this.  _______________________________________________________  If you are age 73 or older, your body mass index should be between 23-30. Your Body mass index is 30 kg/m. If this is out of the aforementioned range listed, please consider follow up with your Primary Care Provider.  If you are age 49 or younger, your body mass index should be between 19-25. Your Body mass index is 30 kg/m. If this is out of the aformentioned range listed, please consider follow up with your Primary Care Provider.   ________________________________________________________  The [facility] providers would like to encourage you to use MYCHART to communicate with providers for non-urgent requests or questions.  Due to long hold times on the telephone, sending your provider a message by [facility] may be a faster and more efficient way to get a response.  Please allow 48 business hours for a response.  Please remember that this is for non-urgent requests.  _______________________________________________________   I appreciate the  opportunity to care for you  Thank You   [facility]

## 2023-01-11 NOTE — Progress Notes (Signed)
 01/11/2023 Pamela Roth [id number] February 24, 1950  Referring provider: No ref. provider found Primary GI doctor: Dr. Shila  ASSESSMENT AND PLAN:  Fecal Incontinence Has improved with imodium and fiber daily No weight loss, no hematochezia, negative FOBT here, negative FIT test with PCP recently No family history of colon cancer, no personal history of polyps She would prefer to not have a colonoscopy if it is not needed due to her age and high risk with COPD with chronic hypoxia, she understands risk of potentially missing a cancer. Will do close follow up, patient understands that if she has anemia, worsening Bm's or overt GI bleeding we should proceed with colonoscopy in the hospital  Chronic Obstructive Pulmonary Disease (COPD) - now on daily/chronic and doing well  Hypertension and Hyperlipidemia On Losartan , Amlodipine, and Simvastatin . -Continue current medications.  Depression/Anxiety On Trazodone and Cymbalta. -Continue current medications.   Patient Care Team: Vernon Velna SAUNDERS, MD as PCP - General (Internal Medicine) Vernon Velna SAUNDERS, MD as Consulting Physician (Internal Medicine)  HISTORY OF PRESENT ILLNESS: 73 y.o. female with a past medical history of HTN, hyperlipidemia, aortic atherosclerosis, COPD with history of respiratory failure dependent on oxygen , diverticulosis and others listed below presents for evaluation of fecal incontinence.   Patient previously saw Dr Saintclair with [name]. 10/13/2009 colonoscopy Dr. Dianna showed hyperplastic polyp, benign lymphoid polyp, hyperplastic polyp benign lymphoid polyp recall 10 years 11/06/2022 patient seen in the office for fecal incontinence patient had negative C. difficile, started on fiber to bulk stool, loperamide as needed and scheduled for colonoscopy at the hospital due to chronic hypoxia on oxygen . FOBT - 11/05/2022, on rectal exam patient had poor rectal tone no masses no hemorrhoids FOBT  negative  She is on imodium once daily or every other day, on probiotic and fiber. She has not had any further fecal incontinence. She states her Bm's are formed now, 2-3 times a day. No hematochezia.  She had normal FIT with her PCP recently.   She  reports that she quit smoking about 15 years ago. Her smoking use included cigarettes. She has never used smokeless tobacco. She reports that she does not drink alcohol and does not use drugs.  RELEVANT LABS AND IMAGING:  CBC    Component Value Date/Time   WBC 8.8 11/02/2019 0513   RBC 4.52 11/02/2019 0513   HGB 13.7 11/02/2019 0513   HCT 40.1 11/02/2019 0513   PLT 229 11/02/2019 0513   MCV 88.7 11/02/2019 0513   MCH 30.3 11/02/2019 0513   MCHC 34.2 11/02/2019 0513   RDW 15.4 11/02/2019 0513   LYMPHSABS 1.0 11/02/2019 0513   MONOABS 0.3 11/02/2019 0513   EOSABS 0.0 11/02/2019 0513   BASOSABS 0.0 11/02/2019 0513   No results for input(s): HGB in the last 8760 hours.  CMP     Component Value Date/Time   NA 137 11/02/2019 0513   K 4.3 11/02/2019 0513   CL 106 11/02/2019 0513   CO2 24 11/02/2019 0513   GLUCOSE 163 (H) 11/02/2019 0513   BUN 24 (H) 11/02/2019 0513   CREATININE 0.91 11/02/2019 0513   CALCIUM 8.9 11/02/2019 0513   PROT 6.0 (L) 11/02/2019 0513   ALBUMIN 2.6 (L) 11/02/2019 0513   AST 20 11/02/2019 0513   ALT 23 11/02/2019 0513   ALKPHOS 38 11/02/2019 0513   BILITOT 0.4 11/02/2019 0513   GFRNONAA >60 11/02/2019 0513   GFRAA >60 05/05/2019 0244      Latest Ref Rng &  Units 11/02/2019    5:13 AM 11/01/2019    3:55 AM 10/31/2019    3:01 AM  Hepatic Function  Total Protein 6.5 - 8.1 g/dL 6.0  6.1  6.1   Albumin 3.5 - 5.0 g/dL 2.6  2.9  2.8   AST 15 - 41 U/L 20  28  34   ALT 0 - 44 U/L 23  26  24    Alk Phosphatase 38 - 126 U/L 38  45  39   Total Bilirubin 0.3 - 1.2 mg/dL 0.4  0.7  0.3       Current Medications:    Current Outpatient Medications (Cardiovascular):    amLODipine (NORVASC) 5 MG tablet,  Take 5 mg by mouth daily.   losartan  (COZAAR ) 50 MG tablet, Take 50 mg by mouth daily.   simvastatin  (ZOCOR ) 20 MG tablet, Take 20 mg by mouth daily.   Current Outpatient Medications (Respiratory):    benzonatate (TESSALON) 100 MG capsule, Take 100 mg by mouth 3 (three) times daily as needed.   fluticasone (FLONASE) 50 MCG/ACT nasal spray, Place 2 sprays into both nostrils as needed.   SYMBICORT 80-4.5 MCG/ACT inhaler,   Current Outpatient Medications (Analgesics):    meloxicam (MOBIC) 15 MG tablet, Take 15 mg by mouth as needed.   Current Outpatient Medications (Other):    diclofenac Sodium (VOLTAREN) 1 % GEL, Apply topically.   Fluocinolone Acetonide Scalp 0.01 % OIL, Apply 1 Application topically as needed.   Multiple Vitamins-Minerals (ONE-A-DAY PROACTIVE 65+) TABS, Take 1 tablet by mouth daily with breakfast.   OXYGEN , Inhale 2-6 L/min into the lungs as needed.   traZODone (DESYREL) 100 MG tablet, Take 100 mg by mouth at bedtime as needed.  Medical History:  Past Medical History:  Diagnosis Date   Anxiety    Arthritis    Benign lymphoid polyp of colon    Bilateral leg edema    COPD (chronic obstructive pulmonary disease) (HCC)    Depression    Diverticulosis    Hyperlipidemia    Hyperplastic polyp of sigmoid colon    Hypertension    Multinodular goiter    Allergies: No Known Allergies   Surgical History:  She  has a past surgical history that includes Partial hysterectomy (1980s); Total hip arthroplasty (Right, 05/04/2019); and Colonoscopy. Family History:  Her family history includes Asthma in her daughter; Dementia in her mother; Hypertension in her mother; Schizophrenia in her mother; Thyroid  cancer in her daughter.  REVIEW OF SYSTEMS  : All other systems reviewed and negative except where noted in the History of Present Illness.  PHYSICAL EXAM: BP 124/70 (BP Location: Left Arm, Patient Position: Sitting, Cuff Size: Normal)   Pulse 72   Ht 5' 5 (1.651 m)   Wt  180 lb 4 oz (81.8 kg)   BMI 30.00 kg/m  General Appearance: Well appearing female, in no apparent distress. Head:   Normocephalic and atraumatic. Eyes:  sclerae anicteric,conjunctive pink  Respiratory: Respiratory effort normal, decreased breath sounds, on 2L O2 Cardio: RRR with no MRGs. Peripheral pulses intact.  Abdomen: Soft,  Obese ,active bowel sounds. No tenderness . Without guarding and Without rebound. No masses. Rectal: declines Musculoskeletal: Full ROM, Normal gait. Without edema. Skin:  Dry and intact without significant lesions or rashes Neuro: Alert and  oriented x4;  No focal deficits. Psych:  Cooperative. Normal mood and affect.    Alan JONELLE Coombs, PA-C 10:24 AM

## 2023-01-17 ENCOUNTER — Ambulatory Visit
Admission: RE | Admit: 2023-01-17 | Discharge: 2023-01-17 | Disposition: A | Discharge status: Home / Self Care | Payer: Medicare Other | Source: Ambulatory Visit | Attending: Internal Medicine | Admitting: Internal Medicine

## 2023-01-17 DIAGNOSIS — I2510 Atherosclerotic heart disease of native coronary artery without angina pectoris: Secondary | ICD-10-CM | POA: Diagnosis not present

## 2023-01-17 DIAGNOSIS — J439 Emphysema, unspecified: Secondary | ICD-10-CM | POA: Diagnosis not present

## 2023-01-17 DIAGNOSIS — R053 Chronic cough: Secondary | ICD-10-CM

## 2023-01-17 DIAGNOSIS — R911 Solitary pulmonary nodule: Secondary | ICD-10-CM | POA: Diagnosis not present

## 2023-01-17 DIAGNOSIS — R059 Cough, unspecified: Secondary | ICD-10-CM | POA: Diagnosis not present

## 2023-01-31 DIAGNOSIS — N958 Other specified menopausal and perimenopausal disorders: Secondary | ICD-10-CM | POA: Diagnosis not present

## 2023-02-06 DIAGNOSIS — R262 Difficulty in walking, not elsewhere classified: Secondary | ICD-10-CM | POA: Diagnosis not present

## 2023-02-06 DIAGNOSIS — M25562 Pain in left knee: Secondary | ICD-10-CM | POA: Diagnosis not present

## 2023-02-06 DIAGNOSIS — M25561 Pain in right knee: Secondary | ICD-10-CM | POA: Diagnosis not present

## 2023-02-06 DIAGNOSIS — M170 Bilateral primary osteoarthritis of knee: Secondary | ICD-10-CM | POA: Diagnosis not present

## 2023-02-12 DIAGNOSIS — E042 Nontoxic multinodular goiter: Secondary | ICD-10-CM | POA: Diagnosis not present

## 2023-02-18 DIAGNOSIS — M170 Bilateral primary osteoarthritis of knee: Secondary | ICD-10-CM | POA: Diagnosis not present

## 2023-02-18 DIAGNOSIS — M25561 Pain in right knee: Secondary | ICD-10-CM | POA: Diagnosis not present

## 2023-02-18 DIAGNOSIS — M25562 Pain in left knee: Secondary | ICD-10-CM | POA: Diagnosis not present

## 2023-02-18 DIAGNOSIS — Z1330 Encounter for screening examination for mental health and behavioral disorders, unspecified: Secondary | ICD-10-CM | POA: Diagnosis not present

## 2023-02-25 DIAGNOSIS — M79662 Pain in left lower leg: Secondary | ICD-10-CM | POA: Diagnosis not present

## 2023-02-25 DIAGNOSIS — M79604 Pain in right leg: Secondary | ICD-10-CM | POA: Diagnosis not present

## 2023-02-25 DIAGNOSIS — I83813 Varicose veins of bilateral lower extremities with pain: Secondary | ICD-10-CM | POA: Diagnosis not present

## 2023-02-25 DIAGNOSIS — M79605 Pain in left leg: Secondary | ICD-10-CM | POA: Diagnosis not present

## 2023-02-25 DIAGNOSIS — M7989 Other specified soft tissue disorders: Secondary | ICD-10-CM | POA: Diagnosis not present

## 2023-02-25 DIAGNOSIS — M79661 Pain in right lower leg: Secondary | ICD-10-CM | POA: Diagnosis not present

## 2023-03-14 ENCOUNTER — Ambulatory Visit: Payer: Medicare Other | Admitting: Physician Assistant

## 2023-03-14 DIAGNOSIS — M170 Bilateral primary osteoarthritis of knee: Secondary | ICD-10-CM | POA: Diagnosis not present

## 2023-03-14 DIAGNOSIS — Z8709 Personal history of other diseases of the respiratory system: Secondary | ICD-10-CM | POA: Diagnosis not present

## 2023-03-19 DIAGNOSIS — R946 Abnormal results of thyroid function studies: Secondary | ICD-10-CM | POA: Diagnosis not present

## 2023-03-19 DIAGNOSIS — Z131 Encounter for screening for diabetes mellitus: Secondary | ICD-10-CM | POA: Diagnosis not present

## 2023-03-19 DIAGNOSIS — J9611 Chronic respiratory failure with hypoxia: Secondary | ICD-10-CM | POA: Diagnosis not present

## 2023-03-19 DIAGNOSIS — M810 Age-related osteoporosis without current pathological fracture: Secondary | ICD-10-CM | POA: Diagnosis not present

## 2023-03-19 DIAGNOSIS — M170 Bilateral primary osteoarthritis of knee: Secondary | ICD-10-CM | POA: Diagnosis not present

## 2023-03-19 DIAGNOSIS — F5104 Psychophysiologic insomnia: Secondary | ICD-10-CM | POA: Diagnosis not present

## 2023-03-19 DIAGNOSIS — E7849 Other hyperlipidemia: Secondary | ICD-10-CM | POA: Diagnosis not present

## 2023-03-19 DIAGNOSIS — J449 Chronic obstructive pulmonary disease, unspecified: Secondary | ICD-10-CM | POA: Diagnosis not present

## 2023-03-19 DIAGNOSIS — I10 Essential (primary) hypertension: Secondary | ICD-10-CM | POA: Diagnosis not present

## 2023-03-20 ENCOUNTER — Telehealth: Payer: Self-pay | Admitting: Pulmonary Disease

## 2023-03-20 NOTE — Telephone Encounter (Signed)
 Patient is in person and needs medical records to be released to Dr.Adnad Javaid [facility] in [place]. She needs office vists note, clinic summary,physical exam,lab reports and radiology reports. It can be faxed to [phone]. The Patient Request for Access form has been uploaded to her chart under documentation. She needs them sent before Friday March 14 at 8:00am. That is her next appointment with them. Patient can be reached at [phone]

## 2023-03-20 NOTE — Telephone Encounter (Signed)
 A copy was also placed in Dr.Dewald's box.

## 2023-03-20 NOTE — Telephone Encounter (Signed)
 We do not do this. Pt has to call [facility] records  [phone]  I called and spoke to pt. Pt requested that our office fax over all her information to this new provider. I informed pt that our office does not do this, and I explained to her that she has to call medical records. I provided the phone number as well. Pt verbalized understanding. NFN

## 2023-03-22 DIAGNOSIS — J9611 Chronic respiratory failure with hypoxia: Secondary | ICD-10-CM | POA: Diagnosis not present

## 2023-03-22 DIAGNOSIS — Z1330 Encounter for screening examination for mental health and behavioral disorders, unspecified: Secondary | ICD-10-CM | POA: Diagnosis not present

## 2023-03-22 DIAGNOSIS — G4710 Hypersomnia, unspecified: Secondary | ICD-10-CM | POA: Diagnosis not present

## 2023-03-22 DIAGNOSIS — J449 Chronic obstructive pulmonary disease, unspecified: Secondary | ICD-10-CM | POA: Diagnosis not present

## 2023-03-25 DIAGNOSIS — E041 Nontoxic single thyroid nodule: Secondary | ICD-10-CM | POA: Diagnosis not present

## 2023-03-25 DIAGNOSIS — J9611 Chronic respiratory failure with hypoxia: Secondary | ICD-10-CM | POA: Diagnosis not present

## 2023-03-25 DIAGNOSIS — J432 Centrilobular emphysema: Secondary | ICD-10-CM | POA: Diagnosis not present

## 2023-03-25 DIAGNOSIS — J439 Emphysema, unspecified: Secondary | ICD-10-CM | POA: Diagnosis not present

## 2023-03-25 DIAGNOSIS — J9811 Atelectasis: Secondary | ICD-10-CM | POA: Diagnosis not present

## 2023-03-26 ENCOUNTER — Encounter: Payer: Self-pay | Admitting: Physician Assistant

## 2023-04-02 DIAGNOSIS — G4710 Hypersomnia, unspecified: Secondary | ICD-10-CM | POA: Diagnosis not present

## 2023-04-03 DIAGNOSIS — I10 Essential (primary) hypertension: Secondary | ICD-10-CM | POA: Diagnosis not present

## 2023-04-08 DIAGNOSIS — R0602 Shortness of breath: Secondary | ICD-10-CM | POA: Diagnosis not present

## 2023-04-16 DIAGNOSIS — I10 Essential (primary) hypertension: Secondary | ICD-10-CM | POA: Diagnosis not present

## 2023-04-16 DIAGNOSIS — J449 Chronic obstructive pulmonary disease, unspecified: Secondary | ICD-10-CM | POA: Diagnosis not present

## 2023-04-16 DIAGNOSIS — Z01810 Encounter for preprocedural cardiovascular examination: Secondary | ICD-10-CM | POA: Diagnosis not present

## 2023-04-16 DIAGNOSIS — Z01818 Encounter for other preprocedural examination: Secondary | ICD-10-CM | POA: Diagnosis not present

## 2023-04-16 DIAGNOSIS — F419 Anxiety disorder, unspecified: Secondary | ICD-10-CM | POA: Diagnosis not present

## 2023-04-16 DIAGNOSIS — M25561 Pain in right knee: Secondary | ICD-10-CM | POA: Diagnosis not present

## 2023-04-16 DIAGNOSIS — M1711 Unilateral primary osteoarthritis, right knee: Secondary | ICD-10-CM | POA: Diagnosis not present

## 2023-04-16 DIAGNOSIS — E785 Hyperlipidemia, unspecified: Secondary | ICD-10-CM | POA: Diagnosis not present

## 2023-04-24 DIAGNOSIS — J9611 Chronic respiratory failure with hypoxia: Secondary | ICD-10-CM | POA: Diagnosis not present

## 2023-04-24 DIAGNOSIS — G4710 Hypersomnia, unspecified: Secondary | ICD-10-CM | POA: Diagnosis not present

## 2023-04-24 DIAGNOSIS — Z87891 Personal history of nicotine dependence: Secondary | ICD-10-CM | POA: Diagnosis not present

## 2023-04-24 DIAGNOSIS — J432 Centrilobular emphysema: Secondary | ICD-10-CM | POA: Diagnosis not present

## 2023-04-29 DIAGNOSIS — E079 Disorder of thyroid, unspecified: Secondary | ICD-10-CM | POA: Diagnosis not present

## 2023-04-29 DIAGNOSIS — G8918 Other acute postprocedural pain: Secondary | ICD-10-CM | POA: Diagnosis not present

## 2023-04-29 DIAGNOSIS — J439 Emphysema, unspecified: Secondary | ICD-10-CM | POA: Diagnosis not present

## 2023-04-29 DIAGNOSIS — F419 Anxiety disorder, unspecified: Secondary | ICD-10-CM | POA: Diagnosis not present

## 2023-04-29 DIAGNOSIS — I10 Essential (primary) hypertension: Secondary | ICD-10-CM | POA: Diagnosis not present

## 2023-04-29 DIAGNOSIS — Z79899 Other long term (current) drug therapy: Secondary | ICD-10-CM | POA: Diagnosis not present

## 2023-04-29 DIAGNOSIS — Z7982 Long term (current) use of aspirin: Secondary | ICD-10-CM | POA: Diagnosis not present

## 2023-04-29 DIAGNOSIS — M25761 Osteophyte, right knee: Secondary | ICD-10-CM | POA: Diagnosis not present

## 2023-04-29 DIAGNOSIS — Z87891 Personal history of nicotine dependence: Secondary | ICD-10-CM | POA: Diagnosis not present

## 2023-04-29 DIAGNOSIS — M1711 Unilateral primary osteoarthritis, right knee: Secondary | ICD-10-CM | POA: Diagnosis not present

## 2023-04-30 DIAGNOSIS — I10 Essential (primary) hypertension: Secondary | ICD-10-CM | POA: Diagnosis not present

## 2023-04-30 DIAGNOSIS — J439 Emphysema, unspecified: Secondary | ICD-10-CM | POA: Diagnosis not present

## 2023-04-30 DIAGNOSIS — E079 Disorder of thyroid, unspecified: Secondary | ICD-10-CM | POA: Diagnosis not present

## 2023-04-30 DIAGNOSIS — F419 Anxiety disorder, unspecified: Secondary | ICD-10-CM | POA: Diagnosis not present

## 2023-04-30 DIAGNOSIS — M25761 Osteophyte, right knee: Secondary | ICD-10-CM | POA: Diagnosis not present

## 2023-04-30 DIAGNOSIS — M1711 Unilateral primary osteoarthritis, right knee: Secondary | ICD-10-CM | POA: Diagnosis not present

## 2023-05-02 DIAGNOSIS — M170 Bilateral primary osteoarthritis of knee: Secondary | ICD-10-CM | POA: Diagnosis not present

## 2023-05-02 DIAGNOSIS — M25561 Pain in right knee: Secondary | ICD-10-CM | POA: Diagnosis not present

## 2023-05-02 DIAGNOSIS — M25562 Pain in left knee: Secondary | ICD-10-CM | POA: Diagnosis not present

## 2023-05-06 DIAGNOSIS — M25562 Pain in left knee: Secondary | ICD-10-CM | POA: Diagnosis not present

## 2023-05-06 DIAGNOSIS — M25561 Pain in right knee: Secondary | ICD-10-CM | POA: Diagnosis not present

## 2023-05-06 DIAGNOSIS — M170 Bilateral primary osteoarthritis of knee: Secondary | ICD-10-CM | POA: Diagnosis not present

## 2023-05-09 DIAGNOSIS — M170 Bilateral primary osteoarthritis of knee: Secondary | ICD-10-CM | POA: Diagnosis not present

## 2023-05-09 DIAGNOSIS — M25562 Pain in left knee: Secondary | ICD-10-CM | POA: Diagnosis not present

## 2023-05-09 DIAGNOSIS — M25561 Pain in right knee: Secondary | ICD-10-CM | POA: Diagnosis not present

## 2023-05-13 DIAGNOSIS — M25561 Pain in right knee: Secondary | ICD-10-CM | POA: Diagnosis not present

## 2023-05-13 DIAGNOSIS — M170 Bilateral primary osteoarthritis of knee: Secondary | ICD-10-CM | POA: Diagnosis not present

## 2023-05-13 DIAGNOSIS — M25562 Pain in left knee: Secondary | ICD-10-CM | POA: Diagnosis not present

## 2023-05-16 DIAGNOSIS — Z96651 Presence of right artificial knee joint: Secondary | ICD-10-CM | POA: Diagnosis not present

## 2023-05-17 DIAGNOSIS — M25561 Pain in right knee: Secondary | ICD-10-CM | POA: Diagnosis not present

## 2023-05-17 DIAGNOSIS — Z96651 Presence of right artificial knee joint: Secondary | ICD-10-CM | POA: Diagnosis not present

## 2023-05-17 DIAGNOSIS — M170 Bilateral primary osteoarthritis of knee: Secondary | ICD-10-CM | POA: Diagnosis not present

## 2023-05-17 DIAGNOSIS — M25562 Pain in left knee: Secondary | ICD-10-CM | POA: Diagnosis not present

## 2023-05-17 DIAGNOSIS — M7989 Other specified soft tissue disorders: Secondary | ICD-10-CM | POA: Diagnosis not present

## 2023-05-20 DIAGNOSIS — M25561 Pain in right knee: Secondary | ICD-10-CM | POA: Diagnosis not present

## 2023-05-20 DIAGNOSIS — M25562 Pain in left knee: Secondary | ICD-10-CM | POA: Diagnosis not present

## 2023-05-20 DIAGNOSIS — M170 Bilateral primary osteoarthritis of knee: Secondary | ICD-10-CM | POA: Diagnosis not present

## 2023-05-23 DIAGNOSIS — M25561 Pain in right knee: Secondary | ICD-10-CM | POA: Diagnosis not present

## 2023-05-23 DIAGNOSIS — M25562 Pain in left knee: Secondary | ICD-10-CM | POA: Diagnosis not present

## 2023-05-23 DIAGNOSIS — M170 Bilateral primary osteoarthritis of knee: Secondary | ICD-10-CM | POA: Diagnosis not present

## 2023-05-27 DIAGNOSIS — M25561 Pain in right knee: Secondary | ICD-10-CM | POA: Diagnosis not present

## 2023-05-27 DIAGNOSIS — M25562 Pain in left knee: Secondary | ICD-10-CM | POA: Diagnosis not present

## 2023-05-27 DIAGNOSIS — M170 Bilateral primary osteoarthritis of knee: Secondary | ICD-10-CM | POA: Diagnosis not present

## 2023-06-04 DIAGNOSIS — R159 Full incontinence of feces: Secondary | ICD-10-CM | POA: Diagnosis not present

## 2023-06-06 DIAGNOSIS — M25562 Pain in left knee: Secondary | ICD-10-CM | POA: Diagnosis not present

## 2023-06-06 DIAGNOSIS — M170 Bilateral primary osteoarthritis of knee: Secondary | ICD-10-CM | POA: Diagnosis not present

## 2023-06-06 DIAGNOSIS — M25561 Pain in right knee: Secondary | ICD-10-CM | POA: Diagnosis not present

## 2023-06-10 DIAGNOSIS — M25561 Pain in right knee: Secondary | ICD-10-CM | POA: Diagnosis not present

## 2023-06-10 DIAGNOSIS — M170 Bilateral primary osteoarthritis of knee: Secondary | ICD-10-CM | POA: Diagnosis not present

## 2023-06-10 DIAGNOSIS — M25562 Pain in left knee: Secondary | ICD-10-CM | POA: Diagnosis not present

## 2023-06-13 DIAGNOSIS — M25561 Pain in right knee: Secondary | ICD-10-CM | POA: Diagnosis not present

## 2023-06-13 DIAGNOSIS — M25562 Pain in left knee: Secondary | ICD-10-CM | POA: Diagnosis not present

## 2023-06-13 DIAGNOSIS — M170 Bilateral primary osteoarthritis of knee: Secondary | ICD-10-CM | POA: Diagnosis not present

## 2023-06-17 DIAGNOSIS — M25562 Pain in left knee: Secondary | ICD-10-CM | POA: Diagnosis not present

## 2023-06-17 DIAGNOSIS — M25561 Pain in right knee: Secondary | ICD-10-CM | POA: Diagnosis not present

## 2023-06-17 DIAGNOSIS — M170 Bilateral primary osteoarthritis of knee: Secondary | ICD-10-CM | POA: Diagnosis not present

## 2023-06-20 DIAGNOSIS — M25561 Pain in right knee: Secondary | ICD-10-CM | POA: Diagnosis not present

## 2023-06-20 DIAGNOSIS — M170 Bilateral primary osteoarthritis of knee: Secondary | ICD-10-CM | POA: Diagnosis not present

## 2023-06-20 DIAGNOSIS — M25562 Pain in left knee: Secondary | ICD-10-CM | POA: Diagnosis not present

## 2023-06-24 DIAGNOSIS — M170 Bilateral primary osteoarthritis of knee: Secondary | ICD-10-CM | POA: Diagnosis not present

## 2023-06-24 DIAGNOSIS — M25562 Pain in left knee: Secondary | ICD-10-CM | POA: Diagnosis not present

## 2023-06-24 DIAGNOSIS — M25561 Pain in right knee: Secondary | ICD-10-CM | POA: Diagnosis not present

## 2023-06-27 DIAGNOSIS — M25561 Pain in right knee: Secondary | ICD-10-CM | POA: Diagnosis not present

## 2023-06-27 DIAGNOSIS — M25562 Pain in left knee: Secondary | ICD-10-CM | POA: Diagnosis not present

## 2023-06-27 DIAGNOSIS — M170 Bilateral primary osteoarthritis of knee: Secondary | ICD-10-CM | POA: Diagnosis not present

## 2023-07-01 DIAGNOSIS — M25561 Pain in right knee: Secondary | ICD-10-CM | POA: Diagnosis not present

## 2023-07-01 DIAGNOSIS — M170 Bilateral primary osteoarthritis of knee: Secondary | ICD-10-CM | POA: Diagnosis not present

## 2023-07-01 DIAGNOSIS — M25562 Pain in left knee: Secondary | ICD-10-CM | POA: Diagnosis not present

## 2023-07-04 DIAGNOSIS — M170 Bilateral primary osteoarthritis of knee: Secondary | ICD-10-CM | POA: Diagnosis not present

## 2023-07-04 DIAGNOSIS — M25562 Pain in left knee: Secondary | ICD-10-CM | POA: Diagnosis not present

## 2023-07-04 DIAGNOSIS — M25561 Pain in right knee: Secondary | ICD-10-CM | POA: Diagnosis not present

## 2023-07-25 DIAGNOSIS — Z96651 Presence of right artificial knee joint: Secondary | ICD-10-CM | POA: Diagnosis not present

## 2023-08-14 ENCOUNTER — Telehealth: Payer: Self-pay | Admitting: Podiatry

## 2023-08-14 DIAGNOSIS — M25561 Pain in right knee: Secondary | ICD-10-CM | POA: Diagnosis not present

## 2023-08-14 DIAGNOSIS — M25569 Pain in unspecified knee: Secondary | ICD-10-CM | POA: Diagnosis not present

## 2023-08-14 DIAGNOSIS — M25562 Pain in left knee: Secondary | ICD-10-CM | POA: Diagnosis not present

## 2023-08-14 DIAGNOSIS — M170 Bilateral primary osteoarthritis of knee: Secondary | ICD-10-CM | POA: Diagnosis not present

## 2023-08-14 DIAGNOSIS — Z96651 Presence of right artificial knee joint: Secondary | ICD-10-CM | POA: Diagnosis not present

## 2023-08-14 NOTE — Telephone Encounter (Signed)
 The patient is very frustrated as she has been trying to obtain her medical records from Dr. Christine for over a month now and was given [phone] for [facility] Medical records. She has not received any response. She drove from [place] yesterday  to the office, but we were closed due to a power outage.  She is requesting a call back for assistance and would like to receive her records as soon as possible, as she is transferring to another provider next week.  Thank you.

## 2023-08-14 NOTE — Telephone Encounter (Signed)
 Patient would like to pick up. Please call [phone] when they are ready. Thank you.

## 2023-08-16 DIAGNOSIS — G4710 Hypersomnia, unspecified: Secondary | ICD-10-CM | POA: Diagnosis not present

## 2023-08-16 DIAGNOSIS — G4734 Idiopathic sleep related nonobstructive alveolar hypoventilation: Secondary | ICD-10-CM | POA: Diagnosis not present

## 2023-08-20 DIAGNOSIS — Z96651 Presence of right artificial knee joint: Secondary | ICD-10-CM | POA: Diagnosis not present

## 2023-08-20 DIAGNOSIS — M25569 Pain in unspecified knee: Secondary | ICD-10-CM | POA: Diagnosis not present

## 2023-08-20 DIAGNOSIS — M25561 Pain in right knee: Secondary | ICD-10-CM | POA: Diagnosis not present

## 2023-08-20 DIAGNOSIS — M170 Bilateral primary osteoarthritis of knee: Secondary | ICD-10-CM | POA: Diagnosis not present

## 2023-08-20 DIAGNOSIS — M25562 Pain in left knee: Secondary | ICD-10-CM | POA: Diagnosis not present

## 2023-08-23 DIAGNOSIS — B351 Tinea unguium: Secondary | ICD-10-CM | POA: Diagnosis not present

## 2023-08-23 DIAGNOSIS — L603 Nail dystrophy: Secondary | ICD-10-CM | POA: Diagnosis not present

## 2023-08-27 DIAGNOSIS — M25562 Pain in left knee: Secondary | ICD-10-CM | POA: Diagnosis not present

## 2023-08-27 DIAGNOSIS — M25561 Pain in right knee: Secondary | ICD-10-CM | POA: Diagnosis not present

## 2023-08-27 DIAGNOSIS — M170 Bilateral primary osteoarthritis of knee: Secondary | ICD-10-CM | POA: Diagnosis not present

## 2023-08-27 DIAGNOSIS — Z96651 Presence of right artificial knee joint: Secondary | ICD-10-CM | POA: Diagnosis not present

## 2023-08-27 DIAGNOSIS — M25569 Pain in unspecified knee: Secondary | ICD-10-CM | POA: Diagnosis not present

## 2023-08-28 DIAGNOSIS — J9611 Chronic respiratory failure with hypoxia: Secondary | ICD-10-CM | POA: Diagnosis not present

## 2023-08-28 DIAGNOSIS — Z87891 Personal history of nicotine dependence: Secondary | ICD-10-CM | POA: Diagnosis not present

## 2023-08-28 DIAGNOSIS — J432 Centrilobular emphysema: Secondary | ICD-10-CM | POA: Diagnosis not present

## 2023-08-28 DIAGNOSIS — J449 Chronic obstructive pulmonary disease, unspecified: Secondary | ICD-10-CM | POA: Diagnosis not present

## 2023-09-04 DIAGNOSIS — M25561 Pain in right knee: Secondary | ICD-10-CM | POA: Diagnosis not present

## 2023-09-04 DIAGNOSIS — M25569 Pain in unspecified knee: Secondary | ICD-10-CM | POA: Diagnosis not present

## 2023-09-04 DIAGNOSIS — M170 Bilateral primary osteoarthritis of knee: Secondary | ICD-10-CM | POA: Diagnosis not present

## 2023-09-04 DIAGNOSIS — M25562 Pain in left knee: Secondary | ICD-10-CM | POA: Diagnosis not present

## 2023-09-04 DIAGNOSIS — Z96651 Presence of right artificial knee joint: Secondary | ICD-10-CM | POA: Diagnosis not present

## 2023-09-11 DIAGNOSIS — M25562 Pain in left knee: Secondary | ICD-10-CM | POA: Diagnosis not present

## 2023-09-11 DIAGNOSIS — Z96651 Presence of right artificial knee joint: Secondary | ICD-10-CM | POA: Diagnosis not present

## 2023-09-11 DIAGNOSIS — M25569 Pain in unspecified knee: Secondary | ICD-10-CM | POA: Diagnosis not present

## 2023-09-11 DIAGNOSIS — M25561 Pain in right knee: Secondary | ICD-10-CM | POA: Diagnosis not present

## 2023-09-11 DIAGNOSIS — M170 Bilateral primary osteoarthritis of knee: Secondary | ICD-10-CM | POA: Diagnosis not present

## 2023-09-18 DIAGNOSIS — M170 Bilateral primary osteoarthritis of knee: Secondary | ICD-10-CM | POA: Diagnosis not present

## 2023-09-18 DIAGNOSIS — M25561 Pain in right knee: Secondary | ICD-10-CM | POA: Diagnosis not present

## 2023-09-18 DIAGNOSIS — M25562 Pain in left knee: Secondary | ICD-10-CM | POA: Diagnosis not present

## 2023-09-18 DIAGNOSIS — M25569 Pain in unspecified knee: Secondary | ICD-10-CM | POA: Diagnosis not present

## 2023-09-18 DIAGNOSIS — Z96651 Presence of right artificial knee joint: Secondary | ICD-10-CM | POA: Diagnosis not present

## 2023-09-19 DIAGNOSIS — Z96651 Presence of right artificial knee joint: Secondary | ICD-10-CM | POA: Diagnosis not present

## 2023-09-19 DIAGNOSIS — M7061 Trochanteric bursitis, right hip: Secondary | ICD-10-CM | POA: Diagnosis not present

## 2023-09-23 DIAGNOSIS — K6389 Other specified diseases of intestine: Secondary | ICD-10-CM | POA: Diagnosis not present

## 2023-09-23 DIAGNOSIS — E039 Hypothyroidism, unspecified: Secondary | ICD-10-CM | POA: Diagnosis not present

## 2023-09-23 DIAGNOSIS — I10 Essential (primary) hypertension: Secondary | ICD-10-CM | POA: Diagnosis not present

## 2023-09-23 DIAGNOSIS — F419 Anxiety disorder, unspecified: Secondary | ICD-10-CM | POA: Diagnosis not present

## 2023-09-23 DIAGNOSIS — K635 Polyp of colon: Secondary | ICD-10-CM | POA: Diagnosis not present

## 2023-09-23 DIAGNOSIS — K5730 Diverticulosis of large intestine without perforation or abscess without bleeding: Secondary | ICD-10-CM | POA: Diagnosis not present

## 2023-09-23 DIAGNOSIS — J449 Chronic obstructive pulmonary disease, unspecified: Secondary | ICD-10-CM | POA: Diagnosis not present

## 2023-09-23 DIAGNOSIS — D123 Benign neoplasm of transverse colon: Secondary | ICD-10-CM | POA: Diagnosis not present

## 2023-09-23 DIAGNOSIS — Z87891 Personal history of nicotine dependence: Secondary | ICD-10-CM | POA: Diagnosis not present

## 2023-09-23 DIAGNOSIS — K529 Noninfective gastroenteritis and colitis, unspecified: Secondary | ICD-10-CM | POA: Diagnosis not present

## 2023-09-23 DIAGNOSIS — Z1211 Encounter for screening for malignant neoplasm of colon: Secondary | ICD-10-CM | POA: Diagnosis not present

## 2023-09-23 DIAGNOSIS — Z79899 Other long term (current) drug therapy: Secondary | ICD-10-CM | POA: Diagnosis not present

## 2023-09-24 DIAGNOSIS — Z1211 Encounter for screening for malignant neoplasm of colon: Secondary | ICD-10-CM | POA: Diagnosis not present

## 2023-09-24 DIAGNOSIS — D123 Benign neoplasm of transverse colon: Secondary | ICD-10-CM | POA: Diagnosis not present

## 2023-09-27 DIAGNOSIS — Z0000 Encounter for general adult medical examination without abnormal findings: Secondary | ICD-10-CM | POA: Diagnosis not present

## 2023-09-27 DIAGNOSIS — Z135 Encounter for screening for eye and ear disorders: Secondary | ICD-10-CM | POA: Diagnosis not present

## 2023-09-27 DIAGNOSIS — F330 Major depressive disorder, recurrent, mild: Secondary | ICD-10-CM | POA: Diagnosis not present

## 2023-09-27 DIAGNOSIS — F5104 Psychophysiologic insomnia: Secondary | ICD-10-CM | POA: Diagnosis not present

## 2023-09-27 DIAGNOSIS — F411 Generalized anxiety disorder: Secondary | ICD-10-CM | POA: Diagnosis not present

## 2023-09-27 DIAGNOSIS — J9611 Chronic respiratory failure with hypoxia: Secondary | ICD-10-CM | POA: Diagnosis not present

## 2023-09-27 DIAGNOSIS — J432 Centrilobular emphysema: Secondary | ICD-10-CM | POA: Diagnosis not present

## 2023-10-02 DIAGNOSIS — Z23 Encounter for immunization: Secondary | ICD-10-CM | POA: Diagnosis not present

## 2023-10-10 ENCOUNTER — Telehealth (HOSPITAL_BASED_OUTPATIENT_CLINIC_OR_DEPARTMENT_OTHER): Payer: Self-pay

## 2023-10-10 DIAGNOSIS — R0602 Shortness of breath: Secondary | ICD-10-CM | POA: Diagnosis not present

## 2023-10-10 DIAGNOSIS — E7849 Other hyperlipidemia: Secondary | ICD-10-CM | POA: Diagnosis not present

## 2023-10-10 DIAGNOSIS — I10 Essential (primary) hypertension: Secondary | ICD-10-CM | POA: Diagnosis not present

## 2023-10-10 DIAGNOSIS — J9611 Chronic respiratory failure with hypoxia: Secondary | ICD-10-CM | POA: Diagnosis not present

## 2023-10-10 NOTE — Telephone Encounter (Signed)
 Per pt chart notes she has transferred care to [facility] in [place] [phone]. I spoke with pt she states she has transferred care and she went to their office this morning to tell them about machine not working and they told her to call here. I explained to pt that we would have to have a current OV and qualifying walk to get a new POC. She states she will come into do a walk but she is not going to continue to be seen here after that. I looked in chart notes and they make mention of her O2 use at her last visit at [facility]. Pt asked me to call [facility] and explain to them what is going on. I spoke with front desk staff who state they did tell her she needed to reach out to who wrote the original order for POC. I explained to her that since they are maintaining her care now it would be them who needed to order new POC. She states she will reach out to pt.      Copied from CRM [phone]. Topic: Clinical - Order For Equipment >> Oct 10, 2023 11:08 AM Nathanel DEL wrote: Reason for CRM: pt states her POC is not working.  It cuts on and off.  Not working right at all.  However pt has not been seen since 03/2021.  She is wanting a replacement. Pt uses [facility].

## 2023-10-18 DIAGNOSIS — Z8616 Personal history of COVID-19: Secondary | ICD-10-CM | POA: Diagnosis not present

## 2023-10-18 DIAGNOSIS — Z7951 Long term (current) use of inhaled steroids: Secondary | ICD-10-CM | POA: Diagnosis not present

## 2023-10-18 DIAGNOSIS — F411 Generalized anxiety disorder: Secondary | ICD-10-CM | POA: Diagnosis not present

## 2023-10-18 DIAGNOSIS — I25119 Atherosclerotic heart disease of native coronary artery with unspecified angina pectoris: Secondary | ICD-10-CM | POA: Diagnosis not present

## 2023-10-18 DIAGNOSIS — Z9981 Dependence on supplemental oxygen: Secondary | ICD-10-CM | POA: Diagnosis not present

## 2023-10-18 DIAGNOSIS — I2510 Atherosclerotic heart disease of native coronary artery without angina pectoris: Secondary | ICD-10-CM | POA: Diagnosis not present

## 2023-10-18 DIAGNOSIS — E785 Hyperlipidemia, unspecified: Secondary | ICD-10-CM | POA: Diagnosis not present

## 2023-10-18 DIAGNOSIS — I25118 Atherosclerotic heart disease of native coronary artery with other forms of angina pectoris: Secondary | ICD-10-CM | POA: Diagnosis not present

## 2023-10-18 DIAGNOSIS — R0609 Other forms of dyspnea: Secondary | ICD-10-CM | POA: Diagnosis not present

## 2023-10-18 DIAGNOSIS — Z87891 Personal history of nicotine dependence: Secondary | ICD-10-CM | POA: Diagnosis not present

## 2023-10-18 DIAGNOSIS — Z79899 Other long term (current) drug therapy: Secondary | ICD-10-CM | POA: Diagnosis not present

## 2023-10-18 DIAGNOSIS — J449 Chronic obstructive pulmonary disease, unspecified: Secondary | ICD-10-CM | POA: Diagnosis not present

## 2023-10-18 DIAGNOSIS — J9611 Chronic respiratory failure with hypoxia: Secondary | ICD-10-CM | POA: Diagnosis not present

## 2023-10-18 DIAGNOSIS — I2584 Coronary atherosclerosis due to calcified coronary lesion: Secondary | ICD-10-CM | POA: Diagnosis not present

## 2023-10-18 DIAGNOSIS — I25110 Atherosclerotic heart disease of native coronary artery with unstable angina pectoris: Secondary | ICD-10-CM | POA: Diagnosis not present

## 2023-10-18 DIAGNOSIS — I272 Pulmonary hypertension, unspecified: Secondary | ICD-10-CM | POA: Diagnosis not present

## 2023-10-18 DIAGNOSIS — I10 Essential (primary) hypertension: Secondary | ICD-10-CM | POA: Diagnosis not present

## 2023-10-18 DIAGNOSIS — R0789 Other chest pain: Secondary | ICD-10-CM | POA: Diagnosis not present

## 2023-10-18 DIAGNOSIS — R079 Chest pain, unspecified: Secondary | ICD-10-CM | POA: Diagnosis not present

## 2023-10-18 DIAGNOSIS — J432 Centrilobular emphysema: Secondary | ICD-10-CM | POA: Diagnosis not present

## 2023-11-04 DIAGNOSIS — R251 Tremor, unspecified: Secondary | ICD-10-CM | POA: Diagnosis not present

## 2023-11-04 DIAGNOSIS — J9611 Chronic respiratory failure with hypoxia: Secondary | ICD-10-CM | POA: Diagnosis not present

## 2023-11-13 DIAGNOSIS — I2510 Atherosclerotic heart disease of native coronary artery without angina pectoris: Secondary | ICD-10-CM | POA: Diagnosis not present

## 2023-11-13 DIAGNOSIS — R079 Chest pain, unspecified: Secondary | ICD-10-CM | POA: Diagnosis not present

## 2023-11-14 DIAGNOSIS — L299 Pruritus, unspecified: Secondary | ICD-10-CM | POA: Diagnosis not present

## 2023-11-18 DIAGNOSIS — M778 Other enthesopathies, not elsewhere classified: Secondary | ICD-10-CM | POA: Diagnosis not present

## 2023-11-20 DIAGNOSIS — H2513 Age-related nuclear cataract, bilateral: Secondary | ICD-10-CM | POA: Diagnosis not present

## 2023-11-28 DIAGNOSIS — J449 Chronic obstructive pulmonary disease, unspecified: Secondary | ICD-10-CM | POA: Diagnosis not present

## 2023-11-28 DIAGNOSIS — J9611 Chronic respiratory failure with hypoxia: Secondary | ICD-10-CM | POA: Diagnosis not present

## 2023-11-28 DIAGNOSIS — R0609 Other forms of dyspnea: Secondary | ICD-10-CM | POA: Diagnosis not present

## 2023-11-28 DIAGNOSIS — J432 Centrilobular emphysema: Secondary | ICD-10-CM | POA: Diagnosis not present

## 2023-11-28 DIAGNOSIS — I2723 Pulmonary hypertension due to lung diseases and hypoxia: Secondary | ICD-10-CM | POA: Diagnosis not present

## 2023-11-28 DIAGNOSIS — Z87891 Personal history of nicotine dependence: Secondary | ICD-10-CM | POA: Diagnosis not present

## 2023-12-06 DIAGNOSIS — Z111 Encounter for screening for respiratory tuberculosis: Secondary | ICD-10-CM | POA: Diagnosis not present

## 2023-12-16 DIAGNOSIS — B351 Tinea unguium: Secondary | ICD-10-CM | POA: Diagnosis not present

## 2023-12-16 DIAGNOSIS — L603 Nail dystrophy: Secondary | ICD-10-CM | POA: Diagnosis not present
